# Patient Record
Sex: Male | Born: 1965 | Race: White | Hispanic: No | Marital: Married | Smoking: Never smoker
Health system: Southern US, Community
[De-identification: ages and names within clinical notes are randomized; demographics above are authoritative.]

## PROBLEM LIST (undated history)

## (undated) DIAGNOSIS — K760 Fatty (change of) liver, not elsewhere classified: Secondary | ICD-10-CM

## (undated) DIAGNOSIS — F430 Acute stress reaction: Secondary | ICD-10-CM

## (undated) DIAGNOSIS — K219 Gastro-esophageal reflux disease without esophagitis: Secondary | ICD-10-CM

## (undated) DIAGNOSIS — G4733 Obstructive sleep apnea (adult) (pediatric): Secondary | ICD-10-CM

## (undated) DIAGNOSIS — M545 Low back pain, unspecified: Secondary | ICD-10-CM

## (undated) DIAGNOSIS — E785 Hyperlipidemia, unspecified: Secondary | ICD-10-CM

## (undated) DIAGNOSIS — J309 Allergic rhinitis, unspecified: Secondary | ICD-10-CM

## (undated) DIAGNOSIS — E7800 Pure hypercholesterolemia, unspecified: Secondary | ICD-10-CM

## (undated) DIAGNOSIS — D229 Melanocytic nevi, unspecified: Secondary | ICD-10-CM

## (undated) DIAGNOSIS — D171 Benign lipomatous neoplasm of skin and subcutaneous tissue of trunk: Secondary | ICD-10-CM

## (undated) DIAGNOSIS — M109 Gout, unspecified: Secondary | ICD-10-CM

## (undated) DIAGNOSIS — T7840XA Allergy, unspecified, initial encounter: Secondary | ICD-10-CM

## (undated) DIAGNOSIS — H9190 Unspecified hearing loss, unspecified ear: Secondary | ICD-10-CM

## (undated) HISTORY — PX: TONSILLECTOMY: SUR1361

## (undated) HISTORY — DX: Allergic rhinitis, unspecified: J30.9

## (undated) HISTORY — DX: Gastro-esophageal reflux disease without esophagitis: K21.9

## (undated) HISTORY — DX: Allergy, unspecified, initial encounter: T78.40XA

## (undated) HISTORY — DX: Melanocytic nevi, unspecified: D22.9

## (undated) HISTORY — DX: Unspecified hearing loss, unspecified ear: H91.90

## (undated) HISTORY — DX: Low back pain: M54.5

## (undated) HISTORY — PX: OTHER SURGICAL HISTORY: SHX169

## (undated) HISTORY — PX: HERNIA REPAIR: SHX51

## (undated) HISTORY — DX: Benign lipomatous neoplasm of skin and subcutaneous tissue of trunk: D17.1

## (undated) HISTORY — DX: Obstructive sleep apnea (adult) (pediatric): G47.33

## (undated) HISTORY — DX: Acute stress reaction: F43.0

## (undated) HISTORY — DX: Low back pain, unspecified: M54.50

## (undated) HISTORY — PX: WISDOM TOOTH EXTRACTION: SHX21

## (undated) HISTORY — DX: Gout, unspecified: M10.9

## (undated) HISTORY — DX: Hyperlipidemia, unspecified: E78.5

---

## 1999-11-25 DIAGNOSIS — H9190 Unspecified hearing loss, unspecified ear: Secondary | ICD-10-CM

## 1999-11-25 HISTORY — DX: Unspecified hearing loss, unspecified ear: H91.90

## 2004-07-05 ENCOUNTER — Other Ambulatory Visit: Payer: Self-pay

## 2005-04-07 ENCOUNTER — Emergency Department: Payer: Self-pay | Admitting: Emergency Medicine

## 2006-05-15 ENCOUNTER — Emergency Department: Payer: Self-pay | Admitting: Emergency Medicine

## 2006-05-15 ENCOUNTER — Other Ambulatory Visit: Payer: Self-pay

## 2006-07-06 ENCOUNTER — Encounter: Admission: RE | Admit: 2006-07-06 | Discharge: 2006-07-06 | Payer: Self-pay | Admitting: Gastroenterology

## 2007-06-07 DIAGNOSIS — J309 Allergic rhinitis, unspecified: Secondary | ICD-10-CM | POA: Insufficient documentation

## 2008-10-22 DIAGNOSIS — F430 Acute stress reaction: Secondary | ICD-10-CM | POA: Insufficient documentation

## 2009-06-07 ENCOUNTER — Ambulatory Visit: Payer: Self-pay | Admitting: Family Medicine

## 2013-02-16 ENCOUNTER — Ambulatory Visit: Payer: Self-pay | Admitting: Family Medicine

## 2013-04-07 ENCOUNTER — Ambulatory Visit: Payer: Self-pay | Admitting: Family Medicine

## 2014-02-21 LAB — LIPID PANEL
CHOLESTEROL, TOTAL: 175
HDL: 49 mg/dL (ref 35–70)
LDL Cholesterol: 91 mg/dL
Triglycerides: 176

## 2014-05-09 LAB — BASIC METABOLIC PANEL
BUN: 17 mg/dL (ref 4–21)
CREATININE: 1 mg/dL (ref 0.6–1.3)
GLUCOSE: 113 mg/dL
Potassium: 4.3 mmol/L (ref 3.4–5.3)
Sodium: 140 mmol/L (ref 137–147)

## 2014-05-09 LAB — HEPATIC FUNCTION PANEL
ALT: 23 U/L (ref 10–40)
AST: 25 U/L (ref 14–40)

## 2014-08-21 ENCOUNTER — Ambulatory Visit: Payer: Self-pay | Admitting: Family Medicine

## 2014-11-24 DIAGNOSIS — M109 Gout, unspecified: Secondary | ICD-10-CM

## 2014-11-24 HISTORY — DX: Gout, unspecified: M10.9

## 2015-05-07 ENCOUNTER — Telehealth: Payer: Self-pay

## 2015-05-07 ENCOUNTER — Emergency Department
Admission: EM | Admit: 2015-05-07 | Discharge: 2015-05-07 | Disposition: A | Discharge status: Home / Self Care | Payer: BLUE CROSS/BLUE SHIELD | Attending: Emergency Medicine | Admitting: Emergency Medicine

## 2015-05-07 ENCOUNTER — Emergency Department: Payer: BLUE CROSS/BLUE SHIELD

## 2015-05-07 ENCOUNTER — Other Ambulatory Visit: Payer: Self-pay

## 2015-05-07 ENCOUNTER — Encounter: Payer: Self-pay | Admitting: Emergency Medicine

## 2015-05-07 DIAGNOSIS — M25512 Pain in left shoulder: Secondary | ICD-10-CM

## 2015-05-07 HISTORY — DX: Pure hypercholesterolemia, unspecified: E78.00

## 2015-05-07 LAB — COMPREHENSIVE METABOLIC PANEL
ALBUMIN: 3.9 g/dL (ref 3.5–5.0)
ALT: 22 U/L (ref 17–63)
ANION GAP: 7 (ref 5–15)
AST: 26 U/L (ref 15–41)
Alkaline Phosphatase: 35 U/L — ABNORMAL LOW (ref 38–126)
BILIRUBIN TOTAL: 1.1 mg/dL (ref 0.3–1.2)
BUN: 19 mg/dL (ref 6–20)
CALCIUM: 8.9 mg/dL (ref 8.9–10.3)
CHLORIDE: 111 mmol/L (ref 101–111)
CO2: 22 mmol/L (ref 22–32)
CREATININE: 0.89 mg/dL (ref 0.61–1.24)
GFR calc Af Amer: >60 mL/min (ref >60)
GFR calc non Af Amer: >60 mL/min (ref >60)
Glucose, Bld: 102 mg/dL — ABNORMAL HIGH (ref 65–99)
Potassium: 4.6 mmol/L (ref 3.5–5.1)
Sodium: 140 mmol/L (ref 135–145)
TOTAL PROTEIN: 6.6 g/dL (ref 6.5–8.1)

## 2015-05-07 LAB — CBC
HEMATOCRIT: 49.6 % (ref 40.0–52.0)
HEMOGLOBIN: 16.7 g/dL (ref 13.0–18.0)
MCH: 30.8 pg (ref 26.0–34.0)
MCHC: 33.6 g/dL (ref 32.0–36.0)
MCV: 91.7 fL (ref 80.0–100.0)
PLATELETS: 214 10*3/uL (ref 150–440)
RBC: 5.41 MIL/uL (ref 4.40–5.90)
RDW: 12.8 % (ref 11.5–14.5)
WBC: 6.7 10*3/uL (ref 3.8–10.6)

## 2015-05-07 LAB — TROPONIN I: Troponin I: <0.03 ng/mL (ref <0.031)

## 2015-05-07 MED ORDER — HYDROCODONE-ACETAMINOPHEN 5-325 MG PO TABS: 1.0000 | ORAL_TABLET | ORAL | Status: DC | PRN

## 2015-05-07 NOTE — ED Provider Notes (Signed)
[facility] Emergency Department Provider Note  ____________________________________________  Time seen: 1:30 PM  I have reviewed the triage vital signs and the nursing notes.   HISTORY  Chief Complaint Arm Pain      HPI Russell Pineda is a 49 y.o. male who presents with 1 week of left shoulder pain. Denies chest pain. He reports the pain is intermittent but when it comes it is sharp and moderate to severe. He notes it radiates down his arm. He denies neuro deficits. He denies numbness tingling. No fevers chills. No neck pain. Does report he has been working on his house recently. No shortness of breath. No recent travel. No history of DVTs.     Past Medical History  Diagnosis Date  . Hypercholesterolemia     There are no active problems to display for this patient.   Past Surgical History  Procedure Laterality Date  . Tonsillectomy    . Orchidectomy      left    No current outpatient prescriptions on file.  Allergies Review of patient's allergies indicates no known allergies.  No family history on file.  Social History History  Substance Use Topics  . Smoking status: Never Smoker   . Smokeless tobacco: Not on file  . Alcohol Use: 8.4 oz/week    14 Standard drinks or equivalent per week    Review of Systems  Constitutional: Negative for fever. Eyes: Negative for visual changes. ENT: Negative for sore throat Cardiovascular: Negative for chest pain. Respiratory: Negative for shortness of breath. Gastrointestinal: Negative for abdominal pain, vomiting and diarrhea. Genitourinary: Negative for dysuria. Musculoskeletal: Positive for left shoulder pain Skin: Negative for rash. Neurological: Negative for headaches or focal weakness    10-point ROS otherwise negative.  ____________________________________________   PHYSICAL EXAM:  VITAL SIGNS: ED Triage Vitals  Enc Vitals Group     BP 05/07/15 1055 120/83 mmHg     Pulse  Rate 05/07/15 1055 77     Resp 05/07/15 1055 20     Temp 05/07/15 1055 98 F (36.7 C)     Temp Source 05/07/15 1055 Oral     SpO2 05/07/15 1055 95 %     Weight 05/07/15 1055 235 lb (106.595 kg)     Height 05/07/15 1055 6' (1.829 m)     Head Cir --      Peak Flow --      Pain Score 05/07/15 1056 5     Pain Loc --      Pain Edu? --      Excl. in [place]? --      Constitutional: Alert and oriented. Well appearing and in no distress. Eyes: Conjunctivae are normal. PERRL. ENT   Head: Normocephalic and atraumatic.   Nose: No rhinnorhea.   Mouth/Throat: Mucous membranes are moist. Cardiovascular: Normal rate, regular rhythm. Normal and symmetric distal pulses are present in all extremities. No murmurs, rubs, or gallops. Respiratory: Normal respiratory effort without tachypnea nor retractions. Breath sounds are clear and equal bilaterally.  Gastrointestinal: Soft and non-tender in all quadrants. No distention. There is no CVA tenderness. Genitourinary: deferred Musculoskeletal: Nontender with normal range of motion in all extremities. No lower extremity tenderness nor edema. Neurologic:  Normal speech and language. No gross focal neurologic deficits are appreciated. Skin:  Skin is warm, dry and intact. No rash noted.  Psychiatric: Mood and affect are normal. Patient exhibits appropriate insight and judgment.  ____________________________________________    LABS (pertinent positives/negatives)  Labs Reviewed  COMPREHENSIVE  METABOLIC PANEL - Abnormal; Notable for the following:    Glucose, Bld 102 (*)    Alkaline Phosphatase 35 (*)    All other components within normal limits  CBC  TROPONIN I    ____________________________________________   EKG  ED ECG REPORT I, Lavonia Drafts, the attending physician, personally viewed and interpreted this ECG.  Date: 05/07/2015 EKG Time: 10:59 AM Rate: 77 Rhythm: normal sinus rhythm QRS Axis: normal Intervals: normal ST/T Wave  abnormalities: normal Conduction Disutrbances: none Narrative Interpretation: unremarkable   ____________________________________________    RADIOLOGY  Chest x-ray reviewed by me, no acute distress  ____________________________________________   PROCEDURES  Procedure(s) performed: none  Critical Care performed: none  ____________________________________________   INITIAL IMPRESSION / ASSESSMENT AND PLAN / ED COURSE  Pertinent labs & imaging results that were available during my care of the patient were reviewed by me and considered in my medical decision making (see chart for details).  Patient well-appearing. History of present illness exam, EKG, labs not consistent with ACS. No pleurisy no tachycardia. No chest pain at all. This appears to be a primary musculoskeletal issue perhaps related to the rotator cuff. They  will follow up with his PCP in one day. Return precautions discussed  ____________________________________________   FINAL CLINICAL IMPRESSION(S) / ED DIAGNOSES  Final diagnoses:  Shoulder pain, acute, left     Lavonia Drafts, MD 05/07/15 1345

## 2015-05-07 NOTE — Telephone Encounter (Signed)
Patient son contacted Boykin Nearing advising her that his father has been having pain in his shoulder that is radiating down his arm. I called and spoke to pt to triage the situation and get more detail. Patient complains of new onset pain in his left arm that radiates through his back. Patient describes the pain as a sharp stabbing pain. The pain worsened yesterday and patient rated the pain 10/10. Right now patient rates the pain 5/10. Patient states he was light headed yesterday also.  Patient denies chest pain,  shortness of breath, nausea or vomiting. Patient was advised that he should go to the ER for evaluation. Patient agrees and states he will have his son take him who was currently with his at that time.

## 2015-05-07 NOTE — Discharge Instructions (Signed)
Arthritis, Nonspecific °Arthritis is inflammation of a joint. This usually means pain, redness, warmth or swelling are present. One or more joints may be involved. There are a number of types of arthritis. Your caregiver may not be able to tell what type of arthritis you have right away. °CAUSES  °The most common cause of arthritis is the wear and tear on the joint (osteoarthritis). This causes damage to the cartilage, which can break down over time. The knees, hips, back and neck are most often affected by this type of arthritis. °Other types of arthritis and common causes of joint pain include: °· Sprains and other injuries near the joint. Sometimes minor sprains and injuries cause pain and swelling that develop hours later. °· Rheumatoid arthritis. This affects hands, feet and knees. It usually affects both sides of your body at the same time. It is often associated with chronic ailments, fever, weight loss and general weakness. °· Crystal arthritis. Gout and pseudo gout can cause occasional acute severe pain, redness and swelling in the foot, ankle, or knee. °· Infectious arthritis. Bacteria can get into a joint through a break in overlying skin. This can cause infection of the joint. Bacteria and viruses can also spread through the blood and affect your joints. °· Drug, infectious and allergy reactions. Sometimes joints can become mildly painful and slightly swollen with these types of illnesses. °SYMPTOMS  °· Pain is the main symptom. °· Your joint or joints can also be red, swollen and warm or hot to the touch. °· You may have a fever with certain types of arthritis, or even feel overall ill. °· The joint with arthritis will hurt with movement. Stiffness is present with some types of arthritis. °DIAGNOSIS  °Your caregiver will suspect arthritis based on your description of your symptoms and on your exam. Testing may be needed to find the type of arthritis: °· Blood and sometimes urine tests. °· X-ray tests  and sometimes CT or MRI scans. °· Removal of fluid from the joint (arthrocentesis) is done to check for bacteria, crystals or other causes. Your caregiver (or a specialist) will numb the area over the joint with a local anesthetic, and use a needle to remove joint fluid for examination. This procedure is only minimally uncomfortable. °· Even with these tests, your caregiver may not be able to tell what kind of arthritis you have. Consultation with a specialist (rheumatologist) may be helpful. °TREATMENT  °Your caregiver will discuss with you treatment specific to your type of arthritis. If the specific type cannot be determined, then the following general recommendations may apply. °Treatment of severe joint pain includes: °· Rest. °· Elevation. °· Anti-inflammatory medication (for example, ibuprofen) may be prescribed. Avoiding activities that cause increased pain. °· Only take over-the-counter or prescription medicines for pain and discomfort as recommended by your caregiver. °· Cold packs over an inflamed joint may be used for 10 to 15 minutes every hour. Hot packs sometimes feel better, but do not use overnight. Do not use hot packs if you are diabetic without your caregiver's permission. °· A cortisone shot into arthritic joints may help reduce pain and swelling. °· Any acute arthritis that gets worse over the next 1 to 2 days needs to be looked at to be sure there is no joint infection. °Long-term arthritis treatment involves modifying activities and lifestyle to reduce joint stress jarring. This can include weight loss. Also, exercise is needed to nourish the joint cartilage and remove waste. This helps keep the muscles   around the joint strong. °HOME CARE INSTRUCTIONS  °· Do not take aspirin to relieve pain if gout is suspected. This elevates uric acid levels. °· Only take over-the-counter or prescription medicines for pain, discomfort or fever as directed by your caregiver. °· Rest the joint as much as  possible. °· If your joint is swollen, keep it elevated. °· Use crutches if the painful joint is in your leg. °· Drinking plenty of fluids may help for certain types of arthritis. °· Follow your caregiver's dietary instructions. °· Try low-impact exercise such as: °¨ Swimming. °¨ Water aerobics. °¨ Biking. °¨ Walking. °· Morning stiffness is often relieved by a warm shower. °· Put your joints through regular range-of-motion. °SEEK MEDICAL CARE IF:  °· You do not feel better in 24 hours or are getting worse. °· You have side effects to medications, or are not getting better with treatment. °SEEK IMMEDIATE MEDICAL CARE IF:  °· You have a fever. °· You develop severe joint pain, swelling or redness. °· Many joints are involved and become painful and swollen. °· There is severe back pain and/or leg weakness. °· You have loss of bowel or bladder control. °Document Released: 12/18/2004 Document Revised: 02/02/2012 Document Reviewed: 01/03/2009 °[employer]® Patient Information ©2015 [employer]. This information is not intended to replace advice given to you by your health care provider. Make sure you discuss any questions you have with your health care provider. ° °

## 2015-05-07 NOTE — ED Notes (Signed)
Denies injury, states cannot reproduce by moving it, denies SOB, states pain is L arm, shoulder, side and back

## 2015-05-07 NOTE — ED Notes (Signed)
MD Kinner at bedside  

## 2015-05-08 ENCOUNTER — Ambulatory Visit
Admission: RE | Admit: 2015-05-08 | Discharge: 2015-05-08 | Disposition: A | Discharge status: Home / Self Care | Payer: BLUE CROSS/BLUE SHIELD | Source: Ambulatory Visit | Attending: Family Medicine | Admitting: Family Medicine

## 2015-05-08 ENCOUNTER — Encounter: Payer: Self-pay | Admitting: Family Medicine

## 2015-05-08 ENCOUNTER — Encounter: Payer: Self-pay | Admitting: *Deleted

## 2015-05-08 ENCOUNTER — Ambulatory Visit (INDEPENDENT_AMBULATORY_CARE_PROVIDER_SITE_OTHER): Payer: BLUE CROSS/BLUE SHIELD | Admitting: Family Medicine

## 2015-05-08 VITALS — BP 110/76 | HR 80 | Temp 97.2°F | Resp 16 | Wt 241.0 lb

## 2015-05-08 DIAGNOSIS — G4710 Hypersomnia, unspecified: Secondary | ICD-10-CM | POA: Insufficient documentation

## 2015-05-08 DIAGNOSIS — M25512 Pain in left shoulder: Secondary | ICD-10-CM | POA: Insufficient documentation

## 2015-05-08 DIAGNOSIS — M7552 Bursitis of left shoulder: Secondary | ICD-10-CM

## 2015-05-08 DIAGNOSIS — S39012A Strain of muscle, fascia and tendon of lower back, initial encounter: Secondary | ICD-10-CM | POA: Insufficient documentation

## 2015-05-08 DIAGNOSIS — R252 Cramp and spasm: Secondary | ICD-10-CM | POA: Insufficient documentation

## 2015-05-08 DIAGNOSIS — D229 Melanocytic nevi, unspecified: Secondary | ICD-10-CM | POA: Insufficient documentation

## 2015-05-08 DIAGNOSIS — G4733 Obstructive sleep apnea (adult) (pediatric): Secondary | ICD-10-CM | POA: Insufficient documentation

## 2015-05-08 DIAGNOSIS — R079 Chest pain, unspecified: Secondary | ICD-10-CM | POA: Diagnosis not present

## 2015-05-08 DIAGNOSIS — K120 Recurrent oral aphthae: Secondary | ICD-10-CM | POA: Insufficient documentation

## 2015-05-08 DIAGNOSIS — R0683 Snoring: Secondary | ICD-10-CM | POA: Insufficient documentation

## 2015-05-08 DIAGNOSIS — M5442 Lumbago with sciatica, left side: Secondary | ICD-10-CM | POA: Insufficient documentation

## 2015-05-08 DIAGNOSIS — D171 Benign lipomatous neoplasm of skin and subcutaneous tissue of trunk: Secondary | ICD-10-CM | POA: Insufficient documentation

## 2015-05-08 MED ORDER — METHOCARBAMOL 750 MG PO TABS: ORAL_TABLET | ORAL | Status: DC

## 2015-05-08 MED ORDER — NABUMETONE 750 MG PO TABS: 1500.0000 mg | ORAL_TABLET | Freq: Every day | ORAL | Status: DC

## 2015-05-08 NOTE — Progress Notes (Signed)
Patient: Russell Pineda Male    DOB: 1966-07-10   49 y.o.   MRN: [id number] Visit Date: 05/08/2015  Today's Provider: Lelon Huh, MD   Chief Complaint  Patient presents with  . Shoulder Pain    Hospital Follow up   Subjective:    Shoulder Pain  The pain is present in the left shoulder. This is a new problem. Episode onset: 1 week ago. There has been no history of extremity trauma. The problem occurs intermittently. The quality of the pain is described as sharp. Pertinent negatives include no fever or numbness.  Pain sometimes radiates down into his arm. Patient was seen at [facility] ER yesterday for shoulder pain. Labs and imaging including chest XR and abdominal ultrasound were ordered and were normal. Cardiac enzymes and EKG were normal. Patient was determined to be  to a musculoskeletal issue pehraps related to Rotaror cuff. Patient was prescribed Vicodin to help with pain.  Today patient comes instating pain is no better. Patient woke up this morning feeling light headed and nauseous.   Chest pain Pain above was also associated with pain in chest which is not exertional. However there is a strong family history of early CAD and concerned about possibility of underlying cardiac disease.     Lab Results  Component Value Date   CHOL 175 02/21/2014   HDL 49 02/21/2014   LDLCALC 91 02/21/2014   TRIG 176 02/21/2014   family history includes Cancer in his maternal grandmother and paternal grandfather; Emphysema in his mother; Heart attack in his father; Heart disease in his father.  Previous Medications   CHOLINE FENOFIBRATE (FENOFIBRIC ACID) 135 MG CPDR    Take by mouth.   DIAZEPAM (VALIUM) 5 MG TABLET    Take by mouth.   FLUOCINONIDE CREAM (LIDEX) 0.05 %       FLUTICASONE (FLONASE) 50 MCG/ACT NASAL SPRAY    Place into the nose.   HYDROCODONE-ACETAMINOPHEN (NORCO/VICODIN) 5-325 MG PER TABLET    Take 1 tablet by mouth every 4 (four) hours as needed for moderate pain.   NAPROXEN  (NAPROSYN) 500 MG TABLET    Take by mouth.   PANTOPRAZOLE (PROTONIX) 40 MG TABLET    Take by mouth.    Review of Systems  Constitutional: Negative for fever, chills, diaphoresis and fatigue.  Cardiovascular: Negative for chest pain, palpitations and leg swelling.  Gastrointestinal: Positive for nausea. Negative for vomiting.  Musculoskeletal: Positive for myalgias (left shoulder), back pain, neck pain and neck stiffness.  Neurological: Positive for light-headedness. Negative for dizziness, tremors, seizures, syncope, speech difficulty, weakness, numbness and headaches.   Patient Active Problem List   Diagnosis Date Noted  . Atypical chest pain 05/10/2015  . Excessive sleepiness 05/08/2015  . Left-sided low back pain with left-sided sciatica 05/08/2015  . Lipoma of abdominal wall 05/08/2015  . Low back strain 05/08/2015  . Multiple benign nevi 05/08/2015  . OSA (obstructive sleep apnea) 05/08/2015  . Snoring 05/08/2015  . Lumbago 06/07/2009  . Acute stress disorder 10/22/2008  . Allergic rhinitis 06/07/2007  . Esophageal reflux 06/07/2007  . Hyperlipemia, mixed 11/24/2006    History  Substance Use Topics  . Smoking status: Never Smoker   . Smokeless tobacco: Former Systems developer    Types: Chew    Quit date: 11/24/2004  . Alcohol Use: 8.4 oz/week    14 Standard drinks or equivalent per week   Objective:   BP 110/76 mmHg  Pulse 80  Temp(Src) 97.2 F (36.2 C) (Oral)  Resp 16  Wt 241 lb (109.317 kg)  SpO2 96%  Physical Exam  General Appearance:    Alert, cooperative, no distress  Eyes:    PERRL, conjunctiva/corneas clear, EOM's intact       Lungs:     Clear to auscultation bilaterally, respirations unlabored  Heart:    Regular rate and rhythm  Neurologic:   Awake, alert, oriented x 3. No apparent focal neurological           defect.   MS:  Tender along left superior scapula posterior shoulder and left paracervical muscles. FROM neck. Some pain with internal shoulder rotation.         Assessment & Plan:     1. Bursitis, shoulder, left  - DG Shoulder Left; Future - nabumetone (RELAFEN) 750 MG tablet; Take 2 tablets (1,500 mg total) by mouth daily.  Dispense: 30 tablet; Refill: 1 - methocarbamol (ROBAXIN-750) 750 MG tablet; 1-2 tablets every six hours as needed  Dispense: 30 tablet; Refill: 1  2. Chest pain, unspecified chest pain type He and his wife very concerned about possibly underlying cardiac disease as his father had heart attack in his early 13s.  - Ambulatory referral to Cardiology

## 2015-05-10 ENCOUNTER — Encounter: Payer: Self-pay | Admitting: Cardiovascular Disease

## 2015-05-10 ENCOUNTER — Ambulatory Visit (INDEPENDENT_AMBULATORY_CARE_PROVIDER_SITE_OTHER): Payer: BLUE CROSS/BLUE SHIELD | Admitting: Cardiovascular Disease

## 2015-05-10 VITALS — BP 118/84 | HR 74 | Ht 71.0 in | Wt 241.5 lb

## 2015-05-10 DIAGNOSIS — E782 Mixed hyperlipidemia: Secondary | ICD-10-CM

## 2015-05-10 DIAGNOSIS — R0789 Other chest pain: Secondary | ICD-10-CM | POA: Insufficient documentation

## 2015-05-10 NOTE — Assessment & Plan Note (Signed)
He used to be on a statin in the past but not anymore. I definitely think that it would help to know if there is evidence of atherosclerosis. I discussed with him coronary calcium scoring and he wants to proceed with this. If he has evidence of atherosclerosis, then we should consider more aggressive treatment of hyperlipidemia.

## 2015-05-10 NOTE — Patient Instructions (Signed)
Medication Instructions:  Please continue current medications  Labwork: None  Testing/Procedures: Your physician has requested that you have an exercise tolerance test.   You have been scheduled for a CT Cardiac Score in our [facility]  Tuesday, June 21 @ 10:30 (please arrive @ 10:15) There is a one-time fee of $150.00 due at the time of your procedure  Follow-Up: Call or return to clinic prn if these symptoms worsen or fail to improve as anticipated.  Exercise Stress Electrocardiogram An exercise stress electrocardiogram is a test that is done to evaluate the blood supply to your heart. This test may also be called exercise stress electrocardiography. The test is done while you are walking on a treadmill. The goal of this test is to raise your heart rate. This test is done to find areas of poor blood flow to the heart by determining the extent of coronary artery disease (CAD).   CAD is defined as narrowing in one or more heart (coronary) arteries of more than 70%. If you have an abnormal test result, this may mean that you are not getting adequate blood flow to your heart during exercise. Additional testing may be needed to understand why your test was abnormal. LET [facility] CARE PROVIDER KNOW ABOUT:   Any allergies you have.  All medicines you are taking, including vitamins, herbs, eye drops, creams, and over-the-counter medicines.  Previous problems you or members of your family have had with the use of anesthetics.  Any blood disorders you have.  Previous surgeries you have had.  Medical conditions you have.  Possibility of pregnancy, if this applies. RISKS AND COMPLICATIONS Generally, this is a safe procedure. However, as with any procedure, complications can occur. Possible complications can include:  Pain or pressure in the following areas:  Chest.  Jaw or neck.  Between your shoulder blades.  Radiating down your left arm.  Dizziness or  light-headedness.  Shortness of breath.  Increased or irregular heartbeats.  Nausea or vomiting.  Heart attack (rare). BEFORE THE PROCEDURE  Avoid all forms of caffeine 24 hours before your test or as directed by your health care provider. This includes coffee, tea (even decaffeinated tea), caffeinated sodas, chocolate, cocoa, and certain pain medicines.  Follow your health care provider's instructions regarding eating and drinking before the test.  Take your medicines as directed at regular times with water unless instructed otherwise. Exceptions may include:  If you have diabetes, ask how you are to take your insulin or pills. It is common to adjust insulin dosing the morning of the test.  If you are taking beta-blocker medicines, it is important to talk to your health care provider about these medicines well before the date of your test. Taking beta-blocker medicines may interfere with the test. In some cases, these medicines need to be changed or stopped 24 hours or more before the test.  If you wear a nitroglycerin patch, it may need to be removed prior to the test. Ask your health care provider if the patch should be removed before the test.  If you use an inhaler for any breathing condition, bring it with you to the test.  If you are an outpatient, bring a snack so you can eat right after the stress phase of the test.  Do not smoke for 4 hours prior to the test or as directed by your health care provider.  Do not apply lotions, powders, creams, or oils on your chest prior to the test.  Wear loose-fitting clothes  and comfortable shoes for the test. This test involves walking on a treadmill. PROCEDURE  Multiple patches (electrodes) will be put on your chest. If needed, small areas of your chest may have to be shaved to get better contact with the electrodes. Once the electrodes are attached to your body, multiple wires will be attached to the electrodes and your heart rate will  be monitored.  Your heart will be monitored both at rest and while exercising.  You will walk on a treadmill. The treadmill will be started at a slow pace. The treadmill speed and incline will gradually be increased to raise your heart rate. AFTER THE PROCEDURE  Your heart rate and blood pressure will be monitored after the test.  You may return to your normal schedule including diet, activities, and medicines, unless your health care provider tells you otherwise. Document Released: 11/07/2000 Document Revised: 11/15/2013 Document Reviewed: 07/18/2013 [facility] Patient Information 2015 [place], [place]. This information is not intended to replace advice given to you by your health care provider. Make sure you discuss any questions you have with your health care provider.

## 2015-05-10 NOTE — Progress Notes (Signed)
Primary care physician: Dr. Caryn Section.  HPI  This is a pleasant 49 year old male who was referred for evaluation of atypical chest pain. He has no previous cardiac history. He has known history of hyperlipidemia, family history of premature coronary artery disease and obesity. he was previously treated with a statin. His father died at the age of 22 of myocardial infarction and his uncle died also in his 27s of myocardial infarction. The patient is not a smoker. Over the last 2 weeks, he has experienced left shoulder pain occasionally going to the left upper chest area. This is usually worse with certain movements. It does not seem to be related to physical activities. He does complain of chronic exertional dyspnea with no orthopnea, PND or lower extremity edema. He went to the emergency room recently for these complaints. Both chest and shoulder x-rays were negative. He is suspected of having bursitis but given his other associated symptoms and family history, he was referred for further cardiac evaluation.   Allergies  Allergen Reactions  . Niacin     Flushing     Current Outpatient Prescriptions on File Prior to Visit  Medication Sig Dispense Refill  . amitriptyline (ELAVIL) 25 MG tablet Take 25 mg by mouth at bedtime as needed.     . fluocinonide cream (LIDEX) 3.29 % Apply 1 application topically as needed.     . fluticasone (FLONASE) 50 MCG/ACT nasal spray Place into the nose as needed.     . methocarbamol (ROBAXIN-750) 750 MG tablet 1-2 tablets every six hours as needed 30 tablet 1  . nabumetone (RELAFEN) 750 MG tablet Take 2 tablets (1,500 mg total) by mouth daily. 30 tablet 1  . pantoprazole (PROTONIX) 40 MG tablet Take 40 mg by mouth daily.      No current facility-administered medications on file prior to visit.     Past Medical History  Diagnosis Date  . Hypercholesterolemia   . Lipoma of abdominal wall   . Multiple benign nevi   . Hyperlipidemia   . Acute stress disorder     . OSA (obstructive sleep apnea)   . Allergic rhinitis   . Esophageal reflux   . Lumbago      Past Surgical History  Procedure Laterality Date  . Tonsillectomy    . Orchidectomy      left  . Hernia repair       Family History  Problem Relation Age of Onset  . Emphysema Mother   . Heart disease Father   . Heart attack Father   . Cancer Maternal Grandmother     Bone  . Cancer Paternal Grandfather      History   Social History  . Marital Status: Married    Spouse Name: N/A  . Number of Children: N/A  . Years of Education: HS Grad   Occupational History  . Full-Time     [employer]   Social History Main Topics  . Smoking status: Never Smoker   . Smokeless tobacco: Former Systems developer    Types: Chew    Quit date: 11/24/2004  . Alcohol Use: 8.4 oz/week    14 Standard drinks or equivalent per week  . Drug Use: No  . Sexual Activity: Not on file   Other Topics Concern  . Not on file   Social History Narrative     ROS A 10 point review of system was performed. It is negative other than that mentioned in the history of present illness.   PHYSICAL EXAM  BP 118/84 mmHg  Pulse 74  Ht 5\' 11"  (1.803 m)  Wt 241 lb 8 oz (109.544 kg)  BMI 33.70 kg/m2 Constitutional: He is oriented to person, place, and time. He appears well-developed and well-nourished. No distress.  HENT: No nasal discharge.  Head: Normocephalic and atraumatic.  Eyes: Pupils are equal and round.  No discharge. Neck: Normal range of motion. Neck supple. No JVD present. No thyromegaly present.  Cardiovascular: Normal rate, regular rhythm, normal heart sounds. Exam reveals no gallop and no friction rub. No murmur heard.  Pulmonary/Chest: Effort normal and breath sounds normal. No stridor. No respiratory distress. He has no wheezes. He has no rales. He exhibits no tenderness.  Abdominal: Soft. Bowel sounds are normal. He exhibits no distension. There is no tenderness. There is no rebound and no  guarding.  Musculoskeletal: Normal range of motion. He exhibits no edema and no tenderness.  Neurological: He is alert and oriented to person, place, and time. Coordination normal.  Skin: Skin is warm and dry. No rash noted. He is not diaphoretic. No erythema. No pallor.  Psychiatric: He has a normal mood and affect. His behavior is normal. Judgment and thought content normal.       HUO:HFGBM  Rhythm  WITHIN NORMAL LIMITS   ASSESSMENT AND PLAN

## 2015-05-10 NOTE — Assessment & Plan Note (Signed)
The left shoulder pain appears to be musculoskeletal. However, he has associated symptoms that include exertional dyspnea with strong family history of coronary artery disease. Thus, I requested evaluation with a treadmill stress test. I discussed with the patient the importance of lifestyle changes in order to decrease the chance of future coronary artery disease and cardiovascular events. We discussed the importance of controlling risk factors, healthy diet as well as regular exercise. I also explained to him that a normal stress test does not rule out atherosclerosis.

## 2015-05-15 ENCOUNTER — Ambulatory Visit (INDEPENDENT_AMBULATORY_CARE_PROVIDER_SITE_OTHER)
Admission: RE | Admit: 2015-05-15 | Discharge: 2015-05-15 | Disposition: A | Discharge status: Home / Self Care | Payer: BLUE CROSS/BLUE SHIELD | Source: Ambulatory Visit | Attending: Cardiovascular Disease | Admitting: Cardiovascular Disease

## 2015-05-15 DIAGNOSIS — R0789 Other chest pain: Secondary | ICD-10-CM

## 2015-06-05 ENCOUNTER — Other Ambulatory Visit: Payer: Self-pay | Admitting: Family Medicine

## 2015-06-14 ENCOUNTER — Encounter: Payer: BLUE CROSS/BLUE SHIELD | Admitting: Cardiovascular Disease

## 2015-08-28 ENCOUNTER — Ambulatory Visit (INDEPENDENT_AMBULATORY_CARE_PROVIDER_SITE_OTHER): Payer: BLUE CROSS/BLUE SHIELD | Admitting: Family Medicine

## 2015-08-28 ENCOUNTER — Encounter: Payer: Self-pay | Admitting: Family Medicine

## 2015-08-28 VITALS — BP 112/78 | HR 70 | Temp 98.3°F | Resp 16 | Ht 71.5 in | Wt 244.4 lb

## 2015-08-28 DIAGNOSIS — J029 Acute pharyngitis, unspecified: Secondary | ICD-10-CM | POA: Diagnosis not present

## 2015-08-28 DIAGNOSIS — K219 Gastro-esophageal reflux disease without esophagitis: Secondary | ICD-10-CM | POA: Diagnosis not present

## 2015-08-28 DIAGNOSIS — J301 Allergic rhinitis due to pollen: Secondary | ICD-10-CM

## 2015-08-28 MED ORDER — SUCRALFATE 1 G PO TABS: 1.0000 g | ORAL_TABLET | Freq: Three times a day (TID) | ORAL | Status: DC

## 2015-08-28 NOTE — Patient Instructions (Signed)
Increase fluticasone spray to two squirts each nasally daily

## 2015-08-28 NOTE — Progress Notes (Signed)
Subjective:     Patient ID: Russell Pineda, male   DOB: 03-13-1966, 49 y.o.   MRN: [id number]  HPI  Chief Complaint  Patient presents with  . Sore Throat    Patient comes in office today with concerns of sore throat intermittent over the past 3 days. Associated symptoms include: sinus drainage, itchy ears, redness and itching areound neck and pain when swallowing solid food. Patient denies fever or exposure to strep.   Reports he does not feel particularly ill. Has been using fluticasone intermittently and Zyrtec daily. Reports compliance with Protonix. Accompanied by his wife today.   Review of Systems  Constitutional: Negative for fever, chills and fatigue.       Objective:   Physical Exam  Constitutional: He appears well-developed and well-nourished.  Ears: T.M's intact without inflammation Throat: no erythema, tonsils absent Neck: no cervical adenopathy Lungs: clear     Assessment:    1. Pharyngitis? Mediated by allergies and/or nocturnal reflux.  2. Allergic rhinitis due to polle  3. Gastroesophageal reflux disease, esophagitis presence not specified - sucralfate (CARAFATE) 1 G tablet; Take 1 tablet (1 g total) by mouth 4 (four) times daily -  with meals and at bedtime.  Dispense: 28 tablet; Refill: 0    Plan:    Increase fluticasone to daily.Discussed episodic use of PPI's or Zantac once his throat feels better.

## 2015-09-03 ENCOUNTER — Other Ambulatory Visit: Payer: Self-pay | Admitting: Family Medicine

## 2015-10-16 ENCOUNTER — Other Ambulatory Visit: Payer: Self-pay | Admitting: Family Medicine

## 2015-10-16 NOTE — Telephone Encounter (Signed)
Tommi Emery called stating Russell Pineda has a "cold, sinus drainage and a sore throat".   She wants to know if you would call him in an RX for Amoxicillin since he is coming up to [place], [place] tomorrow to be with her family and she has a dad 8 whose immune system is very low.    [facility] [address]    Please let Maudie Mercury know your decision.

## 2015-10-16 NOTE — Telephone Encounter (Signed)
LMOVM for pt to return call 

## 2015-10-16 NOTE — Telephone Encounter (Signed)
Patient wife Renold Don and verbally voiced understanding.

## 2015-10-16 NOTE — Telephone Encounter (Signed)
Sounds like a cold virus which amoxicillin would not help with. Antibiotic would not protect her dad from catching this either. If he feels like he has bacterial infection he can make an appointment with Mikki Santee this afternoon to get checked out.

## 2015-10-17 ENCOUNTER — Ambulatory Visit (INDEPENDENT_AMBULATORY_CARE_PROVIDER_SITE_OTHER): Payer: BLUE CROSS/BLUE SHIELD | Admitting: Family Medicine

## 2015-10-17 ENCOUNTER — Encounter: Payer: Self-pay | Admitting: Family Medicine

## 2015-10-17 VITALS — BP 100/68 | HR 71 | Temp 98.4°F | Resp 16 | Wt 246.0 lb

## 2015-10-17 DIAGNOSIS — J069 Acute upper respiratory infection, unspecified: Secondary | ICD-10-CM

## 2015-10-17 MED ORDER — AMOXICILLIN-POT CLAVULANATE 875-125 MG PO TABS: 1.0000 | ORAL_TABLET | Freq: Two times a day (BID) | ORAL | Status: DC

## 2015-10-17 NOTE — Patient Instructions (Signed)
Continue Nyquil and add Mucinex D. May use Delsym as needed for cough. Start antibiotic if sinuses not improving by Sunday.

## 2015-10-17 NOTE — Progress Notes (Signed)
Subjective:     Patient ID: Russell Pineda, male   DOB: 1966-08-12, 49 y.o.   MRN: [id number]  HPI  Chief Complaint  Patient presents with  . Sinus Problem    Patient comes in office today with concerns of sinus pain and pressure since Sunday 10/14/15. Patient reports that pains located below his eyes, associated symptoms include; bilateral ear pain, post nasal drip and productive cough. Patient has been taking otc Benadryl, Airborne and Nightquil  States his allergies were under control before onset of codl sx on 11/20. No other family members ill at this time.   Review of Systems  Constitutional: Negative for fever and chills.       Objective:   Physical Exam  Constitutional: He appears well-developed and well-nourished. No distress.  Ears: T.M's intact without inflammation Sinuses: mild maxillary sinus tenderness Throat: tonsils absent, mild posterior pharyngeal erythema Neck: no cervical adenopathy Lungs: clear     Assessment:    1. Upper respiratory infection - amoxicillin-clavulanate (AUGMENTIN) 875-125 MG tablet; Take 1 tablet by mouth 2 (two) times daily.  Dispense: 20 tablet; Refill: 0    Plan:    Discussed use of Mucinex D for congestion, Delsym for cough, and continue Nyquil at night. Start antibiotic if sinuses not improving on or about 11/27.

## 2016-02-23 ENCOUNTER — Other Ambulatory Visit: Payer: Self-pay | Admitting: Family Medicine

## 2016-02-26 DIAGNOSIS — H40003 Preglaucoma, unspecified, bilateral: Secondary | ICD-10-CM | POA: Diagnosis not present

## 2016-04-10 ENCOUNTER — Encounter: Payer: Self-pay | Admitting: Physician Assistant

## 2016-04-10 ENCOUNTER — Ambulatory Visit (INDEPENDENT_AMBULATORY_CARE_PROVIDER_SITE_OTHER): Payer: BLUE CROSS/BLUE SHIELD | Admitting: Physician Assistant

## 2016-04-10 VITALS — BP 120/70 | HR 75 | Temp 98.1°F | Resp 16

## 2016-04-10 DIAGNOSIS — M7661 Achilles tendinitis, right leg: Secondary | ICD-10-CM

## 2016-04-10 MED ORDER — ETODOLAC 400 MG PO TABS: 400.0000 mg | ORAL_TABLET | Freq: Two times a day (BID) | ORAL | Status: DC

## 2016-04-10 NOTE — Patient Instructions (Signed)

## 2016-04-10 NOTE — Progress Notes (Signed)
Patient: Russell Pineda Male    DOB: 1966/05/27   50 y.o.   MRN: [id number] Visit Date: 04/10/2016  Today's Provider: Mar Daring, PA-C   Chief Complaint  Patient presents with  . Foot Pain   Subjective:    Foot Pain This is a new (right foot) problem. The current episode started in the past 7 days (Injured his right foot Tuesday. On his.He cannot recall what he didi to it.). The problem occurs constantly. The problem has been gradually worsening. Associated symptoms include arthralgias (right heel/ankle) and joint swelling. Pertinent negatives include no numbness or weakness. Associated symptoms comments: Pain is sharp and burning. The symptoms are aggravated by walking and standing (Moving). He has tried nothing (Keeping it elevated) for the symptoms. The treatment provided no relief.       Allergies  Allergen Reactions  . Niacin     Flushing   Previous Medications   AMITRIPTYLINE (ELAVIL) 25 MG TABLET    TAKE 1 TABLET EVERY DAY   AMOXICILLIN-CLAVULANATE (AUGMENTIN) 875-125 MG TABLET    Take 1 tablet by mouth 2 (two) times daily.   CETIRIZINE (ZYRTEC) 10 MG TABLET    Take 10 mg by mouth daily.   FLUOCINONIDE CREAM (LIDEX) 0.05 %    Apply 1 application topically as needed.    FLUTICASONE (FLONASE) 50 MCG/ACT NASAL SPRAY    USE 1 SPRAY IN EACH NOSTRIL DAILY   NABUMETONE (RELAFEN) 750 MG TABLET    TAKE 2 TABLETS (1,500 MG TOTAL) BY MOUTH DAILY.   OMEGA-3 FATTY ACIDS (FISH OIL PO)    Take by mouth daily.   PANTOPRAZOLE (PROTONIX) 40 MG TABLET    Take 40 mg by mouth daily.     Review of Systems  Constitutional: Negative.   Respiratory: Negative.   Cardiovascular: Negative.   Musculoskeletal: Positive for joint swelling, arthralgias (right heel/ankle) and gait problem.  Neurological: Negative for weakness and numbness.    Social History  Substance Use Topics  . Smoking status: Never Smoker   . Smokeless tobacco: Former Systems developer    Types: Chew    Quit date:  11/24/2004  . Alcohol Use: 8.4 oz/week    14 Standard drinks or equivalent per week   Objective:   BP 120/70 mmHg  Pulse 75  Temp(Src) 98.1 F (36.7 C) (Oral)  Resp 16  Wt   Physical Exam  Constitutional: He appears well-developed and well-nourished. No distress.  HENT:  Head: Normocephalic and atraumatic.  Neck: Normal range of motion. Neck supple.  Cardiovascular: Normal rate, regular rhythm and normal heart sounds.  Exam reveals no gallop and no friction rub.   No murmur heard. Pulmonary/Chest: Effort normal and breath sounds normal. No respiratory distress. He has no wheezes. He has no rales.  Musculoskeletal:       Right ankle: He exhibits swelling (over achilles tendon insertion on calcaneous). He exhibits normal range of motion, no ecchymosis, no deformity and normal pulse. Tenderness (calcaneal insertion of achilles). Achilles tendon exhibits pain. Achilles tendon exhibits no defect and normal Thompson's test results.       Left ankle: Normal.  Skin: He is not diaphoretic.  Vitals reviewed.       Assessment & Plan:     1. Achilles tendinitis of right lower extremity Achilles tendinitis at the insertion on the right calcaneus. I do not feel there is a bursitis at this time being that there is no true inflammation noted, just mild swelling. We'll  give Lodine as below for inflammation. Instructed on stretches and exercises to help. I do feel this is secondary to his still toed boots that he has to wear for work. Advised that he may also benefit by putting a heating pad over the area as well. Instructed this can take a long time to heal as it is really aggravated frequently with walking. Discussed that if it does not improve may consider referral to podiatry for further evaluation and treatment. - etodolac (LODINE) 400 MG tablet; Take 1 tablet (400 mg total) by mouth 2 (two) times daily.  Dispense: 28 tablet; Refill: 0       Mar Daring, PA-C  [facility]

## 2016-04-18 ENCOUNTER — Other Ambulatory Visit: Payer: Self-pay | Admitting: Family Medicine

## 2016-04-22 ENCOUNTER — Encounter: Payer: Self-pay | Admitting: Family Medicine

## 2016-04-22 ENCOUNTER — Ambulatory Visit (INDEPENDENT_AMBULATORY_CARE_PROVIDER_SITE_OTHER): Payer: BLUE CROSS/BLUE SHIELD | Admitting: Family Medicine

## 2016-04-22 VITALS — BP 110/74 | HR 76 | Temp 98.4°F | Resp 16 | Wt 248.0 lb

## 2016-04-22 DIAGNOSIS — M10071 Idiopathic gout, right ankle and foot: Secondary | ICD-10-CM | POA: Diagnosis not present

## 2016-04-22 DIAGNOSIS — M109 Gout, unspecified: Secondary | ICD-10-CM

## 2016-04-22 NOTE — Progress Notes (Signed)
Subjective:     Patient ID: Russell Pineda, male   DOB: 08/25/1966, 50 y.o.   MRN: [id number]  HPI  Chief Complaint  Patient presents with  . Foot Pain    Patient returns back to clinic today with complaints of right foot pain. Patient states he was in office on 5/18 with complaints of heel pain and pain at achilles tendon, patient states he was instructed to get inserts placed in his shoes, he states that he also tried Etodolac but he d/c because it made him feel sick. Patient reports pain had gradually got better but now having pain on side of right foot near great toe.   Reports his right first MTP joint was inflamed and very tender for several days starting on 5/26. He took Aleve and it improved some today. Reports that he consumed a case of beer over the 3 day holiday weekend. No personal or family hx of gout   Review of Systems     Objective:   Physical Exam  Constitutional: He appears well-developed and well-nourished. No distress.  Cardiovascular:  Pulses:      Dorsalis pedis pulses are 2+ on the right side.       Posterior tibial pulses are 2+ on the right side.  Musculoskeletal: He exhibits no edema (right lower extremity).  Skin:  Right first MTP mildly swollen, tender and minimally erythematous.        Assessment:    1. 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 Discussed use of ibuprofen 800 mg. 3 x day with food and avoiding triggers like alcohol. Further f/u pending lab work.

## 2016-04-22 NOTE — Patient Instructions (Addendum)
Avoid alcohol. We will call you with the lab results. To help with a flare take ibuprofen 800 mg. 3 x day with food.

## 2016-04-23 ENCOUNTER — Telehealth: Payer: Self-pay | Admitting: Family Medicine

## 2016-04-23 ENCOUNTER — Telehealth: Payer: Self-pay

## 2016-04-23 LAB — CBC WITH DIFFERENTIAL/PLATELET
BASOS: 1 %
Basophils Absolute: 0 10*3/uL (ref 0.0–0.2)
EOS (ABSOLUTE): 0.4 10*3/uL (ref 0.0–0.4)
EOS: 4 %
HEMOGLOBIN: 16.3 g/dL (ref 12.6–17.7)
Hematocrit: 46.3 % (ref 37.5–51.0)
IMMATURE GRANS (ABS): 0 10*3/uL (ref 0.0–0.1)
IMMATURE GRANULOCYTES: 1 %
LYMPHS: 20 %
Lymphocytes Absolute: 1.8 10*3/uL (ref 0.7–3.1)
MCH: 31.5 pg (ref 26.6–33.0)
MCHC: 35.2 g/dL (ref 31.5–35.7)
MCV: 89 fL (ref 79–97)
MONOCYTES: 10 %
Monocytes Absolute: 0.8 10*3/uL (ref 0.1–0.9)
NEUTROS ABS: 5.6 10*3/uL (ref 1.4–7.0)
NEUTROS PCT: 64 %
PLATELETS: 263 10*3/uL (ref 150–379)
RBC: 5.18 x10E6/uL (ref 4.14–5.80)
RDW: 13 % (ref 12.3–15.4)
WBC: 8.7 10*3/uL (ref 3.4–10.8)

## 2016-04-23 LAB — SEDIMENTATION RATE: Sed Rate: 6 mm/hr (ref 0–15)

## 2016-04-23 LAB — RENAL FUNCTION PANEL
Albumin: 4.1 g/dL (ref 3.5–5.5)
BUN / CREAT RATIO: 20 (ref 9–20)
BUN: 17 mg/dL (ref 6–24)
CALCIUM: 9.4 mg/dL (ref 8.7–10.2)
CHLORIDE: 104 mmol/L (ref 96–106)
CO2: 21 mmol/L (ref 18–29)
CREATININE: 0.86 mg/dL (ref 0.76–1.27)
GFR calc Af Amer: 119 mL/min/{1.73_m2} (ref >59)
GFR calc non Af Amer: 103 mL/min/{1.73_m2} (ref >59)
Glucose: 123 mg/dL — ABNORMAL HIGH (ref 65–99)
PHOSPHORUS: 3.3 mg/dL (ref 2.5–4.5)
Potassium: 4.5 mmol/L (ref 3.5–5.2)
SODIUM: 142 mmol/L (ref 134–144)

## 2016-04-23 LAB — URIC ACID: Uric Acid: 9.6 mg/dL — ABNORMAL HIGH (ref 3.7–8.6)

## 2016-04-23 NOTE — Telephone Encounter (Signed)
Patients wife was advised ( she is listed in [place]).KW

## 2016-04-23 NOTE — Telephone Encounter (Signed)
-----   Message from Carmon Ginsberg, [place] sent at 04/23/2016  7:43 AM EDT ----- Your uric acid level was elevated. Let's check it again in two-four weeks after your toe is better and you have been off any triggers (especially alcohol).

## 2016-04-23 NOTE — Telephone Encounter (Signed)
Pt is requesting a call back about is labs.  CB#[phone]/MW

## 2016-04-24 NOTE — Telephone Encounter (Signed)
Advised patient of results. Patient will call in 2-4 weeks to have labs rechecked.     Notes Recorded by Carmon Ginsberg, PA on 04/23/2016 at 7:43 AM Your uric acid level was elevated. Let's check it again in two-four weeks after your toe is better and you have been off any triggers (especially alcohol).

## 2016-04-25 ENCOUNTER — Telehealth: Payer: Self-pay | Admitting: Family Medicine

## 2016-04-25 ENCOUNTER — Other Ambulatory Visit: Payer: Self-pay | Admitting: Family Medicine

## 2016-04-25 DIAGNOSIS — M79676 Pain in unspecified toe(s): Secondary | ICD-10-CM

## 2016-04-25 MED ORDER — PREDNISONE 10 MG PO TABS: ORAL_TABLET | ORAL | Status: DC

## 2016-04-25 NOTE — Telephone Encounter (Signed)
Pt states he came in Tuesday with foot/toe pain.  Pt states this is not any better.  Pt is requesting a Rx to help with the this.  [facility].  CB#[phone]/MW

## 2016-04-25 NOTE — Telephone Encounter (Signed)
States gout has flared up again despite use of high dose ibuprofen and attention to diet. Will try prednisone

## 2016-04-25 NOTE — Telephone Encounter (Signed)
Please review office note and advise. KW 

## 2016-05-01 ENCOUNTER — Ambulatory Visit (INDEPENDENT_AMBULATORY_CARE_PROVIDER_SITE_OTHER): Payer: BLUE CROSS/BLUE SHIELD | Admitting: Family Medicine

## 2016-05-01 ENCOUNTER — Encounter: Payer: Self-pay | Admitting: Family Medicine

## 2016-05-01 VITALS — BP 130/80 | HR 73 | Temp 98.1°F | Resp 16 | Wt 242.0 lb

## 2016-05-01 DIAGNOSIS — M10072 Idiopathic gout, left ankle and foot: Secondary | ICD-10-CM

## 2016-05-01 DIAGNOSIS — M109 Gout, unspecified: Secondary | ICD-10-CM

## 2016-05-01 MED ORDER — COLCHICINE 0.6 MG PO TABS: ORAL_TABLET | ORAL | Status: DC

## 2016-05-01 NOTE — Patient Instructions (Signed)

## 2016-05-01 NOTE — Progress Notes (Signed)
       Patient: Russell Pineda Male    DOB: [birth year]   50 y.o.   MRN: [id number] Visit Date: 05/01/2016  Today's Provider: Lelon Huh, MD   Chief Complaint  Patient presents with  . Foot Pain    Left and Right    Subjective:    Foot Pain This is a recurrent problem. Pertinent negatives include no abdominal pain, chest pain, chills, fever, nausea or vomiting.    Patient has had right foot pain for last 3 weeks. Yesterday left foot began to hurt and does have some swelling. . Patient seen Mariel Sleet last week and found uric acid level  was 9.6. Gout handout provided. Discussed use of ibuprofen 800 mg. 3 x day with food and avoiding triggers like alcohol. Has cut way back on red meats and started eating cherries. Since then pain in right great toe has improved, but worsened on left  Prior to onset of toe pain he was seen by Patrici Ranks for pain in back of heal. He was diagnosed with achilles tendonitis and advised to use shoe insert. He states that the heel pain has resolved.        Allergies  Allergen Reactions  . Niacin     Flushing   Current Meds  Medication Sig  . amitriptyline (ELAVIL) 25 MG tablet TAKE 1 TABLET EVERY DAY  . cetirizine (ZYRTEC) 10 MG tablet Take 10 mg by mouth daily.  . fluocinonide cream (LIDEX) [id number] % Apply 1 application topically as needed.   . fluticasone (FLONASE) 50 MCG/ACT nasal spray USE 1 SPRAY IN EACH NOSTRIL DAILY  . Omega-3 Fatty Acids (FISH OIL PO) Take by mouth daily.  . pantoprazole (PROTONIX) 40 MG tablet TAKE 1 TABLET BY MOUTH EVERY DAY    Review of Systems  Constitutional: Negative for fever, chills and appetite change.  Respiratory: Negative for chest tightness, shortness of breath and wheezing.   Cardiovascular: Negative for chest pain and palpitations.  Gastrointestinal: Negative for nausea, vomiting and abdominal pain.    Social History  Substance Use Topics  . Smoking status: Never Smoker   . Smokeless tobacco: Former Systems developer      Types: Chew    Quit date: 11/24/2004  . Alcohol Use: 8.4 oz/week    14 Standard drinks or equivalent per week   Objective:   BP 130/80 mmHg  Pulse 73  Temp(Src) 98.1 F (36.7 C) (Oral)  Resp 16  Wt 242 lb (109.77 kg)  SpO2 96%  Physical Exam  General appearance: alert, well developed, well nourished, cooperative and in no distress Head: Normocephalic, without obvious abnormality, atraumatic Respiratory: Respirations even and unlabored, normal respiratory rate Extremities: Moderately swollen and tender l MTP of left great toe. Slightly erythematous. Minimal swelling of right first MTP.      Assessment & Plan:     1. Acute gout of left foot, unspecified cause  - colchicine 0.6 MG tablet; 2 tablets today, then one tablet daily  Dispense: 30 tablet; Refill: 1  Call if not quickly improving. Continue dietary changes. Return in July to recheck uric acid and consider allopurinol.       Russell Huh, MD  [facility]

## 2016-05-07 ENCOUNTER — Telehealth: Payer: Self-pay | Admitting: Family Medicine

## 2016-05-07 DIAGNOSIS — M10071 Idiopathic gout, right ankle and foot: Secondary | ICD-10-CM

## 2016-05-07 NOTE — Telephone Encounter (Signed)
Pt called wanting to know when does he need to get his uric acid  labs done again for his gout.  He seen Dr. Caryn Section last week and seen Mikki Santee a couple weeks before that.  He is not clear as to when he needs to follow up. He last a follow up in July with Dr. Caryn Section but he thinks he needs to have labs before that.   His call back is [phone].  Thanks, Russell Pineda

## 2016-05-08 NOTE — Telephone Encounter (Signed)
Patient was notified. Labs ordered and waiting at front desk for pick-up.

## 2016-05-08 NOTE — Telephone Encounter (Signed)
It takes several weeks for uric acid to come down. We should wait until July to check it. We can check when he comes in for o.v., or he can go to lab a few days before o.v. So we have results when he comes in.

## 2016-05-08 NOTE — Telephone Encounter (Signed)
Please review-aa 

## 2016-05-21 ENCOUNTER — Telehealth: Payer: Self-pay | Admitting: Family Medicine

## 2016-05-21 NOTE — Telephone Encounter (Signed)
Pt states Dr Caryn Section advised he will need to have his uric acid rechecked this week.  Pt is requesting a lab slip.  CB#[phone]/MW

## 2016-05-22 NOTE — Telephone Encounter (Signed)
Patient was notified that lab slip is ready to pick-up.

## 2016-05-23 LAB — URIC ACID: Uric Acid: 9.2 mg/dL — ABNORMAL HIGH (ref 3.7–8.6)

## 2016-05-26 ENCOUNTER — Telehealth: Payer: Self-pay

## 2016-05-26 MED ORDER — ALLOPURINOL 100 MG PO TABS: 100.0000 mg | ORAL_TABLET | Freq: Every day | ORAL | Status: DC

## 2016-05-26 NOTE — Telephone Encounter (Signed)
Pt advised; he is going to call back with the pharmacy.  Thanks,   -Mickel Baas

## 2016-05-26 NOTE — Telephone Encounter (Signed)
-----   Message from Birdie Sons, MD sent at 05/24/2016  8:45 PM EDT ----- Uric acid level is still high. Need to start allopurinol 100mg  daily. #30, rf x 1. Follow up in 3 weeks as scheduled.

## 2016-05-28 ENCOUNTER — Other Ambulatory Visit: Payer: Self-pay | Admitting: Family Medicine

## 2016-05-28 MED ORDER — FLUOCINONIDE 0.05 % EX CREA: 1.0000 "application " | TOPICAL_CREAM | CUTANEOUS | Status: DC | PRN

## 2016-05-28 NOTE — Telephone Encounter (Signed)
Pt requesting fluocinonide cream (LIDEX) 0.05 % called into [facility], [place], [place].  He is out of town and left cream at home.

## 2016-06-12 ENCOUNTER — Encounter: Payer: Self-pay | Admitting: Family Medicine

## 2016-06-12 ENCOUNTER — Ambulatory Visit (INDEPENDENT_AMBULATORY_CARE_PROVIDER_SITE_OTHER): Payer: BLUE CROSS/BLUE SHIELD | Admitting: Family Medicine

## 2016-06-12 ENCOUNTER — Telehealth: Payer: Self-pay | Admitting: Family Medicine

## 2016-06-12 VITALS — BP 110/78 | HR 63 | Temp 98.1°F | Resp 16 | Wt 240.0 lb

## 2016-06-12 DIAGNOSIS — E790 Hyperuricemia without signs of inflammatory arthritis and tophaceous disease: Secondary | ICD-10-CM | POA: Diagnosis not present

## 2016-06-12 DIAGNOSIS — M10072 Idiopathic gout, left ankle and foot: Secondary | ICD-10-CM

## 2016-06-12 DIAGNOSIS — M109 Gout, unspecified: Secondary | ICD-10-CM

## 2016-06-12 MED ORDER — INDOMETHACIN 50 MG PO CAPS: 50.0000 mg | ORAL_CAPSULE | Freq: Two times a day (BID) | ORAL | Status: DC

## 2016-06-12 NOTE — Progress Notes (Signed)
       Patient: Russell Pineda Male    DOB: 10/21/1966   50 y.o.   MRN: [id number] Visit Date: 06/12/2016  Today's Provider: Lelon Huh, MD   Chief Complaint  Patient presents with  . Follow-up  . Gout   Subjective:    HPI  Follow upgout of left foot, unspecified cause: From 05/01/2016-started colchicine 0.6 MG tablet x1 qd, but states Colchicine didn't help at all, but pain eventually resolved.  Labs 05/22/2016; showed Uric acid level elevate at 9.2 and started pt on allopurinol 100 mg qd.  However he states started hurting and swelling around big toe yesterday.    Allergies  Allergen Reactions  . Niacin     Flushing   Current Meds  Medication Sig  . allopurinol (ZYLOPRIM) 100 MG tablet Take 1 tablet (100 mg total) by mouth daily.  Marland Kitchen amitriptyline (ELAVIL) 25 MG tablet TAKE 1 TABLET EVERY DAY  . cetirizine (ZYRTEC) 10 MG tablet Take 10 mg by mouth daily.  . fluocinonide cream (LIDEX) [id number] % Apply 1 application topically as needed.  . fluticasone (FLONASE) 50 MCG/ACT nasal spray USE 1 SPRAY IN EACH NOSTRIL DAILY  . Omega-3 Fatty Acids (FISH OIL PO) Take by mouth daily.  . pantoprazole (PROTONIX) 40 MG tablet TAKE 1 TABLET BY MOUTH EVERY DAY  . [DISCONTINUED] etodolac (LODINE) 400 MG tablet Take 1 tablet (400 mg total) by mouth 2 (two) times daily.    Review of Systems  Constitutional: Negative for fever, chills and appetite change.  Respiratory: Negative for chest tightness, shortness of breath and wheezing.   Cardiovascular: Negative for chest pain and palpitations.  Gastrointestinal: Negative for nausea, vomiting and abdominal pain.     Social History  Substance Use Topics  . Smoking status: Never Smoker   . Smokeless tobacco: Former Systems developer    Types: Chew    Quit date: 11/24/2004  . Alcohol Use: 8.4 oz/week    14 Standard drinks or equivalent per week   Objective:   BP 110/78 mmHg  Pulse 63  Temp(Src) 98.1 F (36.7 C) (Oral)  Resp 16  Wt 240 lb (108.863 kg)   SpO2 96%  Physical Exam Mild swelling, redness, and swelling around left first MTP c/w gout.      Assessment & Plan:     1. Acute gout of left foot, unspecified cause He doesn't want to take indomethacin because he doesn't think it helped. He wants to try indomethacin, but states that etodolac upset his stomach. He is taking OTC esomeprazole daily and advised to try taking Pepcid AC before each dose of indomethacin.  - indomethacin (INDOCIN) 50 MG capsule; Take 1 capsule (50 mg total) by mouth 2 (two) times daily with a meal. As needed for gout  Dispense: 30 capsule; Refill: 0   2. 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  - Uric acid      Lelon Huh, MD  [facility]

## 2016-06-12 NOTE — Patient Instructions (Addendum)
Take a dose of Pepcid AC before each dose of indomethacin  Can try Black Cherry Juice extract to reduce gout flares.

## 2016-06-12 NOTE — Telephone Encounter (Signed)
Already been done.

## 2016-06-12 NOTE — Telephone Encounter (Signed)
Per pt wife the Rx for indomethacin (INDOCIN) 50 MG capsule was sent to the wrong pharmacy.  Pt is requesting this resent to [facility].  [id number]

## 2016-06-13 ENCOUNTER — Telehealth: Payer: Self-pay | Admitting: *Deleted

## 2016-06-13 ENCOUNTER — Ambulatory Visit: Payer: BLUE CROSS/BLUE SHIELD | Admitting: Family Medicine

## 2016-06-13 LAB — URIC ACID: Uric Acid: 7.2 mg/dL (ref 3.7–8.6)

## 2016-06-13 MED ORDER — ALLOPURINOL 100 MG PO TABS: 100.0000 mg | ORAL_TABLET | Freq: Two times a day (BID) | ORAL | Status: DC

## 2016-06-13 NOTE — Telephone Encounter (Signed)
Patient notified of results. Patient expressed understanding. Rx sent to pharmacy.  

## 2016-06-13 NOTE — Telephone Encounter (Signed)
-----   Message from Birdie Sons, MD sent at 06/13/2016  8:24 AM EDT ----- Uric acid levels are down from 9.2 to 7.2, recommendations are to get it below 6.5 to best prevent gout flares. Increase allopurinol to 2 x 100mg  daily, #60, rf x 5.

## 2016-07-02 ENCOUNTER — Other Ambulatory Visit: Payer: Self-pay | Admitting: Family Medicine

## 2016-07-02 DIAGNOSIS — M109 Gout, unspecified: Secondary | ICD-10-CM

## 2016-08-07 DIAGNOSIS — J309 Allergic rhinitis, unspecified: Secondary | ICD-10-CM | POA: Diagnosis not present

## 2016-08-07 DIAGNOSIS — K219 Gastro-esophageal reflux disease without esophagitis: Secondary | ICD-10-CM | POA: Diagnosis not present

## 2016-08-07 DIAGNOSIS — J029 Acute pharyngitis, unspecified: Secondary | ICD-10-CM | POA: Diagnosis not present

## 2016-08-08 ENCOUNTER — Ambulatory Visit: Payer: BLUE CROSS/BLUE SHIELD | Admitting: Family Medicine

## 2016-09-17 ENCOUNTER — Other Ambulatory Visit: Payer: Self-pay | Admitting: Family Medicine

## 2016-09-24 ENCOUNTER — Other Ambulatory Visit: Payer: Self-pay | Admitting: *Deleted

## 2016-09-24 MED ORDER — PANTOPRAZOLE SODIUM 40 MG PO TBEC: 40.0000 mg | DELAYED_RELEASE_TABLET | Freq: Every day | ORAL | 4 refills | Status: DC

## 2016-09-24 NOTE — Telephone Encounter (Signed)
Requesting 90 day supply.

## 2016-09-26 ENCOUNTER — Ambulatory Visit (INDEPENDENT_AMBULATORY_CARE_PROVIDER_SITE_OTHER): Payer: BLUE CROSS/BLUE SHIELD | Admitting: Family Medicine

## 2016-09-26 ENCOUNTER — Encounter: Payer: Self-pay | Admitting: Family Medicine

## 2016-09-26 VITALS — BP 102/70 | HR 84 | Temp 98.3°F | Resp 16 | Ht 71.0 in | Wt 238.0 lb

## 2016-09-26 DIAGNOSIS — E790 Hyperuricemia without signs of inflammatory arthritis and tophaceous disease: Secondary | ICD-10-CM

## 2016-09-26 DIAGNOSIS — E782 Mixed hyperlipidemia: Secondary | ICD-10-CM | POA: Diagnosis not present

## 2016-09-26 DIAGNOSIS — G4733 Obstructive sleep apnea (adult) (pediatric): Secondary | ICD-10-CM | POA: Diagnosis not present

## 2016-09-26 DIAGNOSIS — Z0000 Encounter for general adult medical examination without abnormal findings: Secondary | ICD-10-CM

## 2016-09-26 NOTE — Patient Instructions (Addendum)
We will call you with the lab results and when the form is completed. Consider seeing an eye doctor about your eye sensitivity.Consider trying Zantac otc 75 mg.two pills daily instead of protonix.

## 2016-09-26 NOTE — Progress Notes (Signed)
Subjective:     Patient ID: Russell Pineda, male   DOB: 02-09-1966, 50 y.o.   MRN: [id number]  HPI  Chief Complaint  Patient presents with  . Annual Exam    Last Cpe- 10/29/2012. Refuses flu vaccine.  States he is feeling well. Has biometric form to complete.   Review of Systems General: Feeling well HEENT: regular dental visits and recent eye exam. Reports photosensitivity which he attributes to one of his medications. Asked him to discuss this further with his eye doctor. Chronic tinnitus reported. Cardiovascular: no chest pain, shortness of breath, or palpitations Respiratory: continues with CPAP @11  cm/H2O. States he is not longer sleepy during the day. GI: no heartburn-controlled with medication. No change in bowel habits or blood in the stool GU: nocturia x 0, no change in bladder habits  Psychiatric: not depressed Musculoskeletal: no joint pain. Allopurinol and reduction in consumption of alcohol controlling gout.    Objective:   Physical Exam  Constitutional: He appears well-developed and well-nourished. No distress.  Eyes: PERRLA Neck: no thyromegaly, tenderness or nodules, no cervical adenopathy ENT: TM's intact without inflammation; Tonsils absent Lungs: Clear Heart : RRR without murmur or gallop Abd: bowel sounds present, soft, non-tender, no organomegaly Extremities: no edema      Assessment:    1. OSA (obstructive sleep apnea): continue CPAP  2. Hyperlipemia, mixed - Lipid panel  3. Hyperuricemia - Uric acid  4. Annual physical exam - Comprehensive metabolic panel - HgB [id number]    Plan:    Will complete form when labs available. Discussed trying Zantac for long term use. Discuss photosensitivity with your eye doctor.

## 2016-09-27 LAB — HEMOGLOBIN A1C
ESTIMATED AVERAGE GLUCOSE: 108 mg/dL
Hgb A1c MFr Bld: 5.4 % (ref 4.8–5.6)

## 2016-09-27 LAB — COMPREHENSIVE METABOLIC PANEL
A/G RATIO: 2.1 (ref 1.2–2.2)
ALK PHOS: 38 IU/L — AB (ref 39–117)
ALT: 32 IU/L (ref 0–44)
AST: 27 IU/L (ref 0–40)
Albumin: 4.6 g/dL (ref 3.5–5.5)
BUN/Creatinine Ratio: 16 (ref 9–20)
BUN: 14 mg/dL (ref 6–24)
Bilirubin Total: 0.6 mg/dL (ref 0.0–1.2)
CHLORIDE: 102 mmol/L (ref 96–106)
CO2: 20 mmol/L (ref 18–29)
CREATININE: 0.87 mg/dL (ref 0.76–1.27)
Calcium: 9.5 mg/dL (ref 8.7–10.2)
GFR calc Af Amer: 118 mL/min/{1.73_m2} (ref >59)
GFR calc non Af Amer: 102 mL/min/{1.73_m2} (ref >59)
GLOBULIN, TOTAL: 2.2 g/dL (ref 1.5–4.5)
Glucose: 102 mg/dL — ABNORMAL HIGH (ref 65–99)
POTASSIUM: 4.4 mmol/L (ref 3.5–5.2)
SODIUM: 140 mmol/L (ref 134–144)
Total Protein: 6.8 g/dL (ref 6.0–8.5)

## 2016-09-27 LAB — LIPID PANEL
CHOLESTEROL TOTAL: 221 mg/dL — AB (ref 100–199)
Chol/HDL Ratio: 4.7 ratio units (ref 0.0–5.0)
HDL: 47 mg/dL (ref >39)
LDL Calculated: 127 mg/dL — ABNORMAL HIGH (ref 0–99)
TRIGLYCERIDES: 235 mg/dL — AB (ref 0–149)
VLDL Cholesterol Cal: 47 mg/dL — ABNORMAL HIGH (ref 5–40)

## 2016-09-27 LAB — URIC ACID: Uric Acid: 5.8 mg/dL (ref 3.7–8.6)

## 2016-09-29 ENCOUNTER — Telehealth: Payer: Self-pay

## 2016-09-29 NOTE — Telephone Encounter (Signed)
-----   Message from Carmon Ginsberg, [place] sent at 09/29/2016  7:45 AM EST ----- Labs look ok. Mildly elevated cholesterol but your calculated 10 year risk for developing cardiovascular disease is low at 2.4%. Your uric acid levels are well controlled with medication. Your biometric form will be up front for pickup.

## 2016-09-29 NOTE — Telephone Encounter (Signed)
Advised pt of lab results. Pt verbally acknowledges understanding. Emily Drozdowski, CMA   

## 2016-10-28 ENCOUNTER — Encounter: Payer: Self-pay | Admitting: Family Medicine

## 2016-10-28 ENCOUNTER — Ambulatory Visit (INDEPENDENT_AMBULATORY_CARE_PROVIDER_SITE_OTHER): Payer: BLUE CROSS/BLUE SHIELD | Admitting: Family Medicine

## 2016-10-28 ENCOUNTER — Ambulatory Visit
Admission: RE | Admit: 2016-10-28 | Discharge: 2016-10-28 | Disposition: A | Discharge status: Home / Self Care | Payer: BLUE CROSS/BLUE SHIELD | Source: Ambulatory Visit | Attending: Family Medicine | Admitting: Family Medicine

## 2016-10-28 ENCOUNTER — Telehealth: Payer: Self-pay

## 2016-10-28 VITALS — BP 100/62 | HR 66 | Temp 98.0°F | Resp 16 | Wt 240.2 lb

## 2016-10-28 DIAGNOSIS — R0789 Other chest pain: Secondary | ICD-10-CM | POA: Insufficient documentation

## 2016-10-28 MED ORDER — MELOXICAM 15 MG PO TABS: 15.0000 mg | ORAL_TABLET | Freq: Every day | ORAL | 0 refills | Status: DC

## 2016-10-28 NOTE — Telephone Encounter (Signed)
-----   Message from Carmon Ginsberg, [place] sent at 10/28/2016  1:43 PM EST ----- X-ray ok

## 2016-10-28 NOTE — Patient Instructions (Signed)
Discussed use of heating pad for 20 minutes a 2-3 x day. We will call you with the x-ray report.

## 2016-10-28 NOTE — Telephone Encounter (Signed)
Patient was advised. KW 

## 2016-10-28 NOTE — Progress Notes (Signed)
Subjective:     Patient ID: Russell Pineda, male   DOB: 12-Mar-1966, 50 y.o.   MRN: [id number]  HPI  Chief Complaint  Patient presents with  . Back Pain    Patient comes in office today with concerns of back pain on the upper right side of his back for more than two weeks. Patient states that pain is described as dull ache and when moving is described as a sharp pain that takes his breath away. Patient denies any heavy lifting or injury that could have triggered pain, patient has been taking otc Ibuprofen for relief.   Further discussion with his wife reveals a lot of lifting at home which may have initially triggered his pain. Exacerbated by movement,cough, or sneezing. Ibuprofen 800 mg.at bedtime and hot showers seem to help.   Review of Systems  Respiratory: Negative for shortness of breath.        Objective:   Physical Exam  Constitutional: He appears well-developed and well-nourished. No distress.  Pulmonary/Chest: Breath sounds normal. He exhibits tenderness (right posterior lateral chest wall).       Assessment:    1. Chest wall pain - DG Chest 2 View; Future - meloxicam (MOBIC) 15 MG tablet; Take 1 tablet (15 mg total) by mouth daily.  Dispense: 30 tablet; Refill: 0    Plan:   Discussed use of heat for 20 minutes. Further f/u pending x-ray report.

## 2016-11-15 ENCOUNTER — Other Ambulatory Visit: Payer: Self-pay | Admitting: Family Medicine

## 2016-11-24 ENCOUNTER — Other Ambulatory Visit: Payer: Self-pay | Admitting: Family Medicine

## 2016-11-24 DIAGNOSIS — R0789 Other chest pain: Secondary | ICD-10-CM

## 2016-12-01 ENCOUNTER — Other Ambulatory Visit: Payer: Self-pay

## 2016-12-01 MED ORDER — FLUTICASONE PROPIONATE 50 MCG/ACT NA SUSP: 1.0000 | Freq: Every day | NASAL | 3 refills | Status: DC

## 2016-12-01 NOTE — Telephone Encounter (Signed)
Pt called to see if fluticasone (FLONASE) 50 MCG/ACT nasal spray had been sent to pharmacy. I advised refill request can take 24 to 48 hours. Please advise. Thanks TNP

## 2016-12-01 NOTE — Telephone Encounter (Signed)
Pharmacy requesting refills. Pharmacy listed is correct. Thanks!

## 2016-12-09 ENCOUNTER — Ambulatory Visit (INDEPENDENT_AMBULATORY_CARE_PROVIDER_SITE_OTHER): Payer: BLUE CROSS/BLUE SHIELD | Admitting: Family Medicine

## 2016-12-09 ENCOUNTER — Encounter: Payer: Self-pay | Admitting: Family Medicine

## 2016-12-09 VITALS — BP 114/70 | HR 87 | Temp 98.7°F | Resp 16 | Wt 245.6 lb

## 2016-12-09 DIAGNOSIS — J0100 Acute maxillary sinusitis, unspecified: Secondary | ICD-10-CM | POA: Diagnosis not present

## 2016-12-09 MED ORDER — AMOXICILLIN-POT CLAVULANATE 875-125 MG PO TABS: 1.0000 | ORAL_TABLET | Freq: Two times a day (BID) | ORAL | 0 refills | Status: DC

## 2016-12-09 NOTE — Patient Instructions (Signed)
Resume Mucinex D for congestion and Delsym for cough. Try saline nasal spray to humidify and ease irritation.

## 2016-12-09 NOTE — Progress Notes (Signed)
Subjective:     Patient ID: Russell Pineda, male   DOB: 1966-07-18, 51 y.o.   MRN: [id number]  HPI  Chief Complaint  Patient presents with  . Cough    Patient comes in office today with concerns of cough and congestion for the past 3 weeks, patient reports produtive cough began 2 weeks ago. Associates ymptoms include: runny nose, nasal cavity burning and post nasal drainage. Patient has taken otc Mucinex D and Flonase  Patient reports increased sinus pressure, purulent sinus drainage, post nasal drainage and accompanying cough   Review of Systems     Objective:   Physical Exam  Constitutional: He appears well-developed and well-nourished. No distress.  Ears: T.M's intact without inflammation Sinuses: mild maxillary sinus tenderness Throat: tonsils absent Neck: no cervical adenopathy Lungs: clear     Assessment:    1. Acute non-recurrent maxillary sinusitis - amoxicillin-clavulanate (AUGMENTIN) 875-125 MG tablet; Take 1 tablet by mouth 2 (two) times daily.  Dispense: 20 tablet; Refill: 0    Plan:   Resume Mucinex D and try saline nasal spray for your sinuses.

## 2016-12-12 ENCOUNTER — Telehealth: Payer: Self-pay | Admitting: Family Medicine

## 2016-12-12 NOTE — Telephone Encounter (Signed)
Please review-aa 

## 2016-12-12 NOTE — Telephone Encounter (Signed)
States sinus drainage is clearing in color and PND has improved. However has continued frontal sinus pressure which triggered a headache last night. Discussed adding Flonase spray. If not improving add a topical nasal decongestant spray for 2-3 days.

## 2016-12-12 NOTE — Telephone Encounter (Signed)
Pt wife called states pt seen Mikki Santee on Tuesday for sinus congestion and cough.  Pt has been taking the Rx that was given, Delsym cough medication and mucinex D but still having sinus congestion and cough.  Pt also has a really bad headache.  Pt wife is asking if there is something else he can take to help with this.  [facility].  [phone]

## 2017-03-06 DIAGNOSIS — H40003 Preglaucoma, unspecified, bilateral: Secondary | ICD-10-CM | POA: Diagnosis not present

## 2017-05-17 ENCOUNTER — Other Ambulatory Visit: Payer: Self-pay | Admitting: Family Medicine

## 2017-07-14 ENCOUNTER — Other Ambulatory Visit: Payer: Self-pay | Admitting: Family Medicine

## 2017-07-14 ENCOUNTER — Ambulatory Visit (INDEPENDENT_AMBULATORY_CARE_PROVIDER_SITE_OTHER): Payer: BLUE CROSS/BLUE SHIELD | Admitting: Family Medicine

## 2017-07-14 ENCOUNTER — Encounter: Payer: Self-pay | Admitting: Family Medicine

## 2017-07-14 ENCOUNTER — Ambulatory Visit: Payer: BLUE CROSS/BLUE SHIELD | Admitting: Family Medicine

## 2017-07-14 VITALS — BP 122/86 | HR 70 | Temp 98.4°F | Resp 16 | Ht 71.5 in | Wt 249.0 lb

## 2017-07-14 DIAGNOSIS — M5126 Other intervertebral disc displacement, lumbar region: Secondary | ICD-10-CM | POA: Insufficient documentation

## 2017-07-14 DIAGNOSIS — G4733 Obstructive sleep apnea (adult) (pediatric): Secondary | ICD-10-CM | POA: Diagnosis not present

## 2017-07-14 DIAGNOSIS — F419 Anxiety disorder, unspecified: Secondary | ICD-10-CM | POA: Diagnosis not present

## 2017-07-14 MED ORDER — VENLAFAXINE HCL ER 75 MG PO CP24: 75.0000 mg | ORAL_CAPSULE | Freq: Every day | ORAL | 0 refills | Status: DC

## 2017-07-14 NOTE — Patient Instructions (Signed)
Let me know if you can't tolerate the medication before your appointment.

## 2017-07-14 NOTE — Progress Notes (Signed)
Subjective:     Patient ID: Russell Pineda, male   DOB: 08-31-1966, 51 y.o.   MRN: [id number]  HPI  Chief Complaint  Patient presents with  . Annual Exam    Patient comes in office today for his annual physical he states that he feels well today but would like to address issues that he has been having over the past 6 months. Patient states that he has been easily distracted and having difficulty concentrating on tasks,patient states that it feels like his " anxiety is kicking in." Patient reports that he follows a well balanced diet even though he is not actively exercising, he sleeps on average 8hrs but reports he still is fatigued.   Discussed that since his physical would not be due until November have elected to focus on his acute issues today. States that he is concerned that he occasionally will forget words particularly when he is in a socially stressful situation talking to a customer or a boss. Reports he has always had trouble with his mind wandering when reading since he was a child. No ADD hx reported. He has been on sertraline in the past but developed dizziness and tinnitus. Also has tried buspirone. Also wishes rx for C-pap filters.   Review of Systems     Objective:   Physical Exam  Constitutional: He appears well-developed and well-nourished. No distress.  Psychiatric: He has a normal mood and affect. His behavior is normal.       Assessment:    1. OSA (obstructive sleep apnea): RX for C-Pap equipment provided  2. Anxiety - venlafaxine XR (EFFEXOR XR) 75 MG 24 hr capsule; Take 1 capsule (75 mg total) by mouth daily with breakfast.  Dispense: 30 capsule; Refill: 0    Plan:    Will f/u in two weeks or will cal sooner if not tolerating medication.

## 2017-07-29 ENCOUNTER — Ambulatory Visit (INDEPENDENT_AMBULATORY_CARE_PROVIDER_SITE_OTHER): Payer: BLUE CROSS/BLUE SHIELD | Admitting: Family Medicine

## 2017-07-29 ENCOUNTER — Encounter: Payer: Self-pay | Admitting: Family Medicine

## 2017-07-29 VITALS — BP 138/84 | HR 72 | Temp 98.6°F | Resp 16 | Wt 247.0 lb

## 2017-07-29 DIAGNOSIS — F419 Anxiety disorder, unspecified: Secondary | ICD-10-CM | POA: Diagnosis not present

## 2017-07-29 MED ORDER — VENLAFAXINE HCL ER 150 MG PO CP24
150.0000 mg | ORAL_CAPSULE | Freq: Every day | ORAL | 0 refills | Status: DC
Start: 2017-07-29 — End: 2017-08-10

## 2017-07-29 NOTE — Progress Notes (Signed)
Subjective:     Patient ID: Russell Pineda, male   DOB: 21-Dec-1965, 51 y.o.   MRN: [id number]  HPI  Chief Complaint  Patient presents with  . Anxiety  States he doesn't see much difference in his concentration or anxiety at current dose. Reports tolerance of the medication but may be making him drowsier than usual.   Review of Systems     Objective:   Physical Exam  Constitutional: He appears well-developed and well-nourished. No distress.  Psychiatric: He has a normal mood and affect. His behavior is normal.       Assessment:    1. Anxiety: will increase dose - venlafaxine XR (EFFEXOR-XR) 150 MG 24 hr capsule; Take 1 capsule (150 mg total) by mouth daily with breakfast.  Dispense: 30 capsule; Refill: 0    Plan:    return in 3 weeks or phone f/u if doing well.

## 2017-07-29 NOTE — Patient Instructions (Signed)
If you are doing well on increased dose of medication we can do a phone follow up. If not doing well or no change come in for the office visit.

## 2017-08-10 ENCOUNTER — Other Ambulatory Visit: Payer: Self-pay | Admitting: Family Medicine

## 2017-08-10 DIAGNOSIS — F419 Anxiety disorder, unspecified: Secondary | ICD-10-CM

## 2017-08-10 NOTE — Telephone Encounter (Signed)
[facility] supply for the following medication. Thanks CC.  venlafaxine XR (EFFEXOR-XR) 150 MG 24 hr capsule  >Take 1 Capsule ( 75 MG total ) by mouth daily with  Breakfast.

## 2017-08-10 NOTE — Telephone Encounter (Signed)
Please review. Thanks!  

## 2017-08-11 MED ORDER — VENLAFAXINE HCL ER 150 MG PO CP24: 150.0000 mg | ORAL_CAPSULE | Freq: Every day | ORAL | 0 refills | Status: DC

## 2017-08-18 ENCOUNTER — Ambulatory Visit (INDEPENDENT_AMBULATORY_CARE_PROVIDER_SITE_OTHER): Payer: BLUE CROSS/BLUE SHIELD | Admitting: Family Medicine

## 2017-08-18 ENCOUNTER — Telehealth: Payer: Self-pay | Admitting: Family Medicine

## 2017-08-18 ENCOUNTER — Encounter: Payer: Self-pay | Admitting: Family Medicine

## 2017-08-18 VITALS — BP 130/86 | HR 85 | Temp 98.7°F | Resp 16 | Wt 274.6 lb

## 2017-08-18 DIAGNOSIS — F419 Anxiety disorder, unspecified: Secondary | ICD-10-CM

## 2017-08-18 MED ORDER — HYDROXYZINE HCL 25 MG PO TABS: 25.0000 mg | ORAL_TABLET | Freq: Three times a day (TID) | ORAL | 0 refills | Status: DC | PRN

## 2017-08-18 NOTE — Telephone Encounter (Signed)
Error/MW °

## 2017-08-18 NOTE — Patient Instructions (Signed)
We will cal you about the referral .Continue current dose of Venlafaxine. Add hydroxyzine for break through symptoms.

## 2017-08-18 NOTE — Progress Notes (Signed)
Subjective:     Patient ID: Russell Pineda, male   DOB: 1966-05-13, 51 y.o.   MRN: [id number]  HPI  Chief Complaint  Patient presents with  . Anxiety    Patient comes in office today to follow up for anxiety, last office visit was 07/29/17 and Effexor-XR was increaed to 150mg  qd. Patient reports symptoms of fatigue but states good compliance and tolerance on medication, patient states that he still get anxious every now and again.   Suggested he may have more benefit over the next two weeks. States his wife does not like him to be on medication. He is agreeable to referral to psychiatry for another opinion about diagnosis and treatment.   Review of Systems     Objective:   Physical Exam  Constitutional: He appears well-developed and well-nourished. No distress.  Psychiatric: He has a normal mood and affect. His behavior is normal.       Assessment:    1. Anxiety: continue venlafaxine - Ambulatory referral to Psychiatry - hydrOXYzine (ATARAX/VISTARIL) 25 MG tablet; Take 1 tablet (25 mg total) by mouth 3 (three) times daily as needed for anxiety.  Dispense: 15 tablet; Refill: 0    Plan:    Further f/u pending psychiatry referral.

## 2017-08-20 ENCOUNTER — Other Ambulatory Visit: Payer: Self-pay | Admitting: Family Medicine

## 2017-08-20 DIAGNOSIS — F419 Anxiety disorder, unspecified: Secondary | ICD-10-CM

## 2017-08-20 MED ORDER — VENLAFAXINE HCL ER 150 MG PO CP24: 150.0000 mg | ORAL_CAPSULE | Freq: Every day | ORAL | 12 refills | Status: DC

## 2017-08-20 NOTE — Telephone Encounter (Signed)
[facility]day supply for the following medication. Thanks CC  venlafaxine XR (EFFEXOR-XR) 150 MG 24 hr capsule  >Take 1 Capsule ( 150 MG total ) by mouth daily with Breakfast.

## 2017-08-26 ENCOUNTER — Other Ambulatory Visit: Payer: Self-pay | Admitting: Family Medicine

## 2017-08-26 DIAGNOSIS — F419 Anxiety disorder, unspecified: Secondary | ICD-10-CM

## 2017-09-01 ENCOUNTER — Other Ambulatory Visit: Payer: Self-pay | Admitting: Family Medicine

## 2017-09-01 DIAGNOSIS — F419 Anxiety disorder, unspecified: Secondary | ICD-10-CM

## 2017-09-01 MED ORDER — VENLAFAXINE HCL ER 150 MG PO CP24: 150.0000 mg | ORAL_CAPSULE | Freq: Every day | ORAL | 3 refills | Status: DC

## 2017-09-01 NOTE — Telephone Encounter (Signed)
[facility] a 90-days supply for the following medication. Thanks CC  venlafaxine XR (EFFEXOR-XR) 150 MG 24 hr capsule  >Take 1 capsule (150 MG Total ) by mouth daily with breakfast.

## 2017-09-08 ENCOUNTER — Ambulatory Visit (INDEPENDENT_AMBULATORY_CARE_PROVIDER_SITE_OTHER): Payer: BLUE CROSS/BLUE SHIELD | Admitting: Psychiatry

## 2017-09-08 ENCOUNTER — Encounter: Payer: Self-pay | Admitting: Psychiatry

## 2017-09-08 VITALS — BP 130/85 | HR 82 | Temp 98.5°F | Wt 246.6 lb

## 2017-09-08 DIAGNOSIS — Z9989 Dependence on other enabling machines and devices: Secondary | ICD-10-CM

## 2017-09-08 DIAGNOSIS — G4733 Obstructive sleep apnea (adult) (pediatric): Secondary | ICD-10-CM | POA: Diagnosis not present

## 2017-09-08 DIAGNOSIS — F411 Generalized anxiety disorder: Secondary | ICD-10-CM | POA: Diagnosis not present

## 2017-09-08 DIAGNOSIS — F410 Panic disorder [episodic paroxysmal anxiety] without agoraphobia: Secondary | ICD-10-CM

## 2017-09-08 MED ORDER — AMITRIPTYLINE HCL 150 MG PO TABS: 150.0000 mg | ORAL_TABLET | Freq: Every day | ORAL | 1 refills | Status: DC

## 2017-09-08 MED ORDER — VENLAFAXINE HCL ER 37.5 MG PO CP24: 37.5000 mg | ORAL_CAPSULE | Freq: Every day | ORAL | 1 refills | Status: DC

## 2017-09-08 MED ORDER — HYDROXYZINE HCL 25 MG PO TABS: 25.0000 mg | ORAL_TABLET | Freq: Three times a day (TID) | ORAL | 1 refills | Status: DC | PRN

## 2017-09-08 NOTE — Progress Notes (Signed)
Psychiatric Initial Adult Assessment   Patient Identification: Russell Pineda MRN:  [id number] Date of Evaluation:  09/08/2017 Referral Source: Carmon Ginsberg PA  Chief Complaint:  " I am anxious ."  Chief Complaint    Establish Care; Anxiety     Visit Diagnosis:    ICD-10-CM   1. Generalized anxiety disorder F41.1 amitriptyline (ELAVIL) 150 MG tablet  2. Panic attacks F41.0 venlafaxine XR (EFFEXOR-XR) 37.5 MG 24 hr capsule    hydrOXYzine (ATARAX/VISTARIL) 25 MG tablet  3. OSA on CPAP G47.33    Z99.89     History of Present Illness:  Russell Pineda is a 54 y old AAM who is employed , lives in [place] , has a hx of anxiety , OSA , HTN, impaired hearing , presented to [facility] today to establish care.  Russell Pineda today reports that he has been having some worsening sx of anxiety, nervousness, difficulty with tasks , concentration and so on since the past 1 yr or so. He feels his anxiety sx are getting worse . He is currently on Effexor xr , started by his outpatient family physician , started a month ago. He does notice some improvement , but not a lot .  He reports hx of panic attacks - happened a few times over the past several years. He did not have any recently. He feels dizzy , chest pain and so on when he has these attacks .  He reports some anxiety issues when he has to speak in public settings , or has to catch a flight and so on.  He reports some stressors -his job changed , he got promoted 6-8 months ago. His responsibilities are more now . He reports he had worsening anxiety sx even before his job changed , so he does not really know if that is why his sx have worsened .  He does report some sleep issues. He takes Elavil 100 mg for sleep and it helps . He however does have time when he has a lot of racing thoughts at night or sleep too much or wake up groggy. He does not follow a set bedtime usually and on weekends its never regular.He does have OSA , on CPAP, states he uses  it regularly.  He reports he was diagnosed with some panic sx in the past - in 2001 or so when his father passed away from a cardiac problem. He at that time had panic attacks and was started on Zoloft and Buspar by his PMD . He however developed some tinnitus and hearing issues and hence it was not continued . He continues to have some hearing problems in [place] ears.  He also has medical issues - hx of painful gouty arthritis - which is currently managed by his PMD.  He denies any SI/HI/perceptual disturbances , manic sx or hx of trauma.   Associated Signs/Symptoms: Depression Symptoms:  fatigue, difficulty concentrating, anxiety, panic attacks, (Hypo) Manic Symptoms:  denies Anxiety Symptoms:  Excessive Worry, Panic Symptoms, Psychotic Symptoms:  denies PTSD Symptoms: Negative  Past Psychiatric History: Hx of anxiety and panic sx - has been treated with antidepressants in the past , as far as 2001 . Denies past hx of suicide attempts. Denies IP admissions .  Previous Psychotropic Medications: Yes zoloft( tinnitus) , buspar , effexor , elavil  Substance Abuse History in the last 12 months:  No.uses alcohol, 12 oz - 2-3 beers per day .  Consequences of Substance Abuse: Negative  Past Medical History:  Past Medical History:  Diagnosis Date  . Acute stress disorder   . Allergic rhinitis   . Esophageal reflux   . Gout 2016  . Hearing loss 2001  . Hypercholesterolemia   . Hyperlipidemia   . Lipoma of abdominal wall   . Lumbago   . Multiple benign nevi   . OSA (obstructive sleep apnea)     Past Surgical History:  Procedure Laterality Date  . HERNIA REPAIR    . orchidectomy     left  . TONSILLECTOMY      Family Psychiatric History: Paternal uncle - alcoholism, maternal grand father - alcoholism  Family History:  Family History  Problem Relation Age of Onset  . Emphysema Mother   . Heart disease Father   . Heart attack Father   . Cancer Maternal Grandmother         Bone  . Cancer Paternal Grandfather   . Alcohol abuse Paternal Uncle   . Alcohol abuse Maternal Grandfather     Social History:   Social History   Social History  . Marital status: Married    Spouse name: N/A  . Number of children: N/A  . Years of education: HS Grad   Occupational History  . Full-Time     [employer]   Social History Main Topics  . Smoking status: Never Smoker  . Smokeless tobacco: Former Systems developer    Types: Chew    Quit date: 11/24/2004  . Alcohol use 21.0 oz/week    21 Cans of beer, 14 Standard drinks or equivalent per week     Comment: occasional  . Drug use: No  . Sexual activity: Not Asked   Other Topics Concern  . None   Social History Narrative  . None    Additional Social History: Patient is married since the past 26 yrs, has two children. He was born in [place], [place], raised by both parents , they both are deceased. He has been working for the same company - [employer] since the past 20 yrs or so, recently promoted to higher post.   Allergies:   Allergies  Allergen Reactions  . Niacin     Flushing    Metabolic Disorder Labs: Lab Results  Component Value Date   HGBA1C 5.4 09/26/2016   No results found for: PROLACTIN Lab Results  Component Value Date   CHOL 221 (H) 09/26/2016   TRIG 235 (H) 09/26/2016   HDL 47 09/26/2016   CHOLHDL 4.7 09/26/2016   LDLCALC 127 (H) 09/26/2016   LDLCALC 91 02/21/2014     Current Medications: Current Outpatient Prescriptions  Medication Sig Dispense Refill  . allopurinol (ZYLOPRIM) 100 MG tablet TAKE 1 TABLET (100 MG TOTAL) BY MOUTH 2 (TWO) TIMES DAILY. 180 tablet 3  . fexofenadine (ALLEGRA) 180 MG tablet Take 180 mg by mouth daily.    . fluocinonide cream (LIDEX) 8.52 % Apply 1 application topically as needed. 30 g 1  . fluticasone (FLONASE) 50 MCG/ACT nasal spray Place 1 spray into both nostrils daily. 16 g 3  . hydrOXYzine (ATARAX/VISTARIL) 25 MG tablet Take 1 tablet (25 mg total) by  mouth 3 (three) times daily as needed for anxiety. Panic sx as well as sleep issues at bedtime 75 tablet 1  . indomethacin (INDOCIN) 50 MG capsule TAKE ONE CAPSULE BY MOUTH TWICE A DAY WITH MEAL AS NEEDED FOR GOUT 30 capsule 3  . ranitidine (ZANTAC) 75 MG tablet Take 75 mg by mouth 2 (two) times daily.    Marland Kitchen  amitriptyline (ELAVIL) 150 MG tablet Take 1 tablet (150 mg total) by mouth at bedtime. 30 tablet 1  . venlafaxine XR (EFFEXOR-XR) 37.5 MG 24 hr capsule Take 1 capsule (37.5 mg total) by mouth daily with breakfast. 30 capsule 1   No current facility-administered medications for this visit.     Neurologic: Headache: No Seizure: No Paresthesias:No  Musculoskeletal: Strength & Muscle Tone: within normal limits Gait & Station: normal Patient leans: N/A  Psychiatric Specialty Exam: Review of Systems  Psychiatric/Behavioral: The patient is nervous/anxious.   All other systems reviewed and are negative.   Blood pressure 130/85, pulse 82, temperature 98.5 F (36.9 C), temperature source Oral, weight 246 lb 9.6 oz (111.9 kg).Body mass index is 33.91 kg/m.  General Appearance: Casual  Eye Contact:  Fair  Speech:  Clear and Coherent  Volume:  Normal  Mood:  Anxious  Affect:  Appropriate  Thought Process:  Goal Directed and Descriptions of Associations: Intact  Orientation:  Full (Time, Place, and Person)  Thought Content:  Logical  Suicidal Thoughts:  No  Homicidal Thoughts:  No  Memory:  Immediate;   Fair Recent;   Fair Remote;   Fair  Judgement:  Fair  Insight:  Fair  Psychomotor Activity:  Normal  Concentration:  Concentration: Fair and Attention Span: Fair  Recall:  [employer] of Knowledge:Fair  Language: Fair  Akathisia:  Yes  Handed:  Right  AIMS (if indicated):  NA  Assets:  Communication Skills Desire for Improvement Financial Resources/Insurance [place] Talents/Skills Transportation  ADL's:  Intact  Cognition: WNL   Sleep:  Almena is a 37 y old CM , who is married ,employed , has a hx of anxiety sx , presented to [facility] , referred by his PMD. He is now on Effexor , but has sexual side effects and wonders what can be done regarding that. He has some stressors at work - recent promotion, otherwise denies any. He does have a past hx of anxiety sx since 2001 or so. He has good social support , is employed , denies any hx of depression or anxiety in his family. He denies SI - is a good candidate for outpatient treatment.  GAD - 7 - 7 PHQ9- 5  Medication management and Plan see below   Anxiety : Will increase Elavil to 150 mg po qhs . Will reduce Effexor XR to 37.5 mg po daily .  Insomnia: Will continue Elavil as noted above OSA on CPAP- discussed to talk to PMD regarding follow up. Discussed sleep hygiene.  For panic attacks: Vistaril 25 mg po tid prn . Refer to Ms.Peacock for therapy.  Discussed leisure activities , exercise .  Will order TSH since that can also affect his mood .  Will get EKG since he is on Elavil.  More than 50 % of the time was spent for psychoeducation and supportive psychotherapy as well as coordination of care.  Follow up in 4 weeks or sooner if needed.  Ursula Alert, MD 10/16/201810:30 AM

## 2017-09-08 NOTE — Patient Instructions (Signed)
Hydroxyzine capsules or tablets What is this medicine? HYDROXYZINE (hye [place] i zeen) is an antihistamine. This medicine is used to treat allergy symptoms. It is also used to treat anxiety and tension. This medicine can be used with other medicines to induce sleep before surgery. This medicine may be used for other purposes; ask your health care provider or pharmacist if you have questions. COMMON BRAND NAME(S): ANX, Atarax, Rezine, Vistaril What should I tell my health care provider before I take this medicine? They need to know if you have any of these conditions: -any chronic illness -difficulty passing urine -glaucoma -heart disease -kidney disease -liver disease -lung disease -an unusual or allergic reaction to hydroxyzine, cetirizine, other medicines, foods, dyes, or preservatives -pregnant or trying to get pregnant -breast-feeding How should I use this medicine? Take this medicine by mouth with a full glass of water. Follow the directions on the prescription label. You may take this medicine with food or on an empty stomach. Take your medicine at regular intervals. Do not take your medicine more often than directed. Talk to your pediatrician regarding the use of this medicine in children. Special care may be needed. While this drug may be prescribed for children as young as 75 years of age for selected conditions, precautions do apply. Patients over 62 years old may have a stronger reaction and need a smaller dose. Overdosage: If you think you have taken too much of this medicine contact a poison control center or emergency room at once. NOTE: This medicine is only for you. Do not share this medicine with others. What if I miss a dose? If you miss a dose, take it as soon as you can. If it is almost time for your next dose, take only that dose. Do not take double or extra doses. What may interact with this medicine? -alcohol -barbiturate medicines for sleep or seizures -medicines for  colds, allergies -medicines for depression, anxiety, or emotional disturbances -medicines for pain -medicines for sleep -muscle relaxants This list may not describe all possible interactions. Give your health care provider a list of all the medicines, herbs, non-prescription drugs, or dietary supplements you use. Also tell them if you smoke, drink alcohol, or use illegal drugs. Some items may interact with your medicine. What should I watch for while using this medicine? Tell your doctor or health care professional if your symptoms do not improve. You may get drowsy or dizzy. Do not drive, use machinery, or do anything that needs mental alertness until you know how this medicine affects you. Do not stand or sit up quickly, especially if you are an older patient. This reduces the risk of dizzy or fainting spells. Alcohol may interfere with the effect of this medicine. Avoid alcoholic drinks. Your mouth may get dry. Chewing sugarless gum or sucking hard candy, and drinking plenty of water may help. Contact your doctor if the problem does not go away or is severe. This medicine may cause dry eyes and blurred vision. If you wear contact lenses you may feel some discomfort. Lubricating drops may help. See your eye doctor if the problem does not go away or is severe. If you are receiving skin tests for allergies, tell your doctor you are using this medicine. What side effects may I notice from receiving this medicine? Side effects that you should report to your doctor or health care professional as soon as possible: -fast or irregular heartbeat -difficulty passing urine -seizures -slurred speech or confusion -tremor Side effects that  usually do not require medical attention (report to your doctor or health care professional if they continue or are bothersome): -constipation -drowsiness -fatigue -headache -stomach upset This list may not describe all possible side effects. Call your doctor for  medical advice about side effects. You may report side effects to FDA at 1-800-FDA-1088. Where should I keep my medicine? Keep out of the reach of children. Store at room temperature between 15 and 30 degrees C (59 and 86 degrees F). Keep container tightly closed. Throw away any unused medicine after the expiration date. NOTE: This sheet is a summary. It may not cover all possible information. If you have questions about this medicine, talk to your doctor, pharmacist, or health care provider.  2018 Elsevier/Gold Standard (2008-03-24 14:50:59) Amitriptyline tablets What is this medicine? AMITRIPTYLINE (a mee TRIP ti leen) is used to treat depression. This medicine may be used for other purposes; ask your health care provider or pharmacist if you have questions. COMMON BRAND NAME(S): Elavil, Vanatrip What should I tell my health care provider before I take this medicine? They need to know if you have any of these conditions: -an alcohol problem -asthma, difficulty breathing -bipolar disorder or schizophrenia -difficulty passing urine, prostate trouble -glaucoma -heart disease or previous heart attack -liver disease -over active thyroid -seizures -thoughts or plans of suicide, a previous suicide attempt, or family history of suicide attempt -an unusual or allergic reaction to amitriptyline, other medicines, foods, dyes, or preservatives -pregnant or trying to get pregnant -breast-feeding How should I use this medicine? Take this medicine by mouth with a drink of water. Follow the directions on the prescription label. You can take the tablets with or without food. Take your medicine at regular intervals. Do not take it more often than directed. Do not stop taking this medicine suddenly except upon the advice of your doctor. Stopping this medicine too quickly may cause serious side effects or your condition may worsen. A special MedGuide will be given to you by the pharmacist with each  prescription and refill. Be sure to read this information carefully each time. Talk to your pediatrician regarding the use of this medicine in children. Special care may be needed. Overdosage: If you think you have taken too much of this medicine contact a poison control center or emergency room at once. NOTE: This medicine is only for you. Do not share this medicine with others. What if I miss a dose? If you miss a dose, take it as soon as you can. If it is almost time for your next dose, take only that dose. Do not take double or extra doses. What may interact with this medicine? Do not take this medicine with any of the following medications: -arsenic trioxide -certain medicines used to regulate abnormal heartbeat or to treat other heart conditions -cisapride -droperidol -halofantrine -linezolid -MAOIs like Carbex, Eldepryl, Marplan, Nardil, and Parnate -methylene blue -other medicines for mental depression -phenothiazines like perphenazine, thioridazine and chlorpromazine -pimozide -probucol -procarbazine -sparfloxacin -St. John's Wort -ziprasidone This medicine may also interact with the following medications: -atropine and related drugs like hyoscyamine, scopolamine, tolterodine and others -barbiturate medicines for inducing sleep or treating seizures, like phenobarbital -cimetidine -disulfiram -ethchlorvynol -thyroid hormones such as levothyroxine This list may not describe all possible interactions. Give your health care provider a list of all the medicines, herbs, non-prescription drugs, or dietary supplements you use. Also tell them if you smoke, drink alcohol, or use illegal drugs. Some items may interact with your medicine. What should  I watch for while using this medicine? Tell your doctor if your symptoms do not get better or if they get worse. Visit your doctor or health care professional for regular checks on your progress. Because it may take several weeks to see the  full effects of this medicine, it is important to continue your treatment as prescribed by your doctor. Patients and their families should watch out for new or worsening thoughts of suicide or depression. Also watch out for sudden changes in feelings such as feeling anxious, agitated, panicky, irritable, hostile, aggressive, impulsive, severely restless, overly excited and hyperactive, or not being able to sleep. If this happens, especially at the beginning of treatment or after a change in dose, call your health care professional. Dennis Bast may get drowsy or dizzy. Do not drive, use machinery, or do anything that needs mental alertness until you know how this medicine affects you. Do not stand or sit up quickly, especially if you are an older patient. This reduces the risk of dizzy or fainting spells. Alcohol may interfere with the effect of this medicine. Avoid alcoholic drinks. Do not treat yourself for coughs, colds, or allergies without asking your doctor or health care professional for advice. Some ingredients can increase possible side effects. Your mouth may get dry. Chewing sugarless gum or sucking hard candy, and drinking plenty of water will help. Contact your doctor if the problem does not go away or is severe. This medicine may cause dry eyes and blurred vision. If you wear contact lenses you may feel some discomfort. Lubricating drops may help. See your eye doctor if the problem does not go away or is severe. This medicine can cause constipation. Try to have a bowel movement at least every 2 to 3 days. If you do not have a bowel movement for 3 days, call your doctor or health care professional. This medicine can make you more sensitive to the sun. Keep out of the sun. If you cannot avoid being in the sun, wear protective clothing and use sunscreen. Do not use sun lamps or tanning beds/booths. What side effects may I notice from receiving this medicine? Side effects that you should report to your  doctor or health care professional as soon as possible: -allergic reactions like skin rash, itching or hives, swelling of the face, lips, or tongue -anxious -breathing problems -changes in vision -confusion -elevated mood, decreased need for sleep, racing thoughts, impulsive behavior -eye pain -fast, irregular heartbeat -feeling faint or lightheaded, falls -feeling agitated, angry, or irritable -fever with increased sweating -hallucination, loss of contact with reality -seizures -stiff muscles -suicidal thoughts or other mood changes -tingling, pain, or numbness in the feet or hands -trouble passing urine or change in the amount of urine -trouble sleeping -unusually weak or tired -vomiting -yellowing of the eyes or skin Side effects that usually do not require medical attention (report to your doctor or health care professional if they continue or are bothersome): -change in sex drive or performance -change in appetite or weight -constipation -dizziness -dry mouth -nausea -tired -tremors -upset stomach This list may not describe all possible side effects. Call your doctor for medical advice about side effects. You may report side effects to FDA at 1-800-FDA-1088. Where should I keep my medicine? Keep out of the reach of children. Store at room temperature between 20 and 25 degrees C (68 and 77 degrees F). Throw away any unused medicine after the expiration date. NOTE: This sheet is a summary. It may not cover  all possible information. If you have questions about this medicine, talk to your doctor, pharmacist, or health care provider.  2018 Elsevier/Gold Standard (2016-04-11 12:14:15) Venlafaxine extended-release capsules What is this medicine? VENLAFAXINE(VEN la fax een) is used to treat depression, anxiety and panic disorder. This medicine may be used for other purposes; ask your health care provider or pharmacist if you have questions. COMMON BRAND NAME(S): Effexor  XR What should I tell my health care provider before I take this medicine? They need to know if you have any of these conditions: -bleeding disorders -glaucoma -heart disease -high blood pressure -high cholesterol -kidney disease -liver disease -low levels of sodium in the blood -mania or bipolar disorder -seizures -suicidal thoughts, plans, or attempt; a previous suicide attempt by you or a family -take medicines that treat or prevent blood clots -thyroid disease -an unusual or allergic reaction to venlafaxine, desvenlafaxine, other medicines, foods, dyes, or preservatives -pregnant or trying to get pregnant -breast-feeding How should I use this medicine? Take this medicine by mouth with a full glass of water. Follow the directions on the prescription label. Do not cut, crush, or chew this medicine. Take it with food. If needed, the capsule may be carefully opened and the entire contents sprinkled on a spoonful of cool applesauce. Swallow the applesauce/pellet mixture right away without chewing and follow with a glass of water to ensure complete swallowing of the pellets. Try to take your medicine at about the same time each day. Do not take your medicine more often than directed. Do not stop taking this medicine suddenly except upon the advice of your doctor. Stopping this medicine too quickly may cause serious side effects or your condition may worsen. A special MedGuide will be given to you by the pharmacist with each prescription and refill. Be sure to read this information carefully each time. Talk to your pediatrician regarding the use of this medicine in children. Special care may be needed. Overdosage: If you think you have taken too much of this medicine contact a poison control center or emergency room at once. NOTE: This medicine is only for you. Do not share this medicine with others. What if I miss a dose? If you miss a dose, take it as soon as you can. If it is almost time  for your next dose, take only that dose. Do not take double or extra doses. What may interact with this medicine? Do not take this medicine with any of the following medications: -certain medicines for fungal infections like fluconazole, itraconazole, ketoconazole, posaconazole, voriconazole -cisapride -desvenlafaxine -dofetilide -dronedarone -duloxetine -levomilnacipran -linezolid -MAOIs like Carbex, Eldepryl, Marplan, Nardil, and Parnate -methylene blue (injected into a vein) -milnacipran -pimozide -thioridazine -ziprasidone This medicine may also interact with the following medications: -amphetamines -aspirin and aspirin-like medicines -certain medicines for depression, anxiety, or psychotic disturbances -certain medicines for migraine headaches like almotriptan, eletriptan, frovatriptan, naratriptan, rizatriptan, sumatriptan, zolmitriptan -certain medicines for sleep -certain medicines that treat or prevent blood clots like dalteparin, enoxaparin, warfarin -cimetidine -clozapine -diuretics -fentanyl -furazolidone -indinavir -isoniazid -lithium -metoprolol -NSAIDS, medicines for pain and inflammation, like ibuprofen or naproxen -other medicines that prolong the QT interval (cause an abnormal heart rhythm) -procarbazine -rasagiline -supplements like St. John's wort, kava kava, valerian -tramadol -tryptophan This list may not describe all possible interactions. Give your health care provider a list of all the medicines, herbs, non-prescription drugs, or dietary supplements you use. Also tell them if you smoke, drink alcohol, or use illegal drugs. Some items may interact with  your medicine. What should I watch for while using this medicine? Tell your doctor if your symptoms do not get better or if they get worse. Visit your doctor or health care professional for regular checks on your progress. Because it may take several weeks to see the full effects of this medicine, it is  important to continue your treatment as prescribed by your doctor. Patients and their families should watch out for new or worsening thoughts of suicide or depression. Also watch out for sudden changes in feelings such as feeling anxious, agitated, panicky, irritable, hostile, aggressive, impulsive, severely restless, overly excited and hyperactive, or not being able to sleep. If this happens, especially at the beginning of treatment or after a change in dose, call your health care professional. This medicine can cause an increase in blood pressure. Check with your doctor for instructions on monitoring your blood pressure while taking this medicine. You may get drowsy or dizzy. Do not drive, use machinery, or do anything that needs mental alertness until you know how this medicine affects you. Do not stand or sit up quickly, especially if you are an older patient. This reduces the risk of dizzy or fainting spells. Alcohol may interfere with the effect of this medicine. Avoid alcoholic drinks. Your mouth may get dry. Chewing sugarless gum, sucking hard candy and drinking plenty of water will help. Contact your doctor if the problem does not go away or is severe. What side effects may I notice from receiving this medicine? Side effects that you should report to your doctor or health care professional as soon as possible: -allergic reactions like skin rash, itching or hives, swelling of the face, lips, or tongue -anxious -breathing problems -confusion -changes in vision -chest pain -confusion -elevated mood, decreased need for sleep, racing thoughts, impulsive behavior -eye pain -fast, irregular heartbeat -feeling faint or lightheaded, falls -feeling agitated, angry, or irritable -hallucination, loss of contact with reality -high blood pressure -loss of balance or coordination -palpitations -redness, blistering, peeling or loosening of the skin, including inside the mouth -restlessness, pacing,  inability to keep still -seizures -stiff muscles -suicidal thoughts or other mood changes -trouble passing urine or change in the amount of urine -trouble sleeping -unusual bleeding or bruising -unusually weak or tired -vomiting Side effects that usually do not require medical attention (report to your doctor or health care professional if they continue or are bothersome): -change in sex drive or performance -change in appetite or weight -constipation -dizziness -dry mouth -headache -increased sweating -nausea -tired This list may not describe all possible side effects. Call your doctor for medical advice about side effects. You may report side effects to FDA at 1-800-FDA-1088. Where should I keep my medicine? Keep out of the reach of children. Store at a controlled temperature between 20 and 25 degrees C (68 degrees and 77 degrees F), in a dry place. Throw away any unused medicine after the expiration date. NOTE: This sheet is a summary. It may not cover all possible information. If you have questions about this medicine, talk to your doctor, pharmacist, or health care provider.  2018 [employer]vier/Gold Standard (2016-04-10 18:38:02)

## 2017-09-16 ENCOUNTER — Other Ambulatory Visit: Payer: Self-pay | Admitting: Family Medicine

## 2017-09-16 NOTE — Telephone Encounter (Signed)
Rx sent to pharmacy 09/08/2017.

## 2017-09-16 NOTE — Telephone Encounter (Signed)
[facility] pharmacy faxed a request for a 90-days supply for the following medication. Thanks CC  venlafaxine XR (EFFEXOR-XR) 37.5 MG 24 hr capsule  >Take 1 capsule ( 150 MG total ) by mouth daily with breakfast.

## 2017-09-17 ENCOUNTER — Ambulatory Visit (INDEPENDENT_AMBULATORY_CARE_PROVIDER_SITE_OTHER): Payer: BLUE CROSS/BLUE SHIELD | Admitting: Licensed Clinical Social Worker

## 2017-09-17 DIAGNOSIS — F411 Generalized anxiety disorder: Secondary | ICD-10-CM

## 2017-09-17 NOTE — Progress Notes (Signed)
Comprehensive Clinical Assessment (CCA) Note  09/17/2017 Russell Pineda [id number]  Visit Diagnosis:      ICD-10-CM   1. Generalized anxiety disorder F41.1       CCA Part One  Part One has been completed on paper by the patient.  (See scanned document in Chart Review)  CCA Part Two A  Intake/Chief Complaint:  CCA Intake With Chief Complaint CCA Part Two Date: 09/17/17 CCA Part Two Time: 0905 Chief Complaint/Presenting Problem: I am having difficulty with anxiety Patients Currently Reported Symptoms/Problems: I have a difficult time with flying and completing presentations.  I can't think, i forget words, difficulty concentrating.  I have experienced this for about a year. I been at my job for 20 years.  I don't know why I have this severe anxiety.  I have noticed some changes while on medication. I have side effects to medications. I sleep all night now.  I talk to people daily.  I have not been to a meeting lately. I am afraid the my anxiety will affect my job. Individual's Strengths: relate to people, communication, able to build relationships with others Individual's Preferences: anger towards my son Individual's Abilities: communicates well Type of Services Patient Feels Are Needed: therapy, medication management  Mental Health Symptoms Depression:  Depression: Fatigue, Difficulty Concentrating  Mania:  Mania: N/A  Anxiety:   Anxiety: Difficulty concentrating, Worrying, Fatigue, Irritability, Tension  Psychosis:  Psychosis: N/A  Trauma:  Trauma: N/A  Obsessions:  Obsessions: N/A  Compulsions:  Compulsions: N/A  Inattention:  Inattention: N/A  Hyperactivity/Impulsivity:  Hyperactivity/Impulsivity: N/A  Oppositional/Defiant Behaviors:  Oppositional/Defiant Behaviors: N/A  Borderline Personality:  Emotional Irregularity: N/A  Other Mood/Personality Symptoms:      Mental Status Exam Appearance and self-care  Stature:  Stature: Average  Weight:  Weight: Average weight   Clothing:  Clothing: Casual  Grooming:  Grooming: Well-groomed  Cosmetic use:  Cosmetic Use: None  Posture/gait:  Posture/Gait: Normal  Motor activity:  Motor Activity: Not Remarkable  Sensorium  Attention:  Attention: Normal  Concentration:  Concentration: Normal  Orientation:  Orientation: X5  Recall/memory:  Recall/Memory: Normal  Affect and Mood  Affect:  Affect: Appropriate  Mood:  Mood: Euthymic  Relating  Eye contact:  Eye Contact: Normal  Facial expression:  Facial Expression: Responsive  Attitude toward examiner:  Attitude Toward Examiner: Cooperative  Thought and Language  Speech flow: Speech Flow: Normal  Thought content:  Thought Content: Appropriate to mood and circumstances  Preoccupation:     Hallucinations:     Organization:     Transport planner of Knowledge:  Fund of Knowledge: Average  Intelligence:  Intelligence: Average  Abstraction:  Abstraction: Normal  Judgement:  Judgement: Normal  Reality Testing:  Reality Testing: Adequate  Insight:  Insight: Good  Decision Making:  Decision Making: Normal  Social Functioning  Social Maturity:  Social Maturity: Responsible  Social Judgement:  Social Judgement: Normal  Stress  Stressors:  Stressors: Work, Transitions  Coping Ability:  Coping Ability: English as a second language teacher Deficits:     Supports:      Family and Psychosocial History: Family history Marital status: Married Number of Years Married: 72 What types of issues is patient dealing with in the relationship?: denies Are you sexually active?: Yes What is your sexual orientation?: heterosexual Has your sexual activity been affected by drugs, alcohol, medication, or emotional stress?: denies Does patient have children?: Yes How many children?: 1 How is patient's relationship with their children?: i regret how i  treated him when I was younger. he lives in the home with [place] and I talk to him daily.  I want him to be more motivated.  Childhood  History:  Childhood History By whom was/is the patient raised?: Both parents Additional childhood history information: Born in [place]. Describes childhood as: didn't have a lot of money.  had a lot of close family. [place] with my maternal family. i had a good childhood. Description of patient's relationship with caregiver when they were a child: I had a good relationship growing up with both parents. we lived on a farm and I had a lot of cousins.Parents were both loving. Patient's description of current relationship with people who raised him/her: both deceased How were you disciplined when you got in trouble as a child/adolescent?: switch, grounded Does patient have siblings?: Yes Number of Siblings: 4 Description of patient's current relationship with siblings: excellent.  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 How long has patient been employed?: 57yrs Patient's job has been impacted by current illness: Yes Describe how patient's job has been impacted: unable to speak in crowds What is the longest time patient has a held a job?: 84yrs Where was the patient employed at that time?: [employer] Has patient ever been in the [employer]?: No  Education: Education Name of [place]: [employer] in [place] (did not graduate) Did Teacher, adult education From [employer]?: No Did Physicist, medical?: No Did Heritage manager?: No Did You Have Any Difficulty At [employer]?: Yes (difficulty  focusing/lack of concentration) Were Any Medications Ever Prescribed For These Difficulties?: No  Religion: Religion/Spirituality Are You A Religious Person?: Yes What is Your Religious Affiliation?: Baptist How Might This Affect Treatment?: denies  Leisure/Recreation: Leisure / Recreation Leisure and Hobbies: camping, boating, build furniture  Exercise/Diet: Exercise/Diet Do You Exercise?: No Have You Gained or Lost A Significant Amount of Weight in the Past Six Months?: No Do You Follow a Special Diet?: No Do You Have Any Trouble Sleeping?: Yes Explanation of Sleeping Difficulties: takes medication to assist with sleeping  CCA Part Two C  Alcohol/Drug Use: Alcohol / Drug Use Pain Medications: denies Prescriptions: amatriptaline, venlafaxine, (Gout medication) Over the Counter: Ibuprofen History of alcohol / drug use?: Yes Substance #1 Name of Substance 1: Alcohol 1 - Age of First Use: 24 1 - Amount (size/oz): 1 or 2; 12 ounces of beer (Michelob Ultra Light) 1 - Frequency: daily 1 - Duration: 103months 1 - Last Use / Amount: 09/16/17; 3 beers                    CCA Part Three  ASAM's:  Six Dimensions of Multidimensional Assessment  Dimension 1:  Acute Intoxication and/or Withdrawal Potential:     Dimension 2:  Biomedical Conditions and Complications:     Dimension 3:  Emotional, Behavioral, or Cognitive Conditions and Complications:     Dimension 4:  Readiness to Change:     Dimension 5:  Relapse, Continued use, or Continued Problem Potential:  Dimension 6:  Recovery/Living Environment:      Substance use Disorder (SUD)    Social Function:  Social Functioning Social Maturity: Responsible Social Judgement: Normal  Stress:  Stress Stressors: Work, Transitions Coping Ability: Overwhelmed Patient Takes Medications The Way The Doctor Instructed?: No Priority Risk: Low Acuity  Risk Assessment- Self-Harm Potential: Risk Assessment For Self-Harm  Potential Thoughts of Self-Harm: No current thoughts Method: No plan Availability of Means: No access/NA  Risk Assessment -Dangerous to Others Potential: Risk Assessment For Dangerous to Others Potential Method: No Plan Availability of Means: No access or NA Intent: Vague intent or NA Notification Required: No need or identified person  DSM5 Diagnoses: Patient Active Problem List   Diagnosis Date Noted  . Anxiety 08/18/2017  . Displacement of lumbar intervertebral disc without myelopathy 07/14/2017  . Hyperuricemia 06/12/2016  . Lipoma of abdominal wall 05/08/2015  . OSA (obstructive sleep apnea) 05/08/2015  . Allergic rhinitis 06/07/2007  . Esophageal reflux 06/07/2007  . Hyperlipemia, mixed 11/24/2006    Patient Centered Plan: Patient is on the following Treatment Plan(s):  Anxiety  Recommendations for Services/Supports/Treatments: Recommendations for Services/Supports/Treatments Recommendations For Services/Supports/Treatments: Individual Therapy, Medication Management  Treatment Plan Summary:    Referrals to Alternative Service(s): Referred to Alternative Service(s):   Place:   Date:   Time:    Referred to Alternative Service(s):   Place:   Date:   Time:    Referred to Alternative Service(s):   Place:   Date:   Time:    Referred to Alternative Service(s):   Place:   Date:   Time:     Lubertha South

## 2017-09-18 ENCOUNTER — Other Ambulatory Visit: Payer: Self-pay | Admitting: *Deleted

## 2017-09-18 DIAGNOSIS — F410 Panic disorder [episodic paroxysmal anxiety] without agoraphobia: Secondary | ICD-10-CM

## 2017-09-18 NOTE — Telephone Encounter (Signed)
Requesting 90 day supply.

## 2017-09-23 ENCOUNTER — Other Ambulatory Visit: Payer: Self-pay | Admitting: Family Medicine

## 2017-09-30 ENCOUNTER — Ambulatory Visit (INDEPENDENT_AMBULATORY_CARE_PROVIDER_SITE_OTHER): Payer: BLUE CROSS/BLUE SHIELD | Admitting: Family Medicine

## 2017-09-30 ENCOUNTER — Encounter: Payer: Self-pay | Admitting: Family Medicine

## 2017-09-30 VITALS — BP 140/102 | HR 80 | Temp 98.5°F | Resp 16 | Ht 71.5 in | Wt 250.6 lb

## 2017-09-30 DIAGNOSIS — Z0000 Encounter for general adult medical examination without abnormal findings: Secondary | ICD-10-CM | POA: Diagnosis not present

## 2017-09-30 DIAGNOSIS — G4733 Obstructive sleep apnea (adult) (pediatric): Secondary | ICD-10-CM

## 2017-09-30 DIAGNOSIS — E782 Mixed hyperlipidemia: Secondary | ICD-10-CM | POA: Diagnosis not present

## 2017-09-30 DIAGNOSIS — E790 Hyperuricemia without signs of inflammatory arthritis and tophaceous disease: Secondary | ICD-10-CM

## 2017-09-30 DIAGNOSIS — F419 Anxiety disorder, unspecified: Secondary | ICD-10-CM | POA: Diagnosis not present

## 2017-09-30 LAB — EKG 12-LEAD: EKG 12 LEAD: NORMAL

## 2017-09-30 NOTE — Patient Instructions (Addendum)
We will call you with the lab results. Encourage exercise 30 minutes daily. Come in for a nurse bp check in the next week.

## 2017-09-30 NOTE — Progress Notes (Signed)
Subjective:     Patient ID: Russell Pineda, male   DOB: 08-12-1966, 51 y.o.   MRN: [id number] Chief Complaint  Patient presents with  . Annual Exam    Patient comes in for his annual physical today he states that he feels well today and would like to request having a EKG and thyroid screen. Patient reports that he has been eating healthy but is not actively exercising. Patient last reported Tdap was 02/26/2010, he has declined flu vaccine today.   He has seen psychiatry and a therapist for anxiety and is pending f/u visit next week. He had amitriptyline increased for sleep-EKG and thyroid panel requested by his psychiatrist. HPI   Review of Systems General: Feeling well HEENT: regular dental visits and eye exams (glasses) Cardiovascular: no chest pain, shortness of breath, or palpitations. Remains on C-Pap @ 11 cm/H20 GI: occasional heartburn, no change in bowel habits or blood in the stool GU: nocturia x 0, no change in bladder habits  Psychiatric: not depressed Musculoskeletal: no joint pain, reports allopurinol controls his gout.    Objective:   Physical Exam  Constitutional: He appears well-developed and well-nourished. No distress.  Waist circumference 45"  Eyes: PERRLA Neck: no thyromegaly, tenderness or nodules, no cervical adenopathy ENT: TM's intact without inflammation; No tonsils present Lungs: Clear Heart : RRR without murmur or gallop Abd: bowel sounds present, soft, non-tender, no organomegaly Extremities: no edema Skin: no atypical lesions noted on his back. Non-specific area of mild erythema on his left upper back c/w irritation     Assessment:    1. OSA (obstructive sleep apnea): continue C-Pap  2. Anxiety: per psychiatry/therapist - EKG 12-Lead  3. Annual physical exam - COMPLETE METABOLIC PANEL WITH GFR - Hemoglobin A1c - T4, free - TSH  4. Hyperlipemia, mixed - Lipid panel  5. Hyperuricemia - Uric acid     Plan:    Further f/u pending lab work.  Encouraged regular exercise and decreasing alcohol consumption. Nurse bp check in the next week.

## 2017-10-01 ENCOUNTER — Telehealth: Payer: Self-pay

## 2017-10-01 LAB — HEMOGLOBIN A1C
HEMOGLOBIN A1C: 5.3 %{Hb} (ref <5.7)
Mean Plasma Glucose: 105 (calc)
eAG (mmol/L): 5.8 (calc)

## 2017-10-01 LAB — LIPID PANEL
Cholesterol: 250 mg/dL — ABNORMAL HIGH (ref <200)
HDL: 58 mg/dL (ref >40)
LDL Cholesterol (Calc): 155 mg/dL (calc) — ABNORMAL HIGH
NON-HDL CHOLESTEROL (CALC): 192 mg/dL — AB (ref <130)
Total CHOL/HDL Ratio: 4.3 (calc) (ref <5.0)
Triglycerides: 208 mg/dL — ABNORMAL HIGH (ref <150)

## 2017-10-01 LAB — COMPLETE METABOLIC PANEL WITH GFR
AG RATIO: 1.7 (calc) (ref 1.0–2.5)
ALKALINE PHOSPHATASE (APISO): 36 U/L — AB (ref 40–115)
ALT: 23 U/L (ref 9–46)
AST: 22 U/L (ref 10–40)
Albumin: 4.4 g/dL (ref 3.6–5.1)
BILIRUBIN TOTAL: 0.6 mg/dL (ref 0.2–1.2)
BUN: 16 mg/dL (ref 7–25)
CALCIUM: 9.6 mg/dL (ref 8.6–10.3)
CHLORIDE: 104 mmol/L (ref 98–110)
CO2: 27 mmol/L (ref 20–32)
Creat: 0.83 mg/dL (ref 0.60–1.35)
GFR, EST NON AFRICAN AMERICAN: 103 mL/min/{1.73_m2} (ref >=60)
GFR, Est African American: 120 mL/min/{1.73_m2} (ref >=60)
GLOBULIN: 2.6 g/dL (ref 1.9–3.7)
Glucose, Bld: 102 mg/dL — ABNORMAL HIGH (ref 65–99)
POTASSIUM: 4.3 mmol/L (ref 3.5–5.3)
SODIUM: 138 mmol/L (ref 135–146)
Total Protein: 7 g/dL (ref 6.1–8.1)

## 2017-10-01 LAB — TSH: TSH: 2.51 mIU/L (ref 0.40–4.50)

## 2017-10-01 LAB — URIC ACID: URIC ACID, SERUM: 6.6 mg/dL (ref 4.0–8.0)

## 2017-10-01 LAB — T4, FREE: Free T4: 0.9 ng/dL (ref 0.8–1.8)

## 2017-10-01 NOTE — Telephone Encounter (Signed)
-----   Message from Carmon Ginsberg, [place] sent at 10/01/2017  7:51 AM EST ----- Mild cholesterol and sugar elevations. Your calculated 10 year risk of developing cardiovascular disease is still low @ 4.3%. This risk goes up with age. Encourage regular exercise and blood pressure control if it remains elevated when we recheck it.

## 2017-10-01 NOTE — Telephone Encounter (Signed)
Wife is listed on DPR and she was advised of lab results since patient was not at home. KW

## 2017-10-06 ENCOUNTER — Ambulatory Visit: Payer: BLUE CROSS/BLUE SHIELD | Admitting: Licensed Clinical Social Worker

## 2017-10-07 ENCOUNTER — Other Ambulatory Visit: Payer: Self-pay

## 2017-10-07 ENCOUNTER — Ambulatory Visit (INDEPENDENT_AMBULATORY_CARE_PROVIDER_SITE_OTHER): Payer: BLUE CROSS/BLUE SHIELD | Admitting: Psychiatry

## 2017-10-07 ENCOUNTER — Encounter: Payer: Self-pay | Admitting: Psychiatry

## 2017-10-07 VITALS — BP 128/85 | HR 83 | Temp 97.7°F | Wt 245.6 lb

## 2017-10-07 DIAGNOSIS — F410 Panic disorder [episodic paroxysmal anxiety] without agoraphobia: Secondary | ICD-10-CM

## 2017-10-07 DIAGNOSIS — Z9989 Dependence on other enabling machines and devices: Secondary | ICD-10-CM

## 2017-10-07 DIAGNOSIS — F411 Generalized anxiety disorder: Secondary | ICD-10-CM | POA: Diagnosis not present

## 2017-10-07 DIAGNOSIS — G4733 Obstructive sleep apnea (adult) (pediatric): Secondary | ICD-10-CM | POA: Diagnosis not present

## 2017-10-07 MED ORDER — AMITRIPTYLINE HCL 150 MG PO TABS: 150.0000 mg | ORAL_TABLET | Freq: Every day | ORAL | 1 refills | Status: DC

## 2017-10-07 NOTE — Progress Notes (Unsigned)
Patient comes in office today for CMA blood pressure check, patient states that he feels well and has no questions or concerns. Patients vitals were taken and blood pressure was reviewed with PCP.

## 2017-10-07 NOTE — Patient Instructions (Addendum)
Start Taking Effexor XR 37.5 mg every other day starting tomorrow to wean yourself off of it.  Continue CBT with Elmyra Ricks.  Follow up in 4 weeks or sooner if needed.  Keep a log of your BP at home .

## 2017-10-07 NOTE — Progress Notes (Signed)
[facility] OP Progress Note  10/07/2017 12:56 PM Russell Pineda  MRN:  [id number]  Chief Complaint: ' I am sleeping better."   Chief Complaint    Follow-up; Medication Refill     HPI: Russell Pineda is a 51 year old Caucasian male who is employed, lives in [place], has a history of anxiety, OSA , impaired hearing, presented to our clinic today for a follow-up appointment.  Russell Pineda today reports that he has been sleeping better on the amitriptyline and that is a big change for him.  He reports that he is able to sleep through the night and does not wake up early anymore.  He reports some control of his anxiety on the amitriptyline.  However he reports that his work situation continues to be very stressful and very busy, he finds it difficult to stay focused and also has some anxiety issues.  He reports that he continues to take the Effexor the low-dose.  He would like to come off of it at some point.  But he also reports that he has been noticing some side effects to the medications like dry mouth and is concerned about the same.  He has been using Biotene mouthwash to help with the same.  He reports he is also concerned about his elevated blood pressure .  He reports that he checked it at his doctor's office couple of times and it varies.  Even this morning at the clinic here it was kind of elevated at 141/86.  Discussed with him to get his blood pressure rechecked here in clinic.  Discussed with him that a lot of factors could be contributing to his blood pressure elevation including his weight, his sleep apnea as well as medications like Effexor.  He continues to follow-up with Ms. Russell Pineda for therapy.  He reports that he tried some of the relaxation techniques that was suggested, however he felt like he did not benefit from the same.  Discussed with him to follow-up with therapist on a more intensive basis as well as to communicate with therapist about his needs.  Also discussed to give  medications as well as therapy more time.  Denies any other concerns today.   Visit Diagnosis:    ICD-10-CM   1. Generalized anxiety disorder F41.1 amitriptyline (ELAVIL) 150 MG tablet  2. Panic attacks F41.0   3. OSA on CPAP G47.33    Z99.89     Past Psychiatric History: History of anxiety and panic symptoms-has been treated with antidepressant in the past-as far as 2001.  Denies past history of suicide attempts.  Denies IP admissions.  History of being on medications like Zoloft-tinnitus, BuSpar.  Past Medical History:  Past Medical History:  Diagnosis Date  . Acute stress disorder   . Allergic rhinitis   . Esophageal reflux   . Gout 2016  . Hearing loss 2001  . Hypercholesterolemia   . Hyperlipidemia   . Lipoma of abdominal wall   . Lumbago   . Multiple benign nevi   . OSA (obstructive sleep apnea)     Past Surgical History:  Procedure Laterality Date  . HERNIA REPAIR    . orchidectomy     left  . TONSILLECTOMY      Family Psychiatric History: Paternal uncle-alcoholism, maternal grandfather-alcoholism.  Family History:  Family History  Problem Relation Age of Onset  . Emphysema Mother   . Heart disease Father   . Heart attack Father   . Cancer Maternal Grandmother  Bone  . Cancer Paternal Grandfather   . Alcohol abuse Paternal Uncle   . Alcohol abuse Maternal Grandfather     Social History:He is married since the past 26 years, has 2 children.  He was born in [place], [place], raised by both parents, they both are deceased.  He has been working for the same [place] since the past 20 years or so, recently promoted to a higher post. Social History   Socioeconomic History  . Marital status: Married    Spouse name: None  . Number of children: None  . Years of education: HS Grad  . Highest education level: None  Social Needs  . Financial resource strain: Not hard at all  . Food insecurity - worry: Never true  . Food insecurity -  inability: Never true  . Transportation needs - medical: No  . Transportation needs - non-medical: No  Occupational History  . Occupation: Full-Time    Comment: [employer]  Tobacco Use  . Smoking status: Never Smoker  . Smokeless tobacco: Former Systems developer    Types: Chew  Substance and Sexual Activity  . Alcohol use: Yes    Alcohol/week: 21.0 oz    Types: 21 Cans of Pineda, 14 Standard drinks or equivalent per week    Comment: occasional  . Drug use: No  . Sexual activity: Yes  Other Topics Concern  . None  Social History Narrative  . None    Allergies:  Allergies  Allergen Reactions  . Niacin     Flushing    Metabolic Disorder Labs: Lab Results  Component Value Date   HGBA1C 5.3 09/30/2017   MPG 105 09/30/2017   No results found for: PROLACTIN Lab Results  Component Value Date   CHOL 250 (H) 09/30/2017   TRIG 208 (H) 09/30/2017   HDL 58 09/30/2017   CHOLHDL 4.3 09/30/2017   LDLCALC 127 (H) 09/26/2016   LDLCALC 91 02/21/2014   Lab Results  Component Value Date   TSH 2.51 09/30/2017    Therapeutic Level Labs: No results found for: LITHIUM No results found for: VALPROATE No components found for:  CBMZ  Current Medications: Current Outpatient Medications  Medication Sig Dispense Refill  . allopurinol (ZYLOPRIM) 100 MG tablet TAKE 1 TABLET (100 MG TOTAL) BY MOUTH 2 (TWO) TIMES DAILY. 180 tablet 3  . amitriptyline (ELAVIL) 150 MG tablet Take 1 tablet (150 mg total) at bedtime by mouth. 30 tablet 1  . fexofenadine (ALLEGRA) 180 MG tablet Take 180 mg by mouth daily.    . fluocinonide cream (LIDEX) 5.42 % Apply 1 application topically as needed. 30 g 1  . fluticasone (FLONASE) 50 MCG/ACT nasal spray PLACE 1 SPRAY INTO BOTH NOSTRILS DAILY. 16 g 5  . hydrOXYzine (ATARAX/VISTARIL) 25 MG tablet Take 1 tablet (25 mg total) by mouth 3 (three) times daily as needed for anxiety. Panic sx as well as sleep issues at bedtime 75 tablet 1  . indomethacin (INDOCIN) 50 MG  capsule TAKE ONE CAPSULE BY MOUTH TWICE A DAY WITH MEAL AS NEEDED FOR GOUT 30 capsule 3  . ranitidine (ZANTAC) 75 MG tablet Take 75 mg by mouth 2 (two) times daily.    Marland Kitchen venlafaxine XR (EFFEXOR-XR) 37.5 MG 24 hr capsule Take 1 capsule (37.5 mg total) by mouth daily with breakfast. 30 capsule 1   No current facility-administered medications for this visit.      Musculoskeletal: Strength & Muscle Tone: within normal limits Gait & Station: normal Patient leans: N/A  Psychiatric Specialty Exam: Review of Systems  HENT:       Dry mouth  Psychiatric/Behavioral: The patient is nervous/anxious.   All other systems reviewed and are negative.   Blood pressure 128/85, pulse 83, temperature 97.7 F (36.5 C), temperature source Oral, weight 245 lb 9.6 oz (111.4 kg).Body mass index is 33.78 kg/m.  General Appearance: Casual  Eye Contact:  Fair  Speech:  Normal Rate  Volume:  Normal  Mood:  Anxious  Affect:  Appropriate  Thought Process:  Goal Directed and Descriptions of Associations: Intact  Orientation:  Full (Time, Place, and Person)  Thought Content: Logical   Suicidal Thoughts:  No  Homicidal Thoughts:  No  Memory:  Immediate;   Fair Recent;   Fair Remote;   Fair  Judgement:  Fair  Insight:  Fair  Psychomotor Activity:  Normal  Concentration:  Concentration: Fair and Attention Span: Fair  Recall:  [employer] of Knowledge: Fair  Language: Fair  Akathisia:  No  Handed:  Right  AIMS (if indicated): NA  Assets:  Communication Skills Desire for [place] Talents/Skills Transportation Vocational/Educational  ADL's:  Intact  Cognition: WNL  Sleep:  Fair   Screenings: PHQ2-9     Office Visit from 09/30/2017 in [facility] Office Visit from 09/26/2016 in [facility]  PHQ-2 Total Score  0  0       Assessment and Plan: 51 year old Caucasian male who is married, employed has a history of  anxiety symptoms who presented to the clinic today for a follow-up visit.  He reports that work continues to be stressful.  His sleep is improved.  He continues to struggle with some anxiety symptoms.  He reports that he did not even try the hydroxyzine as needed.  But he is willing to try that.  He will also continue CBT with Ms. [place] Piedra.  He continues to be a good candidate for outpatient treatment.  Plan as noted below.  Plan Anxiety Elavil 150 mg p.o. nightly Will wean off  Effexor XR 37.5 mg p.o. daily Provided written instructions to do so.  Insomnia Elavil 150 mg p.o. nightly OSA on CPAP-reports he is compliant  Panic attacks Continue CBT Elavil 150 mg p.o. Nightly Vistaril 25 mg po as needed.  Reviewed TSH in EHR done 09/30/2017-within normal limits EKG reviewed - QTC within normal limits - 09/30/2017.  Discussed using Biotene mouthwash, lozenges as well as increasing fluid intake to address dry mouth.  Discussed to keep log of his blood pressure at home.  On recheck of his blood pressure today it came down to within normal limits as documented above.  More than 50 % of the time was spent for psychoeducation and supportive psychotherapy and care coordination.  This note was generated in part or whole with voice recognition software. Voice recognition is usually quite accurate but there are transcription errors that can and very often do occur. I apologize for any typographical errors that were not detected and corrected.      Ursula Alert, MD 10/07/2017, 12:56 PM

## 2017-10-09 ENCOUNTER — Ambulatory Visit (INDEPENDENT_AMBULATORY_CARE_PROVIDER_SITE_OTHER): Payer: BLUE CROSS/BLUE SHIELD | Admitting: Licensed Clinical Social Worker

## 2017-10-09 DIAGNOSIS — F411 Generalized anxiety disorder: Secondary | ICD-10-CM | POA: Diagnosis not present

## 2017-10-12 NOTE — Progress Notes (Signed)
   THERAPIST PROGRESS NOTE  Session Time: 51min  Participation Level: Active  Behavioral Response: Neat and Well GroomedAlertAnxious  Type of Therapy: Individual Therapy  Treatment Goals addressed: Coping  Interventions: CBT, Motivational Interviewing and Supportive  Summary: Russell Pineda is a 51 y.o. male who presents with continued symptoms of his diagnosis.  Patient reports a current "favorable " mood. He reports that he gets really nervous in front of a crowd.  He reports that he will lose his train of thought and will become unable to concentrate.  He reports that he stresses out only about work.  He reports that he attempts to have a "normal" life on the weekends at his [place] in [place] with his wife.  He reports that his phone constantly rings and he is fearful that he will lose an account at work.  He reports that he has attempted to use the deep breathing techniques previous taught.  Factors that contribute to client's ongoing depressive symptoms were discussed and include real and perceived feelings of isolation, criticism, rejection, shame and guilt.    Suicidal/Homicidal: No  Therapist Response: Therapist actively listened as Patient reported current mood and discussed stressors. Processed with patient about her symptoms and behaviors.  Assisted Patient with understanding his diagnosis.  Discussed progress and regression while at work and at home.  Discussed self awareness and the importance of understanding body cues. Explored the need of self care and being able to look forward to fun activities.    Plan: Return again in 4 weeks.  Diagnosis: Axis I: Generalized Anxiety Disorder    Axis II: No diagnosis    Lubertha South, LCSW 10/12/2017

## 2017-10-26 DIAGNOSIS — M9903 Segmental and somatic dysfunction of lumbar region: Secondary | ICD-10-CM | POA: Diagnosis not present

## 2017-10-26 DIAGNOSIS — M5106 Intervertebral disc disorders with myelopathy, lumbar region: Secondary | ICD-10-CM | POA: Diagnosis not present

## 2017-11-04 ENCOUNTER — Ambulatory Visit (INDEPENDENT_AMBULATORY_CARE_PROVIDER_SITE_OTHER): Payer: BLUE CROSS/BLUE SHIELD | Admitting: Psychiatry

## 2017-11-04 ENCOUNTER — Other Ambulatory Visit: Payer: Self-pay

## 2017-11-04 ENCOUNTER — Encounter: Payer: Self-pay | Admitting: Psychiatry

## 2017-11-04 VITALS — BP 135/84 | HR 91 | Temp 97.5°F | Wt 247.4 lb

## 2017-11-04 DIAGNOSIS — H9313 Tinnitus, bilateral: Secondary | ICD-10-CM

## 2017-11-04 DIAGNOSIS — R41840 Attention and concentration deficit: Secondary | ICD-10-CM

## 2017-11-04 DIAGNOSIS — Z9989 Dependence on other enabling machines and devices: Secondary | ICD-10-CM

## 2017-11-04 DIAGNOSIS — F410 Panic disorder [episodic paroxysmal anxiety] without agoraphobia: Secondary | ICD-10-CM | POA: Diagnosis not present

## 2017-11-04 DIAGNOSIS — F411 Generalized anxiety disorder: Secondary | ICD-10-CM

## 2017-11-04 DIAGNOSIS — G4733 Obstructive sleep apnea (adult) (pediatric): Secondary | ICD-10-CM | POA: Diagnosis not present

## 2017-11-04 MED ORDER — AMITRIPTYLINE HCL 150 MG PO TABS: 150.0000 mg | ORAL_TABLET | Freq: Every day | ORAL | 1 refills | Status: DC

## 2017-11-04 MED ORDER — GUANFACINE HCL ER 1 MG PO TB24: 1.0000 mg | ORAL_TABLET | Freq: Every day | ORAL | 1 refills | Status: DC

## 2017-11-04 NOTE — Patient Instructions (Signed)
Tinnitus Tinnitus refers to hearing a sound when there is no actual source for that sound. This is often described as ringing in the ears. However, people with this condition may hear a variety of noises. A person may hear the sound in one ear or in both ears. The sounds of tinnitus can be soft, loud, or somewhere in between. Tinnitus can last for a few seconds or can be constant for days. It may go away without treatment and come back at various times. When tinnitus is constant or happens often, it can lead to other problems, such as trouble sleeping and trouble concentrating. Almost everyone experiences tinnitus at some point. Tinnitus that is long-lasting (chronic) or comes back often is a problem that may require medical attention. What are the causes? The cause of tinnitus is often not known. In some cases, it can result from other problems or conditions, including:  Exposure to loud noises from machinery, music, or other sources.  Hearing loss.  Ear or sinus infections.  Earwax buildup.  A foreign object in the ear.  Use of certain medicines.  Use of alcohol and caffeine.  High blood pressure.  Heart diseases.  Anemia.  Allergies.  Meniere disease.  Thyroid problems.  Tumors.  An enlarged part of a weakened blood vessel (aneurysm).  What are the signs or symptoms? The main symptom of tinnitus is hearing a sound when there is no source for that sound. It may sound like:  Buzzing.  Roaring.  Ringing.  Blowing air, similar to the sound heard when you listen to a seashell.  Hissing.  Whistling.  Sizzling.  Humming.  Running water.  A sustained musical note.  How is this diagnosed? Tinnitus is diagnosed based on your symptoms. Your health care provider will do a physical exam. A comprehensive hearing exam (audiologic exam) will be done if your tinnitus:  Affects only one ear (unilateral).  Causes hearing difficulties.  Lasts 6 months or  longer.  You may also need to see a health care provider who specializes in hearing disorders (audiologist). You may be asked to complete a questionnaire to determine the severity of your tinnitus. Tests may be done to help determine the cause and to rule out other conditions. These can include:  Imaging studies of your head and brain, such as: ? A CT scan. ? An MRI.  An imaging study of your blood vessels (angiogram).  How is this treated? Treating an underlying medical condition can sometimes make tinnitus go away. If your tinnitus continues, other treatments may include:  Medicines, such as certain antidepressants or sleeping aids.  Sound generators to mask the tinnitus. These include: ? Tabletop sound machines that play relaxing sounds to help you fall asleep. ? Wearable devices that fit in your ear and play sounds or music. ? A small device that uses headphones to deliver a signal embedded in music (acoustic neural stimulation). In time, this may change the pathways of your brain and make you less sensitive to tinnitus. This device is used for very severe cases when no other treatment is working.  Therapy and counseling to help you manage the stress of living with tinnitus.  Using hearing aids or cochlear implants, if your tinnitus is related to hearing loss.  Follow these instructions at home:  When possible, avoid being in loud places and being exposed to loud sounds.  Wear hearing protection, such as earplugs, when you are exposed to loud noises.  Do not take stimulants, such as nicotine,   alcohol, or caffeine.  Practice techniques for reducing stress, such as meditation, yoga, or deep breathing.  Use a white noise machine, a humidifier, or other devices to mask the sound of tinnitus.  Sleep with your head slightly raised. This may reduce the impact of tinnitus.  Try to get plenty of rest each night. Contact a health care provider if:  You have tinnitus in just one  ear.  Your tinnitus continues for 3 weeks or longer without stopping.  Home care measures are not helping.  You have tinnitus after a head injury.  You have tinnitus along with any of the following: ? Dizziness. ? Loss of balance. ? Nausea and vomiting. This information is not intended to replace advice given to you by your health care provider. Make sure you discuss any questions you have with your health care provider. Document Released: 11/10/2005 Document Revised: 07/13/2016 Document Reviewed: 04/12/2014 Elsevier Interactive Patient Education  2018 [place]. Guanfacine extended-release oral tablets What is this medicine? GUANFACINE [facility] fa seen) is used to treat attention-deficit hyperactivity disorder (ADHD). This medicine may be used for other purposes; ask your health care provider or pharmacist if you have questions. COMMON BRAND NAME(S): Intuniv What should I tell my health care provider before I take this medicine? They need to know if you have any of these conditions: -kidney disease -liver disease -low blood pressure or slow heart rate -an unusual or allergic reaction to guanfacine, other medicines, foods, dyes, or preservatives -pregnant or trying to get pregnant -breast-feeding How should I use this medicine? Take this medicine by mouth with a glass of water. Follow the directions on the prescription label. Do not cut, crush, or chew this medicine. Do not take this medicine with a high-fat meal. Take your medicine at regular intervals. Do not take it more often than directed. Do not stop taking except on your doctor's advice. Stopping this medicine too quickly may cause serious side effects. Ask your doctor or health care professional for advice. This drug may be prescribed for children as young as 6 years. Talk to your doctor if you have any questions. Overdosage: If you think you have taken too much of this medicine contact a poison control center or emergency  room at once. NOTE: This medicine is only for you. Do not share this medicine with others. What if I miss a dose? If you miss a dose, take it as soon as you can. If it is almost time for your next dose, take only that dose. Do not take double or extra doses. If you miss 2 or more doses in a row, you should contact your doctor or health care professional. You may need to restart your medicine at a lower dose. What may interact with this medicine? -certain medicines for blood pressure, heart disease, irregular heart beat -certain medicines for depression, anxiety, or psychotic disturbances -certain medicines for seizures like carbamazepine, phenobarbital, phenytoin -certain medicines for sleep -ketoconazole -narcotic medicines for pain -rifampin This list may not describe all possible interactions. Give your health care provider a list of all the medicines, herbs, non-prescription drugs, or dietary supplements you use. Also tell them if you smoke, drink alcohol, or use illegal drugs. Some items may interact with your medicine. What should I watch for while using this medicine? Visit your doctor or health care professional for regular checks on your progress. Check your heart rate and blood pressure as directed. Ask your doctor or health care professional what your heart rate and  blood pressure should be and when you should contact him or her. You may get dizzy or drowsy. Do not drive, use machinery, or do anything that needs mental alertness until you know how this medicine affects you. Do not stand or sit up quickly, especially if you are an older patient. This reduces the risk of dizzy or fainting spells. Alcohol can make you more drowsy and dizzy. Avoid alcoholic drinks. Avoid becoming dehydrated or overheated while taking this medicine. Your mouth may get dry. Chewing sugarless gum or sucking hard candy, and drinking plenty of water may help. Contact your doctor if the problem does not go away or  is severe. What side effects may I notice from receiving this medicine? Side effects that you should report to your doctor or health care professional as soon as possible: -allergic reactions like skin rash, itching or hives, swelling of the face, lips, or tongue -changes in emotions or moods -chest pain or chest tightness -signs and symptoms of low blood pressure like dizziness; feeling faint or lightheaded, falls; unusually weak or tired -unusually slow heartbeat Side effects that usually do not require medical attention (report to your doctor or health care professional if they continue or are bothersome): -drowsiness -dry mouth -headache -nausea -tiredness This list may not describe all possible side effects. Call your doctor for medical advice about side effects. You may report side effects to FDA at 1-800-FDA-1088. Where should I keep my medicine? Keep out of the reach of children. Store at room temperature between 15 and 30 degrees C (59 and 86 degrees F). Throw away any unused medicine after the expiration date. NOTE: This sheet is a summary. It may not cover all possible information. If you have questions about this medicine, talk to your doctor, pharmacist, or health care provider.  2018 Elsevier/Gold Standard (2016-12-11 12:45:57)

## 2017-11-04 NOTE — Progress Notes (Signed)
[facility] MD OP Progress Note  11/04/2017 1:02 PM Russell Pineda  MRN:  [id number]  Chief Complaint: ' I still have some anxiety sx.'  Chief Complaint    Follow-up; Medication Refill     HPI: Russell Pineda is a 51 year old Caucasian male, who is employed, lives in [place], has a history of anxiety, OSA, impaired hearing, presented to the clinic today for a follow-up appointment.  Patient today reports that the Elavil has been helping him with his sleep.  He reports that he is tolerating the 150 mg dose well.  He reports his panic attacks are more under control now.  He however does have periods when he cannot focus.  Feels like his mind goes all over the place and cannot stay on one task.  He also reports times when he is anxious and agitated and his blood pressure goes high .  He reports that his blood pressure comes down when he relaxes and is able to calm down.  He also reports tinnitus of his ears.  Reports that he has had this symptoms since 2001 or so.  He reports that it started for the first time when he stopped taking a medication called BuSpar which was started by his provider at that time.  He had an ENT evaluation done, but they thought it could be due to loud noise.  He reports tinnitus may be worsening now.  Discussed with patient the effect of antidepressants on tinnitus.  Discussed with him to monitor it closely.  Will also follow-up with his PMD for the same.  He reports he continues to be compliant on his CPAP.  He looks forward to spending time with his family during the holidays.  He continues to follow-up with his counselor for psychotherapy.  Reports it is going okay.   Visit Diagnosis:    ICD-10-CM   1. Generalized anxiety disorder F41.1 amitriptyline (ELAVIL) 150 MG tablet  2. Panic attack F41.0   3. OSA on CPAP G47.33    Z99.89   4. Attention and concentration deficit R41.840 guanFACINE (INTUNIV) 1 MG TB24 ER tablet  5. Tinnitus of both ears H93.13     Past  Psychiatric History: For anxiety and panic symptoms-has been treated with antidepressant in the past-as well as 2001.  Denies past history of suicide attempts.  Denies IP admissions.  History of being on medications like Zoloft-tinnitus-BuSpar.  Past Medical History:  Past Medical History:  Diagnosis Date  . Acute stress disorder   . Allergic rhinitis   . Esophageal reflux   . Gout 2016  . Hearing loss 2001  . Hypercholesterolemia   . Hyperlipidemia   . Lipoma of abdominal wall   . Lumbago   . Multiple benign nevi   . OSA (obstructive sleep apnea)     Past Surgical History:  Procedure Laterality Date  . HERNIA REPAIR    . orchidectomy     left  . TONSILLECTOMY      Family Psychiatric History: Paternal uncle -alcoholism, maternal grandfather-alcoholism.  Family History:  Family History  Problem Relation Age of Onset  . Emphysema Mother   . Heart disease Father   . Heart attack Father   . Cancer Maternal Grandmother        Bone  . Cancer Paternal Grandfather   . Alcohol abuse Paternal Uncle   . Alcohol abuse Maternal Grandfather     Social History: Patient is married since the past 6 years-has 2 children.  He was born in [place],  [place], raised by both parents-they both are deceased.  He has been working for the same [employer] since the past 20 years or so, recently promoted to a higher post Social History   Socioeconomic History  . Marital status: Married    Spouse name: None  . Number of children: None  . Years of education: HS Grad  . Highest education level: None  Social Needs  . Financial resource strain: Not hard at all  . Food insecurity - worry: Never true  . Food insecurity - inability: Never true  . Transportation needs - medical: No  . Transportation needs - non-medical: No  Occupational History  . Occupation: Full-Time    Comment: [employer]  Tobacco Use  . Smoking status: Never Smoker  . Smokeless tobacco: Former Systems developer     Types: Chew  Substance and Sexual Activity  . Alcohol use: Yes    Alcohol/week: 21.0 oz    Types: 21 Cans of beer, 14 Standard drinks or equivalent per week    Comment: occasional  . Drug use: No  . Sexual activity: Yes  Other Topics Concern  . None  Social History Narrative  . None    Allergies:  Allergies  Allergen Reactions  . Niacin     Flushing    Metabolic Disorder Labs: Lab Results  Component Value Date   HGBA1C 5.3 09/30/2017   MPG 105 09/30/2017   No results found for: PROLACTIN Lab Results  Component Value Date   CHOL 250 (H) 09/30/2017   TRIG 208 (H) 09/30/2017   HDL 58 09/30/2017   CHOLHDL 4.3 09/30/2017   LDLCALC 127 (H) 09/26/2016   LDLCALC 91 02/21/2014   Lab Results  Component Value Date   TSH 2.51 09/30/2017    Therapeutic Level Labs: No results found for: LITHIUM No results found for: VALPROATE No components found for:  CBMZ  Current Medications: Current Outpatient Medications  Medication Sig Dispense Refill  . allopurinol (ZYLOPRIM) 100 MG tablet TAKE 1 TABLET (100 MG TOTAL) BY MOUTH 2 (TWO) TIMES DAILY. 180 tablet 3  . amitriptyline (ELAVIL) 150 MG tablet Take 1 tablet (150 mg total) by mouth at bedtime. 30 tablet 1  . fexofenadine (ALLEGRA) 180 MG tablet Take 180 mg by mouth daily.    . fluocinonide cream (LIDEX) 3.14 % Apply 1 application topically as needed. 30 g 1  . fluticasone (FLONASE) 50 MCG/ACT nasal spray PLACE 1 SPRAY INTO BOTH NOSTRILS DAILY. 16 g 5  . hydrOXYzine (ATARAX/VISTARIL) 25 MG tablet Take 1 tablet (25 mg total) by mouth 3 (three) times daily as needed for anxiety. Panic sx as well as sleep issues at bedtime 75 tablet 1  . indomethacin (INDOCIN) 50 MG capsule TAKE ONE CAPSULE BY MOUTH TWICE A DAY WITH MEAL AS NEEDED FOR GOUT 30 capsule 3  . ranitidine (ZANTAC) 75 MG tablet Take 75 mg by mouth 2 (two) times daily.    Marland Kitchen guanFACINE (INTUNIV) 1 MG TB24 ER tablet Take 1 tablet (1 mg total) by mouth at bedtime. 30 tablet 1    No current facility-administered medications for this visit.      Musculoskeletal: Strength & Muscle Tone: within normal limits Gait & Station: normal Patient leans: N/A  Psychiatric Specialty Exam: Review of Systems  HENT: Positive for tinnitus.   Psychiatric/Behavioral: The patient is nervous/anxious.   All other systems reviewed and are negative.   Blood pressure 135/84, pulse 91, temperature (!) 97.5 F (36.4 C), temperature source Oral, weight 247  lb 6.4 oz (112.2 kg).Body mass index is 34.02 kg/m.  General Appearance: Casual  Eye Contact:  Fair  Speech:  Clear and Coherent  Volume:  Normal  Mood:  Anxious  Affect:  Congruent  Thought Process:  Goal Directed and Descriptions of Associations: Intact  Orientation:  Full (Time, Place, and Person)  Thought Content: Logical   Suicidal Thoughts:  No  Homicidal Thoughts:  No  Memory:  Immediate;   Fair Recent;   Fair Remote;   Fair  Judgement:  Fair  Insight:  Fair  Psychomotor Activity:  Normal  Concentration:  Concentration: Fair and Attention Span: Fair  Recall:  [employer] of Knowledge: Fair  Language: Fair  Akathisia:  No  Handed:  Right  AIMS (if indicated): NA  Assets:  Communication Skills Desire for [place] Talents/Skills Transportation Vocational/Educational  ADL's:  Intact  Cognition: WNL  Sleep:  Fair   Screenings: PHQ2-9     Office Visit from 09/30/2017 in [facility] Visit from 09/26/2016 in [facility]  PHQ-2 Total Score  0  0       Assessment and Plan: Deshannon is a 51 year old Caucasian male who is married, employed, has a history of anxiety symptoms who presented to the clinic today for a follow-up visit.  Patient continues to have some attention, anxiety problems.  Discussed medication changes as noted below.  He reports good benefit from CBT, which he will continue.  Plan as noted  below  Plan Anxiety disorder Continue Elavil 150 mg p.o. Nightly He has 5 more pills of Effexor 37.5 mg p.o. daily left, discussed gradually weaning it off.  For insomnia Continue Elavil 150 mg p.o. nightly OSA on CPAP-reports he is compliant  Attention and concentration problems Add Intuniv 1 mg p.o. Nightly Provided medication education, provided handouts about the medication.  Panic attacks  Continue CBT with Ms. Peacock Elavil 150 mg p.o. Nightly Vistaril 25 mg p.o. as needed  For Tinnitus He has had this since the past 17 years or so. He has had previous workup done in the past.  Discussed with him to monitor it closely and follow-up with his PMD. Unknown if this is due to his current antidepressant or not since he had had it for a very long time.  Provided handouts and information about tinnitus.  More than 50 % of the time was spent for psychoeducation and supportive psychotherapy and care coordination.  This note was generated in part or whole with voice recognition software. Voice recognition is usually quite accurate but there are transcription errors that can and very often do occur. I apologize for any typographical errors that were not detected and corrected.       Ursula Alert, MD 11/04/2017, 1:02 PM

## 2017-11-09 ENCOUNTER — Ambulatory Visit: Payer: BLUE CROSS/BLUE SHIELD | Admitting: Licensed Clinical Social Worker

## 2017-11-11 DIAGNOSIS — M5106 Intervertebral disc disorders with myelopathy, lumbar region: Secondary | ICD-10-CM | POA: Diagnosis not present

## 2017-11-11 DIAGNOSIS — M5387 Other specified dorsopathies, lumbosacral region: Secondary | ICD-10-CM | POA: Diagnosis not present

## 2017-11-11 DIAGNOSIS — M5432 Sciatica, left side: Secondary | ICD-10-CM | POA: Diagnosis not present

## 2017-12-01 ENCOUNTER — Telehealth: Payer: Self-pay

## 2017-12-01 DIAGNOSIS — F411 Generalized anxiety disorder: Secondary | ICD-10-CM

## 2017-12-01 MED ORDER — AMITRIPTYLINE HCL 150 MG PO TABS: 150.0000 mg | ORAL_TABLET | Freq: Every day | ORAL | 0 refills | Status: DC

## 2017-12-01 NOTE — Telephone Encounter (Signed)
Sent Elavil 90 days to pharmacy.

## 2017-12-01 NOTE — Telephone Encounter (Signed)
  Received a fax from pharmacy requesting a 90 day supply instead of 30 day. Pt was last seen on  11-04-17 next appt 12-16-17   Disp Refills Start End   amitriptyline (ELAVIL) 150 MG tablet 30 tablet 1 11/04/2017    Sig - Route: Take 1 tablet (150 mg total) by mouth at bedtime. - Oral   Sent to pharmacy as: amitriptyline (ELAVIL) 150 MG tablet   E-Prescribing Status: Receipt confirmed by pharmacy (11/04/2017 10:51 AM EST)

## 2017-12-16 ENCOUNTER — Ambulatory Visit (INDEPENDENT_AMBULATORY_CARE_PROVIDER_SITE_OTHER): Payer: BLUE CROSS/BLUE SHIELD | Admitting: Psychiatry

## 2017-12-16 ENCOUNTER — Other Ambulatory Visit: Payer: Self-pay

## 2017-12-16 ENCOUNTER — Encounter: Payer: Self-pay | Admitting: Psychiatry

## 2017-12-16 VITALS — BP 115/81 | HR 83 | Temp 98.8°F | Wt 247.2 lb

## 2017-12-16 DIAGNOSIS — F410 Panic disorder [episodic paroxysmal anxiety] without agoraphobia: Secondary | ICD-10-CM

## 2017-12-16 DIAGNOSIS — Z9989 Dependence on other enabling machines and devices: Secondary | ICD-10-CM

## 2017-12-16 DIAGNOSIS — F411 Generalized anxiety disorder: Secondary | ICD-10-CM | POA: Diagnosis not present

## 2017-12-16 DIAGNOSIS — G4733 Obstructive sleep apnea (adult) (pediatric): Secondary | ICD-10-CM

## 2017-12-16 MED ORDER — PROPRANOLOL HCL 10 MG PO TABS: 10.0000 mg | ORAL_TABLET | Freq: Three times a day (TID) | ORAL | 1 refills | Status: DC | PRN

## 2017-12-16 NOTE — Progress Notes (Signed)
[facility] MD OP Progress Note  12/16/2017 4:06 PM Russell Pineda  MRN:  [id number]  Chief Complaint: ' I am light headed.'  Chief Complaint    Follow-up; Medication Refill; Medication Problem     HPI: Russell Pineda is a 52 year old Caucasian male, employed, lives in [place], has a history of anxiety, OSA, impaired hearing, presented to the clinic today for a follow-up visit.  Patient today reports that he has noticed some side effects to the Intuniv which was prescribed last visit.  He reports he has noticed lightheadedness, brain fog, feeling slowed down and so on when he takes that medication.  He reports he has noticed that his anxiety symptoms may be getting worse especially when he is confronted, in social meetings and so on.  He continues to stay compliant on the Elavil.  He reports he does not have any side effects on the same.  He  reports Elavil as beneficial.  He reports his sleep is good on the Elavil.  He continues to struggle with tinnitus.  He has been struggling with this since the past 15 years or so.  He plans to return to his primary medical doctor to discuss this.  He continues to report work is going well, but challenging.  He denies any other concerns today.  Visit Diagnosis:    ICD-10-CM   1. GAD (generalized anxiety disorder) F41.1 propranolol (INDERAL) 10 MG tablet  2. Panic attacks F41.0   3. OSA on CPAP G47.33    Z99.89     Past Psychiatric History: For anxiety and panic symptoms-has been treated with antidepressants in the past- since 2001.  Denies past history of suicide attempts.  Denies inpatient mental health admissions.  History of being on medications like Zoloft , BuSpar ( tinnitus)   Past Medical History:  Past Medical History:  Diagnosis Date  . Acute stress disorder   . Allergic rhinitis   . Esophageal reflux   . Gout 2016  . Hearing loss 2001  . Hypercholesterolemia   . Hyperlipidemia   . Lipoma of abdominal wall   . Lumbago   . Multiple  benign nevi   . OSA (obstructive sleep apnea)     Past Surgical History:  Procedure Laterality Date  . HERNIA REPAIR    . orchidectomy     left  . TONSILLECTOMY      Family Psychiatric History: Paternal uncle -alcoholism, maternal grand father-alcoholism  Family History:  Family History  Problem Relation Age of Onset  . Emphysema Mother   . Heart disease Father   . Heart attack Father   . Cancer Maternal Grandmother        Bone  . Cancer Paternal Grandfather   . Alcohol abuse Paternal Uncle   . Alcohol abuse Maternal Grandfather    Substance abuse history: Denies  Social History: He is married since the past 26 years, has 2 children.  He was born in [place] , [place], raised by both parents, they are both deceased.  He has been working for the same [employer] since the past 20 years or so.  He is currently promoted to a higher post. Social History   Socioeconomic History  . Marital status: Married    Spouse name: None  . Number of children: None  . Years of education: HS Grad  . Highest education level: None  Social Needs  . Financial resource strain: Not hard at all  . Food insecurity - worry: Never true  . Food insecurity -  inability: Never true  . Transportation needs - medical: No  . Transportation needs - non-medical: No  Occupational History  . Occupation: Full-Time    Comment: [employer]  Tobacco Use  . Smoking status: Never Smoker  . Smokeless tobacco: Former Systems developer    Types: Chew  Substance and Sexual Activity  . Alcohol use: Yes    Alcohol/week: 21.0 oz    Types: 21 Cans of beer, 14 Standard drinks or equivalent per week    Comment: occasional  . Drug use: No  . Sexual activity: Yes  Other Topics Concern  . None  Social History Narrative  . None    Allergies:  Allergies  Allergen Reactions  . Niacin     Flushing    Metabolic Disorder Labs: Lab Results  Component Value Date   HGBA1C 5.3 09/30/2017   MPG 105 09/30/2017    No results found for: PROLACTIN Lab Results  Component Value Date   CHOL 250 (H) 09/30/2017   TRIG 208 (H) 09/30/2017   HDL 58 09/30/2017   CHOLHDL 4.3 09/30/2017   LDLCALC 127 (H) 09/26/2016   LDLCALC 91 02/21/2014   Lab Results  Component Value Date   TSH 2.51 09/30/2017    Therapeutic Level Labs: No results found for: LITHIUM No results found for: VALPROATE No components found for:  CBMZ  Current Medications: Current Outpatient Medications  Medication Sig Dispense Refill  . allopurinol (ZYLOPRIM) 100 MG tablet TAKE 1 TABLET (100 MG TOTAL) BY MOUTH 2 (TWO) TIMES DAILY. 180 tablet 3  . amitriptyline (ELAVIL) 150 MG tablet Take 1 tablet (150 mg total) by mouth at bedtime. 90 tablet 0  . fexofenadine (ALLEGRA) 180 MG tablet Take 180 mg by mouth daily.    . fluocinonide cream (LIDEX) 5.00 % Apply 1 application topically as needed. 30 g 1  . fluticasone (FLONASE) 50 MCG/ACT nasal spray PLACE 1 SPRAY INTO BOTH NOSTRILS DAILY. 16 g 5  . indomethacin (INDOCIN) 50 MG capsule TAKE ONE CAPSULE BY MOUTH TWICE A DAY WITH MEAL AS NEEDED FOR GOUT 30 capsule 3  . ranitidine (ZANTAC) 75 MG tablet Take 75 mg by mouth 2 (two) times daily.    . propranolol (INDERAL) 10 MG tablet Take 1 tablet (10 mg total) by mouth 3 (three) times daily as needed. For severe anxiety sx 90 tablet 1   No current facility-administered medications for this visit.      Musculoskeletal: Strength & Muscle Tone: within normal limits Gait & Station: normal Patient leans: N/A  Psychiatric Specialty Exam: Review of Systems  Psychiatric/Behavioral: The patient is nervous/anxious.   All other systems reviewed and are negative.   Blood pressure 115/81, pulse 83, temperature 98.8 F (37.1 C), temperature source Oral, weight 247 lb 3.2 oz (112.1 kg).Body mass index is 34 kg/m.  General Appearance: Casual  Eye Contact:  Fair  Speech:  Clear and Coherent  Volume:  Normal  Mood:  Anxious  Affect:  Congruent   Thought Process:  Goal Directed and Descriptions of Associations: Intact  Orientation:  Full (Time, Place, and Person)  Thought Content: Logical   Suicidal Thoughts:  No  Homicidal Thoughts:  No  Memory:  Immediate;   Fair Recent;   Fair Remote;   Fair  Judgement:  Fair  Insight:  Fair  Psychomotor Activity:  Normal  Concentration:  Concentration: Fair and Attention Span: Fair  Recall:  [employer] of Knowledge: Fair  Language: Fair  Akathisia:  No  Handed:  Right  AIMS (if indicated): NA  Assets:  Communication Skills Desire for Improvement Housing Social Support  ADL's:  Intact  Cognition: WNL  Sleep:  Fair   Screenings: PHQ2-9     Office Visit from 09/30/2017 in [facility] Visit from 09/26/2016 in [facility]  PHQ-2 Total Score  0  0       Assessment and Plan: Russell Pineda is a 52 year old Caucasian male who is married, employed, has a history of anxiety symptoms, presented to the clinic today for a follow-up visit.  He continues to report some attention, anxiety problems.  He did not tolerate the Intuniv prescribed last visit.  Discussed medication changes,  plan as noted below.  Plan Anxiety disorder Continue Elavil 150 mg p.o. nightly Discontinue Intuniv for side effects. Start propranolol 10 mg p.o. 3 times daily as needed for anxiety symptoms  For insomnia Continue Elavil 150 mg p.o. nightly OSA on CPAP-report he is compliant  Panic attacks Continue CBT with Ms. Peacock Elavil 150 mg p.o. nightly Add propranolol 10 mg p.o. 3 times daily as needed for anxiety symptoms  Tinnitus He has had this since the past 17 years or so.  He reports he had previous workup done in the past.  He plans to follow-up with his primary medical doctor.  Follow-up in 1 month or sooner if needed.  More than 50 % of the time was spent for psychoeducation and supportive psychotherapy and care coordination.  This note was generated in part or  whole with voice recognition software. Voice recognition is usually quite accurate but there are transcription errors that can and very often do occur. I apologize for any typographical errors that were not detected and corrected.       Ursula Alert, MD 12/16/2017, 4:06 PM

## 2017-12-16 NOTE — Patient Instructions (Signed)
Propranolol tablets  What is this medicine?  PROPRANOLOL (proe PRAN oh lole) is a beta-blocker. Beta-blockers reduce the workload on the heart and help it to beat more regularly. This medicine is used to treat high blood pressure, to control irregular heart rhythms (arrhythmias) and to relieve chest pain caused by angina. It may also be helpful after a heart attack. This medicine is also used to prevent migraine headaches, relieve uncontrollable shaking (tremors), and help certain problems related to the thyroid gland and adrenal gland.  This medicine may be used for other purposes; ask your health care provider or pharmacist if you have questions.  COMMON BRAND NAME(S): Inderal  What should I tell my health care provider before I take this medicine?  They need to know if you have any of these conditions:  -circulation problems or blood vessel disease  -diabetes  -history of heart attack or heart disease, vasospastic angina  -kidney disease  -liver disease  -lung or breathing disease, like asthma or emphysema  -pheochromocytoma  -slow heart rate  -thyroid disease  -an unusual or allergic reaction to propranolol, other beta-blockers, medicines, foods, dyes, or preservatives  -pregnant or trying to get pregnant  -breast-feeding  How should I use this medicine?  Take this medicine by mouth with a glass of water. Follow the directions on the prescription label. Take your doses at regular intervals. Do not take your medicine more often than directed. Do not stop taking except on your the advice of your doctor or health care professional.  Talk to your pediatrician regarding the use of this medicine in children. Special care may be needed.  Overdosage: If you think you have taken too much of this medicine contact a poison control center or emergency room at once.  NOTE: This medicine is only for you. Do not share this medicine with others.  What if I miss a dose?  If you miss a dose, take it as soon as you can. If it is  almost time for your next dose, take only that dose. Do not take double or extra doses.  What may interact with this medicine?  Do not take this medicine with any of the following medications:  -feverfew  -phenothiazines like chlorpromazine, mesoridazine, prochlorperazine, thioridazine  This medicine may also interact with the following medications:  -aluminum hydroxide gel  -antipyrine  -antiviral medicines for HIV or AIDS  -barbiturates like phenobarbital  -certain medicines for blood pressure, heart disease, irregular heart beat  -cimetidine  -ciprofloxacin  -diazepam  -fluconazole  -haloperidol  -isoniazid  -medicines for cholesterol like cholestyramine or colestipol  -medicines for mental depression  -medicines for migraine headache like almotriptan, eletriptan, frovatriptan, naratriptan, rizatriptan, sumatriptan, zolmitriptan  -NSAIDs, medicines for pain and inflammation, like ibuprofen or naproxen  -phenytoin  -rifampin  -teniposide  -theophylline  -thyroid medicines  -tolbutamide  -warfarin  -zileuton  This list may not describe all possible interactions. Give your health care provider a list of all the medicines, herbs, non-prescription drugs, or dietary supplements you use. Also tell them if you smoke, drink alcohol, or use illegal drugs. Some items may interact with your medicine.  What should I watch for while using this medicine?  Visit your doctor or health care professional for regular check ups. Check your blood pressure and pulse rate regularly. Ask your health care professional what your blood pressure and pulse rate should be, and when you should contact them.  You may get drowsy or dizzy. Do not drive, use machinery, or   you have diabetes, check with your doctor or health care professional before you change your diet or the dose of your diabetic medicine. Do not treat yourself for coughs, colds, or pain while you are taking this medicine without asking your doctor or health care professional for advice. Some ingredients may increase your blood pressure. What side effects may I notice from receiving this medicine? Side effects that you should report to your doctor or health care professional as soon as possible: -allergic reactions like skin rash, itching or hives, swelling of the face, lips, or tongue -breathing problems -changes in blood sugar -cold hands or feet -difficulty sleeping, nightmares -dry peeling skin -hallucinations -muscle cramps or weakness -slow heart rate -swelling of the legs and ankles -vomiting Side effects that usually do not require medical attention (report to your doctor or health care professional if they continue or are bothersome): -change in sex drive or performance -diarrhea -dry sore eyes -hair loss -nausea -weak or tired This list may not describe all possible side effects. Call your doctor for medical advice about side effects. You may report side effects to FDA at 1-800-FDA-1088. Where should I keep my medicine? Keep out of the reach of children. Store at room temperature between 15 and 30 degrees C (59 and 86 degrees F). Protect from light. Throw away any unused medicine after the expiration date. NOTE: This sheet is a summary. It may not cover all possible information. If you have questions about this medicine, talk to your doctor, pharmacist, or health care provider.  2018 Elsevier/Gold Standard (2013-07-15 14:51:53)  

## 2017-12-18 ENCOUNTER — Ambulatory Visit (INDEPENDENT_AMBULATORY_CARE_PROVIDER_SITE_OTHER): Payer: BLUE CROSS/BLUE SHIELD | Admitting: Family Medicine

## 2017-12-18 ENCOUNTER — Telehealth: Payer: Self-pay

## 2017-12-18 ENCOUNTER — Encounter: Payer: Self-pay | Admitting: Family Medicine

## 2017-12-18 ENCOUNTER — Ambulatory Visit
Admission: RE | Admit: 2017-12-18 | Discharge: 2017-12-18 | Disposition: A | Discharge status: Home / Self Care | Payer: BLUE CROSS/BLUE SHIELD | Source: Ambulatory Visit | Attending: Family Medicine | Admitting: Family Medicine

## 2017-12-18 VITALS — BP 120/86 | HR 87 | Temp 98.5°F | Resp 16 | Wt 248.0 lb

## 2017-12-18 DIAGNOSIS — M5416 Radiculopathy, lumbar region: Secondary | ICD-10-CM

## 2017-12-18 DIAGNOSIS — I781 Nevus, non-neoplastic: Secondary | ICD-10-CM | POA: Diagnosis not present

## 2017-12-18 DIAGNOSIS — M4727 Other spondylosis with radiculopathy, lumbosacral region: Secondary | ICD-10-CM | POA: Diagnosis not present

## 2017-12-18 DIAGNOSIS — M4726 Other spondylosis with radiculopathy, lumbar region: Secondary | ICD-10-CM | POA: Insufficient documentation

## 2017-12-18 DIAGNOSIS — M160 Bilateral primary osteoarthritis of hip: Secondary | ICD-10-CM | POA: Diagnosis not present

## 2017-12-18 DIAGNOSIS — M545 Low back pain: Secondary | ICD-10-CM | POA: Diagnosis not present

## 2017-12-18 MED ORDER — MELOXICAM 15 MG PO TABS: 15.0000 mg | ORAL_TABLET | Freq: Every day | ORAL | 1 refills | Status: DC

## 2017-12-18 NOTE — Telephone Encounter (Signed)
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 Recommend trial of meloxicam 15mg  once a day, #30, rf x 1 to reduce inflammation around spine which should help pain in legs. Call if not much better in 3-4 weeks.

## 2017-12-18 NOTE — Progress Notes (Signed)
Patient: Russell Pineda Male    DOB: [birth year]   52 y.o.   MRN: [id number] Visit Date: 12/18/2017  Today's Provider: Lelon Huh, MD   Chief Complaint  Patient presents with  . Leg Pain   Subjective:    Leg Pain   Incident onset: a couple of years ago. There was no injury mechanism. Pain location: through out right leg. Quality: sharp pain. The pain has been intermittent since onset. Associated symptoms include numbness (in upper left thigh). Exacerbated by: sitting in the car driving. Treatments tried: movement. The treatment provided moderate relief.  He reports that within the past couple of weeks he has had numbness in the left thigh  The numbness is aggravated by prolonged standing or symptoms. These symptoms started a few months ago. Has had back pain off and on for several years, but symptoms have been worse since putting in new patio in November. Had xrays in 2015 showing degenerative changes with anterior osteophytes.   Also reports area of discoloration on his legs. Areas that he points out reveal extensive spider veins.     Allergies  Allergen Reactions  . Niacin     Flushing     Current Outpatient Medications:  .  allopurinol (ZYLOPRIM) 100 MG tablet, TAKE 1 TABLET (100 MG TOTAL) BY MOUTH 2 (TWO) TIMES DAILY., Disp: 180 tablet, Rfl: 3 .  amitriptyline (ELAVIL) 150 MG tablet, Take 1 tablet (150 mg total) by mouth at bedtime., Disp: 90 tablet, Rfl: 0 .  fexofenadine (ALLEGRA) 180 MG tablet, Take 180 mg by mouth daily., Disp: , Rfl:  .  fluocinonide cream (LIDEX) 9.32 %, Apply 1 application topically as needed., Disp: 30 g, Rfl: 1 .  fluticasone (FLONASE) 50 MCG/ACT nasal spray, PLACE 1 SPRAY INTO BOTH NOSTRILS DAILY., Disp: 16 g, Rfl: 5 .  indomethacin (INDOCIN) 50 MG capsule, TAKE ONE CAPSULE BY MOUTH TWICE A DAY WITH MEAL AS NEEDED FOR GOUT, Disp: 30 capsule, Rfl: 3 .  ranitidine (ZANTAC) 75 MG tablet, Take 75 mg by mouth 2 (two) times daily., Disp: , Rfl:  .   propranolol (INDERAL) 10 MG tablet, Take 1 tablet (10 mg total) by mouth 3 (three) times daily as needed. For severe anxiety sx (Patient not taking: Reported on 12/18/2017), Disp: 90 tablet, Rfl: 1  Review of Systems  Constitutional: Negative for appetite change, chills and fever.  HENT: Positive for tinnitus.   Respiratory: Negative for chest tightness, shortness of breath and wheezing.   Cardiovascular: Negative for chest pain and palpitations.  Gastrointestinal: Negative for abdominal pain, nausea and vomiting.  Musculoskeletal: Positive for myalgias (right leg pain). Negative for joint swelling.  Skin: Positive for color change (left ankle).  Neurological: Positive for numbness (in upper left thigh).    Social History   Tobacco Use  . Smoking status: Never Smoker  . Smokeless tobacco: Former Systems developer    Types: Chew  Substance Use Topics  . Alcohol use: Yes    Alcohol/week: 21.0 oz    Types: 21 Cans of beer, 14 Standard drinks or equivalent per week    Comment: occasional   Objective:   BP 120/86 (BP Location: Left Arm, Patient Position: Sitting, Cuff Size: Large)   Pulse 87   Temp 98.5 F (36.9 C) (Oral)   Resp 16   Wt 248 lb (112.5 kg)   SpO2 96% Comment: room air  BMI 34.11 kg/m  There were no vitals filed for this visit.   Physical  Exam  General appearance: alert, well developed, well nourished, cooperative and in no distress Head: Normocephalic, without obvious abnormality, atraumatic Respiratory: Respirations even and unlabored, normal respiratory rate Extremities: No gross deformities. Tender over lumbar spine. FROM hips and back. No tenderness of legs. No masses.  Skin: Skin color, texture, turgor normal. No rashes seen. Spider veins of legs noted as above.  Psych: Appropriate mood and affect. Neurologic: Mental status: Alert, oriented to person, place, and time, thought content appropriate.     Assessment & Plan:     1. Lumbar back pain with radiculopathy  affecting lower extremity Consider scheduled NSAIDs or course of steroids after reviewing xrays.  - DG Lumbar Spine Complete; Future  2. Spider veins        Lelon Huh, MD  [facility]

## 2017-12-18 NOTE — Telephone Encounter (Signed)
Patient advised as below and verbally agrees with treatment plan. Prescription sent into pharmacy.

## 2017-12-31 ENCOUNTER — Telehealth: Payer: Self-pay

## 2017-12-31 NOTE — Telephone Encounter (Signed)
Patient is no longer on this medication. Thanks

## 2017-12-31 NOTE — Telephone Encounter (Signed)
received a fax requesting a refill on the guanfacine hcl er 1 mg take 1 tablet by mouth at bedtime.  Pt last seen on  09-08-17 next appt  01-15-18.  (i do not see this on patient medicaiton list)

## 2017-12-31 NOTE — Telephone Encounter (Signed)
Pharmacy notified.

## 2018-01-01 ENCOUNTER — Telehealth: Payer: BLUE CROSS/BLUE SHIELD | Admitting: Family

## 2018-01-01 DIAGNOSIS — J029 Acute pharyngitis, unspecified: Secondary | ICD-10-CM

## 2018-01-01 MED ORDER — PREDNISONE 5 MG PO TABS
5.0000 mg | ORAL_TABLET | ORAL | 0 refills | Status: DC
Start: 2018-01-01 — End: 2018-01-25

## 2018-01-01 MED ORDER — BENZONATATE 100 MG PO CAPS: 100.0000 mg | ORAL_CAPSULE | Freq: Three times a day (TID) | ORAL | 0 refills | Status: DC | PRN

## 2018-01-01 NOTE — Progress Notes (Signed)
Thank you for the details you included in the comment boxes. Those details are very helpful in determining the best course of treatment for you and help [place] to provide the best care. The current standard of care is 5-7 days of illness before antibiotics are given. I wanted you to know this as it is the evidence-based guideline the E-Visit program operates by. However, approximately 89% of these infections are viral and they do respond very well to supportive care and measures to decrease inflammation, which we will provide for you below.   We are sorry that you are not feeling well.  Here is how we plan to help!  Based on your presentation I believe you most likely have A cough due to a virus.  This is called viral bronchitis and is best treated by rest, plenty of fluids and control of the cough.  You may use Ibuprofen or Tylenol as directed to help your symptoms.     In addition you may use A non-prescription cough medication called Mucinex DM: take 2 tablets every 12 hours. and A prescription cough medication called Tessalon Perles 100mg . You may take 1-2 capsules every 8 hours as needed for your cough.  Sterapred 5 mg dosepak  From your responses in the eVisit questionnaire you describe inflammation in the upper respiratory tract which is causing a significant cough.  This is commonly called Bronchitis and has four common causes:    Allergies  Viral Infections  Acid Reflux  Bacterial Infection Allergies, viruses and acid reflux are treated by controlling symptoms or eliminating the cause. An example might be a cough caused by taking certain blood pressure medications. You stop the cough by changing the medication. Another example might be a cough caused by acid reflux. Controlling the reflux helps control the cough.  USE OF BRONCHODILATOR ("RESCUE") INHALERS: There is a risk from using your bronchodilator too frequently.  The risk is that over-reliance on a medication which only relaxes the  muscles surrounding the breathing tubes can reduce the effectiveness of medications prescribed to reduce swelling and congestion of the tubes themselves.  Although you feel brief relief from the bronchodilator inhaler, your asthma may actually be worsening with the tubes becoming more swollen and filled with mucus.  This can delay other crucial treatments, such as oral steroid medications. If you need to use a bronchodilator inhaler daily, several times per day, you should discuss this with your provider.  There are probably better treatments that could be used to keep your asthma under control.     HOME CARE . Only take medications as instructed by your medical team. . Complete the entire course of an antibiotic. . Drink plenty of fluids and get plenty of rest. . Avoid close contacts especially the very young and the elderly . Cover your mouth if you cough or cough into your sleeve. . Always remember to wash your hands . A steam or ultrasonic humidifier can help congestion.   GET HELP RIGHT AWAY IF: . You develop worsening fever. . You become short of breath . You cough up blood. . Your symptoms persist after you have completed your treatment plan MAKE SURE YOU   Understand these instructions.  Will watch your condition.  Will get help right away if you are not doing well or get worse.  Your e-visit answers were reviewed by a board certified advanced clinical practitioner to complete your personal care plan.  Depending on the condition, your plan could have included both over  the counter or prescription medications. If there is a problem please reply  once you have received a response from your provider. Your safety is important to [place].  If you have drug allergies check your prescription carefully.    You can use MyChart to ask questions about today's visit, request a non-urgent call back, or ask for a work or school excuse for 24 hours related to this e-Visit. If it has been greater than  24 hours you will need to follow up with your provider, or enter a new e-Visit to address those concerns. You will get an e-mail in the next two days asking about your experience.  I hope that your e-visit has been valuable and will speed your recovery. Thank you for using e-visits.

## 2018-01-12 ENCOUNTER — Other Ambulatory Visit: Payer: Self-pay | Admitting: Family Medicine

## 2018-01-12 MED ORDER — MELOXICAM 15 MG PO TABS: 15.0000 mg | ORAL_TABLET | Freq: Every day | ORAL | 4 refills | Status: DC

## 2018-01-12 NOTE — Telephone Encounter (Signed)
Requesting 90 day supply.

## 2018-01-12 NOTE — Telephone Encounter (Signed)
[facility] pharmacy faxed a refill request for a 90-days supply for the following medication. Thanks CC  meloxicam (MOBIC) 15 MG tablet

## 2018-01-15 ENCOUNTER — Ambulatory Visit: Payer: BLUE CROSS/BLUE SHIELD | Admitting: Psychiatry

## 2018-01-25 ENCOUNTER — Encounter: Payer: Self-pay | Admitting: Family Medicine

## 2018-01-25 ENCOUNTER — Ambulatory Visit (INDEPENDENT_AMBULATORY_CARE_PROVIDER_SITE_OTHER): Payer: BLUE CROSS/BLUE SHIELD | Admitting: Family Medicine

## 2018-01-25 VITALS — BP 124/78 | HR 86 | Temp 98.7°F | Resp 16 | Wt 255.0 lb

## 2018-01-25 DIAGNOSIS — J029 Acute pharyngitis, unspecified: Secondary | ICD-10-CM | POA: Diagnosis not present

## 2018-01-25 MED ORDER — AMOXICILLIN 500 MG PO CAPS: 1000.0000 mg | ORAL_CAPSULE | Freq: Two times a day (BID) | ORAL | 0 refills | Status: AC

## 2018-01-25 MED ORDER — NAPROXEN 500 MG PO TABS: 500.0000 mg | ORAL_TABLET | Freq: Two times a day (BID) | ORAL | 0 refills | Status: AC

## 2018-01-25 NOTE — Progress Notes (Signed)
Patient: Russell Pineda Male    DOB: Jul 18, 1966   52 y.o.   MRN: [id number] Visit Date: 01/25/2018  Today's Provider: Lelon Huh, MD   Chief Complaint  Patient presents with  . Sore Throat    10 days ago   Subjective:    Sore Throat   This is a new problem. Episode onset: 10 days ago; patient states he choked on a crouton and scratched the left side of his throat. The problem has been unchanged. The pain is worse on the left side. There has been no fever. Associated symptoms include congestion, coughing (in the mornings), a hoarse voice and trouble swallowing. Pertinent negatives include no abdominal pain or vomiting. He has tried gargles (salt water) for the symptoms. The treatment provided no relief.       Allergies  Allergen Reactions  . Niacin     Flushing     Current Outpatient Medications:  .  allopurinol (ZYLOPRIM) 100 MG tablet, TAKE 1 TABLET (100 MG TOTAL) BY MOUTH 2 (TWO) TIMES DAILY., Disp: 180 tablet, Rfl: 3 .  amitriptyline (ELAVIL) 150 MG tablet, Take 1 tablet (150 mg total) by mouth at bedtime., Disp: 90 tablet, Rfl: 0 .  fexofenadine (ALLEGRA) 180 MG tablet, Take 180 mg by mouth daily., Disp: , Rfl:  .  fluocinonide cream (LIDEX) 7.34 %, Apply 1 application topically as needed., Disp: 30 g, Rfl: 1 .  fluticasone (FLONASE) 50 MCG/ACT nasal spray, PLACE 1 SPRAY INTO BOTH NOSTRILS DAILY., Disp: 16 g, Rfl: 5 .  indomethacin (INDOCIN) 50 MG capsule, TAKE ONE CAPSULE BY MOUTH TWICE A DAY WITH MEAL AS NEEDED FOR GOUT, Disp: 30 capsule, Rfl: 3 .  propranolol (INDERAL) 10 MG tablet, Take 1 tablet (10 mg total) by mouth 3 (three) times daily as needed. For severe anxiety sx, Disp: 90 tablet, Rfl: 1 .  ranitidine (ZANTAC) 75 MG tablet, Take 75 mg by mouth 2 (two) times daily., Disp: , Rfl:  .  benzonatate (TESSALON PERLES) 100 MG capsule, Take 1-2 capsules (100-200 mg total) by mouth every 8 (eight) hours as needed for cough. (Patient not taking: Reported on  01/25/2018), Disp: 30 capsule, Rfl: 0  Review of Systems  Constitutional: Negative for appetite change.  HENT: Positive for congestion, hoarse voice, postnasal drip, sneezing, sore throat and trouble swallowing.   Eyes: Positive for itching.  Respiratory: Positive for cough (in the mornings). Negative for chest tightness.   Cardiovascular: Negative for palpitations.  Gastrointestinal: Negative for abdominal pain, nausea and vomiting.    Social History   Tobacco Use  . Smoking status: Never Smoker  . Smokeless tobacco: Former Systems developer    Types: Chew  Substance Use Topics  . Alcohol use: Yes    Alcohol/week: 21.0 oz    Types: 21 Cans of beer, 14 Standard drinks or equivalent per week    Comment: occasional   Objective:   BP 124/78 (BP Location: Left Arm, Patient Position: Sitting, Cuff Size: Large)   Pulse 86   Temp 98.7 F (37.1 C) (Oral)   Resp 16   Wt 255 lb (115.7 kg)   SpO2 95% Comment: room air  BMI 35.07 kg/m  There were no vitals filed for this visit.   Physical Exam  General Appearance:    Alert, cooperative, no distress  HENT:   bilateral TM normal without fluid or infection, neck without nodes, left tonsillar piller erythematous without exudate and sinuses nontender  Eyes:    PERRL,  conjunctiva/corneas clear, EOM's intact       Lungs:     Clear to auscultation bilaterally, respirations unlabored  Heart:    Regular rate and rhythm  Neurologic:   Awake, alert, oriented x 3. No apparent focal neurological           defect.           Assessment & Plan:     1. Pharyngitis, unspecified etiology By history likely has minor soft tissue trauma from hard crouton being lodged in oro pharynx. Try naproxen and cover with amoxicillin. Refer ENT for laryngoscopy if not greatly improved in a week.        Lelon Huh, MD  [facility]

## 2018-03-26 ENCOUNTER — Other Ambulatory Visit: Payer: Self-pay | Admitting: Family Medicine

## 2018-03-26 DIAGNOSIS — F411 Generalized anxiety disorder: Secondary | ICD-10-CM

## 2018-03-26 MED ORDER — AMITRIPTYLINE HCL 150 MG PO TABS: 150.0000 mg | ORAL_TABLET | Freq: Every day | ORAL | 3 refills | Status: DC

## 2018-03-26 NOTE — Telephone Encounter (Signed)
Pt needs a refill on his amitriptyline 150 mg  He uses [facility]  His call back is [phone]  Thanks teri

## 2018-03-26 NOTE — Telephone Encounter (Signed)
Please review. Thanks!  

## 2018-03-26 NOTE — Telephone Encounter (Signed)
pt will be out tomorrow.

## 2018-06-02 ENCOUNTER — Other Ambulatory Visit: Payer: Self-pay | Admitting: Family Medicine

## 2018-08-06 ENCOUNTER — Other Ambulatory Visit: Payer: Self-pay | Admitting: Family Medicine

## 2018-08-06 DIAGNOSIS — M109 Gout, unspecified: Secondary | ICD-10-CM

## 2018-08-06 MED ORDER — INDOMETHACIN 50 MG PO CAPS: ORAL_CAPSULE | ORAL | 3 refills | Status: DC

## 2018-08-06 NOTE — Telephone Encounter (Signed)
Patient needs refill on [facility]omethacin [facility]

## 2018-08-09 DIAGNOSIS — H40003 Preglaucoma, unspecified, bilateral: Secondary | ICD-10-CM | POA: Diagnosis not present

## 2018-09-09 ENCOUNTER — Other Ambulatory Visit: Payer: Self-pay | Admitting: Family Medicine

## 2018-09-09 ENCOUNTER — Ambulatory Visit (INDEPENDENT_AMBULATORY_CARE_PROVIDER_SITE_OTHER): Payer: BLUE CROSS/BLUE SHIELD | Admitting: Family Medicine

## 2018-09-09 ENCOUNTER — Encounter: Payer: Self-pay | Admitting: Family Medicine

## 2018-09-09 VITALS — BP 120/82 | HR 83 | Temp 98.6°F | Resp 16 | Ht 71.5 in | Wt 252.8 lb

## 2018-09-09 DIAGNOSIS — E782 Mixed hyperlipidemia: Secondary | ICD-10-CM

## 2018-09-09 DIAGNOSIS — Z131 Encounter for screening for diabetes mellitus: Secondary | ICD-10-CM | POA: Diagnosis not present

## 2018-09-09 NOTE — Progress Notes (Signed)
  Subjective:     Patient ID: Russell Pineda, male   DOB: 12-14-1965, 52 y.o.   MRN: [id number] Chief Complaint  Patient presents with  . Letter for School/Work    Patient comes in office today for his biometric screening for his employer, patient states that he feels fairly well today recently getting over upper respiratory symptoms over this past week.    HPI States he continues on C-Pap but is no longer seeing psychiatrist/therapist for anxiety disorder. Flu vaccine deferred due to current illness. Does not exercise regularly.  Review of Systems     Objective:   Physical Exam  Constitutional: He appears well-developed and well-nourished. No distress.       Assessment:    1. Hyperlipemia, mixed - Lipid panel  2. Screening for diabetes mellitus - Renal function panel - Hemoglobin A1c    Plan:    Further f/u and biometric form completion pending lab work.

## 2018-09-09 NOTE — Patient Instructions (Signed)
We will call you with the lab results and your biometric form. Switch to Pepcid for heartburn.

## 2018-09-10 ENCOUNTER — Telehealth: Payer: Self-pay

## 2018-09-10 LAB — RENAL FUNCTION PANEL
ALBUMIN: 4.5 g/dL (ref 3.5–5.5)
BUN/Creatinine Ratio: 19 (ref 9–20)
BUN: 15 mg/dL (ref 6–24)
CALCIUM: 9.9 mg/dL (ref 8.7–10.2)
CHLORIDE: 104 mmol/L (ref 96–106)
CO2: 20 mmol/L (ref 20–29)
CREATININE: 0.81 mg/dL (ref 0.76–1.27)
GFR calc Af Amer: 120 mL/min/{1.73_m2} (ref >59)
GFR calc non Af Amer: 104 mL/min/{1.73_m2} (ref >59)
GLUCOSE: 93 mg/dL (ref 65–99)
Phosphorus: 3.2 mg/dL (ref 2.5–4.5)
Potassium: 4.4 mmol/L (ref 3.5–5.2)
Sodium: 143 mmol/L (ref 134–144)

## 2018-09-10 LAB — LIPID PANEL
CHOL/HDL RATIO: 5.2 ratio — AB (ref 0.0–5.0)
Cholesterol, Total: 219 mg/dL — ABNORMAL HIGH (ref 100–199)
HDL: 42 mg/dL (ref >39)
LDL Calculated: 125 mg/dL — ABNORMAL HIGH (ref 0–99)
Triglycerides: 261 mg/dL — ABNORMAL HIGH (ref 0–149)
VLDL Cholesterol Cal: 52 mg/dL — ABNORMAL HIGH (ref 5–40)

## 2018-09-10 LAB — HEMOGLOBIN A1C
Est. average glucose Bld gHb Est-mCnc: 111 mg/dL
HEMOGLOBIN A1C: 5.5 % (ref 4.8–5.6)

## 2018-09-10 NOTE — Telephone Encounter (Signed)
-----   Message from Carmon Ginsberg, [place] sent at 09/10/2018  7:56 AM EDT ----- Labs are ok with mild cholesterol elevations. Your calculated 10 year risk for developing cardiovascular disease is 4.2%. This is below the 7.5% risk when we recommend cholesterol lowering drugs. Your biometric form is up front for pickup.

## 2018-09-10 NOTE — Telephone Encounter (Signed)
Patient advised, he states that he will have his wife stop by to pick up form. KW

## 2018-09-13 ENCOUNTER — Other Ambulatory Visit: Payer: Self-pay

## 2018-09-13 ENCOUNTER — Encounter: Payer: Self-pay | Admitting: Family Medicine

## 2018-09-13 ENCOUNTER — Ambulatory Visit (INDEPENDENT_AMBULATORY_CARE_PROVIDER_SITE_OTHER): Payer: BLUE CROSS/BLUE SHIELD | Admitting: Family Medicine

## 2018-09-13 VITALS — BP 118/72 | HR 111 | Temp 98.8°F | Ht 71.5 in | Wt 255.8 lb

## 2018-09-13 DIAGNOSIS — J0100 Acute maxillary sinusitis, unspecified: Secondary | ICD-10-CM

## 2018-09-13 MED ORDER — AMOXICILLIN-POT CLAVULANATE 875-125 MG PO TABS: 1.0000 | ORAL_TABLET | Freq: Two times a day (BID) | ORAL | 0 refills | Status: DC

## 2018-09-13 NOTE — Progress Notes (Signed)
  Subjective:     Patient ID: Russell Pineda, male   DOB: 11-06-1966, 52 y.o.   MRN: [id number] Chief Complaint  Patient presents with  . URI    started about 08/30/18 cough, congestion, body aches that started 09/12/18, headache today 09/13/18   HPI Patient reports increased sinus pressure, purulent sinus drainage, post nasal drainage and accompanying cough  Review of Systems     Objective:   Physical Exam  Constitutional: He appears well-developed and well-nourished. No distress.  Ears: T.M's intact without inflammation Sinuses: mild maxillary sinus tenderness Throat: tonsils absent Neck: no cervical adenopathy Lungs: clear     Assessment:    1. Acute non-recurrent maxillary sinusitis - amoxicillin-clavulanate (AUGMENTIN) 875-125 MG tablet; Take 1 tablet by mouth 2 (two) times daily.  Dispense: 20 tablet; Refill: 0    Plan:    Discussed use of Mucinex D and Delsym.

## 2018-09-13 NOTE — Patient Instructions (Signed)
Switch to Mucinex D for congestion and Delsym for cough

## 2018-09-14 ENCOUNTER — Telehealth: Payer: Self-pay | Admitting: Family Medicine

## 2018-09-14 ENCOUNTER — Other Ambulatory Visit: Payer: Self-pay | Admitting: Family Medicine

## 2018-09-14 MED ORDER — HYDROCODONE-HOMATROPINE 5-1.5 MG/5ML PO SYRP: 5.0000 mL | ORAL_SOLUTION | Freq: Four times a day (QID) | ORAL | 0 refills | Status: AC | PRN

## 2018-09-14 NOTE — Telephone Encounter (Signed)
Cough syrup sent in

## 2018-09-14 NOTE — Telephone Encounter (Signed)
Please review. KW 

## 2018-09-14 NOTE — Telephone Encounter (Signed)
Pt's wife Russell Pineda stated pt had OV with Mikki Santee yesterday 09/13/18 and was offered an Rx for cough medication but declined. Russell Pineda stated that pt coughed a lot last night and would now like to get an Rx for cough medication sent to [facility] [address] Please advise. Thanks TNP

## 2018-09-16 ENCOUNTER — Ambulatory Visit
Admission: RE | Admit: 2018-09-16 | Discharge: 2018-09-16 | Disposition: A | Discharge status: Home / Self Care | Payer: BLUE CROSS/BLUE SHIELD | Source: Ambulatory Visit | Attending: Family Medicine | Admitting: Family Medicine

## 2018-09-16 ENCOUNTER — Telehealth: Payer: Self-pay | Admitting: Family Medicine

## 2018-09-16 ENCOUNTER — Encounter: Payer: Self-pay | Admitting: Family Medicine

## 2018-09-16 ENCOUNTER — Ambulatory Visit (INDEPENDENT_AMBULATORY_CARE_PROVIDER_SITE_OTHER): Payer: BLUE CROSS/BLUE SHIELD | Admitting: Family Medicine

## 2018-09-16 VITALS — BP 106/72 | HR 101 | Temp 98.4°F | Wt 250.0 lb

## 2018-09-16 DIAGNOSIS — R059 Cough, unspecified: Secondary | ICD-10-CM

## 2018-09-16 DIAGNOSIS — R509 Fever, unspecified: Secondary | ICD-10-CM

## 2018-09-16 DIAGNOSIS — R0609 Other forms of dyspnea: Secondary | ICD-10-CM | POA: Insufficient documentation

## 2018-09-16 DIAGNOSIS — R05 Cough: Secondary | ICD-10-CM

## 2018-09-16 DIAGNOSIS — R0602 Shortness of breath: Secondary | ICD-10-CM | POA: Diagnosis not present

## 2018-09-16 LAB — POCT INFLUENZA A/B

## 2018-09-16 MED ORDER — DOXYCYCLINE MONOHYDRATE 100 MG PO CAPS: 100.0000 mg | ORAL_CAPSULE | Freq: Two times a day (BID) | ORAL | 0 refills | Status: AC

## 2018-09-16 MED ORDER — PREDNISONE 10 MG PO TABS: ORAL_TABLET | ORAL | 0 refills | Status: AC

## 2018-09-16 NOTE — Progress Notes (Signed)
Patient: Russell Pineda Male    DOB: 1966-01-20   52 y.o.   MRN: [id number] Visit Date: 09/16/2018  Today's Provider: Lelon Huh, MD   Chief Complaint  Patient presents with  . URI    started two weeks ago.  . Fever    102.4 last night.   Subjective:    URI   This is a chronic problem. The current episode started 1 to 4 weeks ago. The problem has been gradually worsening. The maximum temperature recorded prior to his arrival was 102 - 102.9 F. Associated symptoms include coughing, headaches, nausea and sinus pain. Pertinent negatives include no abdominal pain, chest pain, congestion, diarrhea, ear pain, joint pain, joint swelling, neck pain, plugged ear sensation, rash, rhinorrhea, sneezing, sore throat, swollen glands, vomiting or wheezing.       Allergies  Allergen Reactions  . Niacin     Flushing     Current Outpatient Medications:  .  allopurinol (ZYLOPRIM) 100 MG tablet, TAKE 1 TABLET (100 MG TOTAL) BY MOUTH 2 (TWO) TIMES DAILY., Disp: 180 tablet, Rfl: 3 .  amitriptyline (ELAVIL) 150 MG tablet, Take 1 tablet (150 mg total) by mouth at bedtime., Disp: 90 tablet, Rfl: 3 .  amoxicillin-clavulanate (AUGMENTIN) 875-125 MG tablet, Take 1 tablet by mouth 2 (two) times daily., Disp: 20 tablet, Rfl: 0 .  esomeprazole (NEXIUM) 20 MG capsule, Take 20 mg by mouth daily at 12 noon., Disp: , Rfl:  .  fexofenadine (ALLEGRA) 180 MG tablet, Take 180 mg by mouth daily., Disp: , Rfl:  .  fluocinonide cream (LIDEX) 0.63 %, Apply 1 application topically as needed., Disp: 30 g, Rfl: 1 .  fluticasone (FLONASE) 50 MCG/ACT nasal spray, PLACE 1 SPRAY INTO BOTH NOSTRILS DAILY., Disp: 16 g, Rfl: 5 .  HYDROcodone-homatropine (HYCODAN) 5-1.5 MG/5ML syrup, Take 5 mLs by mouth every 6 (six) hours as needed for up to 5 days. 5 ml 4-6 hours as needed for cough, Disp: 100 mL, Rfl: 0 .  indomethacin (INDOCIN) 50 MG capsule, TAKE ONE CAPSULE BY MOUTH TWICE A DAY WITH MEAL AS NEEDED FOR GOUT, Disp:  30 capsule, Rfl: 3 .  ranitidine (ZANTAC) 75 MG tablet, Take 75 mg by mouth 2 (two) times daily., Disp: , Rfl:   Review of Systems  Constitutional: Positive for chills, diaphoresis, fatigue and fever. Negative for activity change, appetite change and unexpected weight change.  HENT: Positive for postnasal drip, sinus pressure, sinus pain and tinnitus. Negative for congestion, ear discharge, ear pain, rhinorrhea, sneezing and sore throat.   Eyes: Negative.   Respiratory: Positive for cough, chest tightness and shortness of breath. Negative for apnea, choking, wheezing and stridor.   Cardiovascular: Negative for chest pain.  Gastrointestinal: Positive for nausea. Negative for abdominal distention, abdominal pain, anal bleeding, blood in stool, constipation, diarrhea, rectal pain and vomiting.  Musculoskeletal: Negative for joint pain and neck pain.  Skin: Negative for rash.  Neurological: Positive for light-headedness and headaches. Negative for dizziness.    Social History   Tobacco Use  . Smoking status: Never Smoker  . Smokeless tobacco: Former Systems developer    Types: Chew  Substance Use Topics  . Alcohol use: Yes    Alcohol/week: 35.0 standard drinks    Types: 21 Cans of beer, 14 Standard drinks or equivalent per week    Comment: occasional   Objective:   BP 106/72 (BP Location: Right Arm, Patient Position: Sitting, Cuff Size: Large)   Pulse (!) 101  Temp 98.4 F (36.9 C) (Oral)   Wt 250 lb (113.4 kg)   SpO2 93%   BMI 34.38 kg/m  Vitals:   09/16/18 0904  BP: 106/72  Pulse: (!) 101  Temp: 98.4 F (36.9 C)  TempSrc: Oral  SpO2: 93%  Weight: 250 lb (113.4 kg)     Physical Exam   General Appearance:    Alert, cooperative, no distress  Eyes:    PERRL, conjunctiva/corneas clear, EOM's intact       Lungs:     Clear to auscultation bilaterally, respirations unlabored  Heart:    Regular rate and rhythm  Neurologic:   Awake, alert, oriented x 3. No apparent focal neurological            defect.        Flu A negative Flu B negative    Assessment & Plan:     1. Fever, unspecified fever cause  - POCT Influenza A/B  2. Cough  - DG Chest 2 View; Future - doxycycline (MONODOX) 100 MG capsule; Take 1 capsule (100 mg total) by mouth 2 (two) times daily for 7 days.  Dispense: 14 capsule; Refill: 0 - predniSONE (DELTASONE) 10 MG tablet; 6 tablets for 1 day, then 5 for 1 day, then 4 for 1 day, then 3 for 1 day, then 2 for 1 day then 1 for 1 day.  Dispense: 21 tablet; Refill: 0       Lelon Huh, MD  [facility]

## 2018-09-16 NOTE — Telephone Encounter (Signed)
Pt's wife called wanting to know if we have received the results back form his xray this morning  Call Back # is [phone]  Thanks  Con Memos

## 2018-09-16 NOTE — Telephone Encounter (Signed)
Patients wife was advised. KW 

## 2018-09-16 NOTE — Telephone Encounter (Signed)
Don't see any pneumonia, just bronchitis on chest XR.  Final report is still pending. Need to go ahead and add doxycycline to Augmentin, which covers some "atypical" infections. Have also sent short course of prednisone to his pharmacy.

## 2018-09-16 NOTE — Telephone Encounter (Signed)
Pt's wife called asking if we would call her back with his test results at [phone].  He does not have access to his phone right now.  They have already went to [facility] patient [facility] to have the test.    Thanks  teri

## 2018-11-28 ENCOUNTER — Other Ambulatory Visit: Payer: Self-pay | Admitting: Family Medicine

## 2019-02-09 DIAGNOSIS — M9903 Segmental and somatic dysfunction of lumbar region: Secondary | ICD-10-CM | POA: Diagnosis not present

## 2019-02-09 DIAGNOSIS — M5442 Lumbago with sciatica, left side: Secondary | ICD-10-CM | POA: Diagnosis not present

## 2019-02-09 DIAGNOSIS — M5387 Other specified dorsopathies, lumbosacral region: Secondary | ICD-10-CM | POA: Diagnosis not present

## 2019-03-17 ENCOUNTER — Other Ambulatory Visit: Payer: Self-pay | Admitting: Family Medicine

## 2019-03-17 DIAGNOSIS — F411 Generalized anxiety disorder: Secondary | ICD-10-CM

## 2019-06-01 ENCOUNTER — Other Ambulatory Visit: Payer: Self-pay | Admitting: Family Medicine

## 2019-07-14 ENCOUNTER — Telehealth: Payer: Self-pay | Admitting: Family Medicine

## 2019-07-15 ENCOUNTER — Encounter: Payer: Self-pay | Admitting: Family Medicine

## 2019-07-15 ENCOUNTER — Other Ambulatory Visit: Payer: Self-pay

## 2019-07-15 ENCOUNTER — Ambulatory Visit (INDEPENDENT_AMBULATORY_CARE_PROVIDER_SITE_OTHER): Payer: BC Managed Care – PPO | Admitting: Family Medicine

## 2019-07-15 VITALS — BP 130/82 | HR 81 | Temp 96.9°F | Wt 255.0 lb

## 2019-07-15 DIAGNOSIS — R1013 Epigastric pain: Secondary | ICD-10-CM | POA: Diagnosis not present

## 2019-07-15 DIAGNOSIS — R194 Change in bowel habit: Secondary | ICD-10-CM

## 2019-07-15 DIAGNOSIS — Z1211 Encounter for screening for malignant neoplasm of colon: Secondary | ICD-10-CM

## 2019-07-15 NOTE — Progress Notes (Signed)
Patient: Russell Pineda Male    DOB: 10/06/1966   53 y.o.   MRN: [id number] Visit Date: 07/15/2019  Today's Provider: Lelon Huh, MD   Chief Complaint  Patient presents with  . Gastroesophageal Reflux   Subjective:     Gastroesophageal Reflux He complains of abdominal pain, early satiety, heartburn and nausea. He reports no coughing, no hoarse voice, no sore throat or no wheezing. This is a recurrent problem. The problem has been gradually worsening (Especially in the last few weeks. ). The heartburn wakes him from sleep. The symptoms are aggravated by bending (Empty stomach). Associated symptoms include fatigue. Pertinent negatives include no muscle weakness, orthopnea or weight loss. He has tried a PPI for the symptoms.  Sometimes burning in stomach relieved with food, but sometimes burning in chest gets worse after eating. Has noted changes in bowel habits, but no blood or black tarry stool.      Allergies  Allergen Reactions  . Niacin     Flushing     Current Outpatient Medications:  .  allopurinol (ZYLOPRIM) 100 MG tablet, TAKE 1 TABLET (100 MG TOTAL) BY MOUTH 2 (TWO) TIMES DAILY., Disp: 180 tablet, Rfl: 3 .  esomeprazole (NEXIUM) 20 MG capsule, Take 20 mg by mouth daily at 12 noon., Disp: , Rfl:  .  fexofenadine (ALLEGRA) 180 MG tablet, Take 180 mg by mouth daily., Disp: , Rfl:  .  fluocinonide cream (LIDEX) [id number] %, Apply 1 application topically as needed., Disp: 30 g, Rfl: 1 .  fluticasone (FLONASE) 50 MCG/ACT nasal spray, PLACE 1 SPRAY INTO BOTH NOSTRILS DAILY., Disp: 16 g, Rfl: 3 .  indomethacin (INDOCIN) 50 MG capsule, TAKE ONE CAPSULE BY MOUTH TWICE A DAY WITH MEAL AS NEEDED FOR GOUT, Disp: 30 capsule, Rfl: 3 .  amitriptyline (ELAVIL) 150 MG tablet, TAKE 1 TABLET (150 MG TOTAL) BY MOUTH AT BEDTIME. (Patient not taking: Reported on 07/15/2019), Disp: 90 tablet, Rfl: 3 .  ranitidine (ZANTAC) 75 MG tablet, Take 75 mg by mouth 2 (two) times daily., Disp: , Rfl:   Review of Systems  Constitutional: Positive for appetite change and fatigue. Negative for activity change, chills, diaphoresis, fever, unexpected weight change and weight loss.  HENT: Negative for hoarse voice and sore throat.   Respiratory: Negative for cough and wheezing.   Gastrointestinal: Positive for abdominal distention, abdominal pain, heartburn and nausea. Negative for anal bleeding, blood in stool, constipation, diarrhea, rectal pain and vomiting.  Musculoskeletal: Negative for muscle weakness.  Neurological: Negative for dizziness, light-headedness and headaches.    Social History   Tobacco Use  . Smoking status: Never Smoker  . Smokeless tobacco: Former Systems developer    Types: Chew  Substance Use Topics  . Alcohol use: Yes    Alcohol/week: 35.0 standard drinks    Types: 21 Cans of beer, 14 Standard drinks or equivalent per week    Comment: occasional      Objective:   BP 130/82 (BP Location: Right Arm, Patient Position: Sitting, Cuff Size: Large)   Pulse 81   Temp (!) 96.9 F (36.1 C) (Temporal)   Wt 255 lb (115.7 kg)   BMI 35.07 kg/m  Vitals:   07/15/19 0828  BP: 130/82  Pulse: 81  Temp: (!) 96.9 F (36.1 C)  TempSrc: Temporal  Weight: 255 lb (115.7 kg)     Physical Exam  General Appearance:    Alert, cooperative, no distress  Eyes:    PERRL, conjunctiva/corneas clear, EOM's intact  Lungs:     Clear to auscultation bilaterally, respirations unlabored  Heart:    Regular rate and rhythm  Abdomen:   bowel sounds present and normal in all 4 quadrants, soft, round or nontender. No CVA tenderness       Assessment & Plan    1. Epigastric pain Continue Nexium for now.  - Comprehensive metabolic panel - Lipid panel - H Pylori, IGM, IGG, IGA AB - Amylase - Lipase - Ambulatory referral to Gastroenterology  Consider u/s if labs normal   2. Bowel habit changes  - Comprehensive metabolic panel - Lipid panel - H Pylori, IGM, IGG, IGA AB - Amylase -  Lipase - Ambulatory referral to Gastroenterology  3. Colon cancer screening  - Ambulatory referral to Gastroenterology  The entirety of the information documented in the History of Present Illness, Review of Systems and Physical Exam were personally obtained by me. Portions of this information were initially documented by Ashley Royalty, CMA and reviewed by me for thoroughness and accuracy.      Lelon Huh, MD  [facility]

## 2019-07-15 NOTE — Patient Instructions (Signed)
.   Please review the attached list of medications and notify my office if there are any errors.   . Please bring all of your medications to every appointment so we can make sure that our medication list is the same as yours.   . We will have flu vaccines available after Labor Day. Please go to your pharmacy or call the office in early September to schedule you flu shot.   

## 2019-07-18 ENCOUNTER — Other Ambulatory Visit: Payer: Self-pay | Admitting: Family Medicine

## 2019-07-18 ENCOUNTER — Telehealth: Payer: Self-pay

## 2019-07-18 DIAGNOSIS — R1013 Epigastric pain: Secondary | ICD-10-CM

## 2019-07-18 NOTE — Telephone Encounter (Signed)
-----   Message from Birdie Sons, MD sent at 07/18/2019  1:34 PM EDT ----- Labs normal, need abdominal ultrasound, have placed order.

## 2019-07-18 NOTE — Telephone Encounter (Signed)
Pt advised.   Thanks,   -Laura  

## 2019-07-19 LAB — COMPREHENSIVE METABOLIC PANEL
ALT: 36 IU/L (ref 0–44)
AST: 32 IU/L (ref 0–40)
Albumin/Globulin Ratio: 2 (ref 1.2–2.2)
Albumin: 4.3 g/dL (ref 4.0–5.0)
Alkaline Phosphatase: 40 IU/L (ref 39–117)
BUN/Creatinine Ratio: 16 (ref 9–20)
BUN: 15 mg/dL (ref 6–24)
Bilirubin Total: 0.5 mg/dL (ref 0.0–1.2)
CO2: 20 mmol/L (ref 20–29)
Calcium: 9.5 mg/dL (ref 8.7–10.2)
Chloride: 106 mmol/L (ref 96–106)
Creatinine, Ser: 0.92 mg/dL (ref 0.76–1.27)
GFR calc Af Amer: 112 mL/min/{1.73_m2} (ref >59)
GFR calc non Af Amer: 97 mL/min/{1.73_m2} (ref >59)
Globulin, Total: 2.1 g/dL (ref 1.5–4.5)
Glucose: 108 mg/dL — ABNORMAL HIGH (ref 65–99)
Potassium: 4 mmol/L (ref 3.5–5.2)
Sodium: 142 mmol/L (ref 134–144)
Total Protein: 6.4 g/dL (ref 6.0–8.5)

## 2019-07-19 LAB — H PYLORI, IGM, IGG, IGA AB
H pylori, IgM Abs: <9 units (ref 0.0–8.9)
H. pylori, IgA Abs: <9 units (ref 0.0–8.9)
H. pylori, IgG AbS: 0.1 Index Value (ref 0.00–0.79)

## 2019-07-19 LAB — LIPID PANEL
Chol/HDL Ratio: 4.4 ratio (ref 0.0–5.0)
Cholesterol, Total: 216 mg/dL — ABNORMAL HIGH (ref 100–199)
HDL: 49 mg/dL (ref >39)
LDL Calculated: 126 mg/dL — ABNORMAL HIGH (ref 0–99)
Triglycerides: 205 mg/dL — ABNORMAL HIGH (ref 0–149)
VLDL Cholesterol Cal: 41 mg/dL — ABNORMAL HIGH (ref 5–40)

## 2019-07-19 LAB — LIPASE: Lipase: 42 U/L (ref 13–78)

## 2019-07-19 LAB — AMYLASE: Amylase: 57 U/L (ref 31–110)

## 2019-07-20 ENCOUNTER — Ambulatory Visit (INDEPENDENT_AMBULATORY_CARE_PROVIDER_SITE_OTHER): Payer: BC Managed Care – PPO | Admitting: Gastroenterology

## 2019-07-20 ENCOUNTER — Encounter: Payer: Self-pay | Admitting: Gastroenterology

## 2019-07-20 ENCOUNTER — Other Ambulatory Visit: Payer: Self-pay

## 2019-07-20 ENCOUNTER — Encounter

## 2019-07-20 VITALS — BP 149/92 | HR 65 | Temp 97.5°F | Ht 71.5 in | Wt 255.4 lb

## 2019-07-20 DIAGNOSIS — K219 Gastro-esophageal reflux disease without esophagitis: Secondary | ICD-10-CM | POA: Diagnosis not present

## 2019-07-20 DIAGNOSIS — Z1211 Encounter for screening for malignant neoplasm of colon: Secondary | ICD-10-CM | POA: Diagnosis not present

## 2019-07-20 MED ORDER — NA SULFATE-K SULFATE-MG SULF 17.5-3.13-1.6 GM/177ML PO SOLN: 354.0000 mL | Freq: Once | ORAL | 0 refills | Status: AC

## 2019-07-20 MED ORDER — BISACODYL EC 5 MG PO TBEC: 10.0000 mg | DELAYED_RELEASE_TABLET | Freq: Once | ORAL | 0 refills | Status: AC

## 2019-07-20 NOTE — Patient Instructions (Signed)
Gastroesophageal Reflux Disease, Adult Gastroesophageal reflux (GER) happens when acid from the stomach flows up into the tube that connects the mouth and the stomach (esophagus). Normally, food travels down the esophagus and stays in the stomach to be digested. With GER, food and stomach acid sometimes move back up into the esophagus. You may have a disease called gastroesophageal reflux disease (GERD) if the reflux:  Happens often.  Causes frequent or very bad symptoms.  Causes problems such as damage to the esophagus. When this happens, the esophagus becomes sore and swollen (inflamed). Over time, GERD can make small holes (ulcers) in the lining of the esophagus. What are the causes? This condition is caused by a problem with the muscle between the esophagus and the stomach. When this muscle is weak or not normal, it does not close properly to keep food and acid from coming back up from the stomach. The muscle can be weak because of:  Tobacco use.  Pregnancy.  Having a certain type of hernia (hiatal hernia).  Alcohol use.  Certain foods and drinks, such as coffee, chocolate, onions, and peppermint. What increases the risk? You are more likely to develop this condition if you:  Are overweight.  Have a disease that affects your connective tissue.  Use NSAID medicines. What are the signs or symptoms? Symptoms of this condition include:  Heartburn.  Difficult or painful swallowing.  The feeling of having a lump in the throat.  A bitter taste in the mouth.  Bad breath.  Having a lot of saliva.  Having an upset or bloated stomach.  Belching.  Chest pain. Different conditions can cause chest pain. Make sure you see your doctor if you have chest pain.  Shortness of breath or noisy breathing (wheezing).  Ongoing (chronic) cough or a cough at night.  Wearing away of the surface of teeth (tooth enamel).  Weight loss. How is this treated? Treatment will depend on how  bad your symptoms are. Your doctor may suggest:  Changes to your diet.  Medicine.  Surgery. Follow these instructions at home: Eating and drinking   Follow a diet as told by your doctor. You may need to avoid foods and drinks such as: ? Coffee and tea (with or without caffeine). ? Drinks that contain alcohol. ? Energy drinks and sports drinks. ? Bubbly (carbonated) drinks or sodas. ? Chocolate and cocoa. ? Peppermint and mint flavorings. ? Garlic and onions. ? Horseradish. ? Spicy and acidic foods. These include peppers, chili powder, curry powder, vinegar, hot sauces, and BBQ sauce. ? Citrus fruit juices and citrus fruits, such as oranges, lemons, and limes. ? Tomato-based foods. These include red sauce, chili, salsa, and pizza with red sauce. ? Fried and fatty foods. These include donuts, french fries, potato chips, and high-fat dressings. ? High-fat meats. These include hot dogs, rib eye steak, sausage, ham, and bacon. ? High-fat dairy items, such as whole milk, butter, and cream cheese.  Eat small meals often. Avoid eating large meals.  Avoid drinking large amounts of liquid with your meals.  Avoid eating meals during the 2-3 hours before bedtime.  Avoid lying down right after you eat.  Do not exercise right after you eat. Lifestyle   Do not use any products that contain nicotine or tobacco. These include cigarettes, e-cigarettes, and chewing tobacco. If you need help quitting, ask your doctor.  Try to lower your stress. If you need help doing this, ask your doctor.  If you are overweight, lose an amount   of weight that is healthy for you. Ask your doctor about a safe weight loss goal. General instructions  Pay attention to any changes in your symptoms.  Take over-the-counter and prescription medicines only as told by your doctor. Do not take aspirin, ibuprofen, or other NSAIDs unless your doctor says it is okay.  Wear loose clothes. Do not wear anything tight  around your waist.  Raise (elevate) the head of your bed about 6 inches (15 cm).  Avoid bending over if this makes your symptoms worse.  Keep all follow-up visits as told by your doctor. This is important. Contact a doctor if:  You have new symptoms.  You lose weight and you do not know why.  You have trouble swallowing or it hurts to swallow.  You have wheezing or a cough that keeps happening.  Your symptoms do not get better with treatment.  You have a hoarse voice. Get help right away if:  You have pain in your arms, neck, jaw, teeth, or back.  You feel sweaty, dizzy, or light-headed.  You have chest pain or shortness of breath.  You throw up (vomit) and your throw-up looks like blood or coffee grounds.  You pass out (faint).  Your poop (stool) is bloody or black.  You cannot swallow, drink, or eat. Summary  If a person has gastroesophageal reflux disease (GERD), food and stomach acid move back up into the esophagus and cause symptoms or problems such as damage to the esophagus.  Treatment will depend on how bad your symptoms are.  Follow a diet as told by your doctor.  Take all medicines only as told by your doctor. This information is not intended to replace advice given to you by your health care provider. Make sure you discuss any questions you have with your health care provider. Document Released: 04/28/2008 Document Revised: 05/19/2018 Document Reviewed: 05/19/2018 Elsevier Patient Education  [place]. High-Fiber Diet Fiber, also called dietary fiber, is a type of carbohydrate that is found in fruits, vegetables, whole grains, and beans. A high-fiber diet can have many health benefits. Your health care provider may recommend a high-fiber diet to help:  Prevent constipation. Fiber can make your bowel movements more regular.  Lower your cholesterol.  Relieve the following conditions: ? Swelling of veins in the anus (hemorrhoids). ? Swelling  and irritation (inflammation) of specific areas of the digestive tract (uncomplicated diverticulosis). ? A problem of the large intestine (colon) that sometimes causes pain and diarrhea (irritable bowel syndrome, IBS).  Prevent overeating as part of a weight-loss plan.  Prevent heart disease, type 2 diabetes, and certain cancers. What is my plan? The recommended daily fiber intake in grams (g) includes:  38 g for men age 70 or younger.  30 g for men over age 64.  69 g for women age 51 or younger.  21 g for women over age 37. You can get the recommended daily intake of dietary fiber by:  Eating a variety of fruits, vegetables, grains, and beans.  Taking a fiber supplement, if it is not possible to get enough fiber through your diet. What do I need to know about a high-fiber diet?  It is better to get fiber through food sources rather than from fiber supplements. There is not a lot of research about how effective supplements are.  Always check the fiber content on the nutrition facts label of any prepackaged food. Look for foods that contain 5 g of fiber or more per serving.  Talk with a diet and nutrition specialist (dietitian) if you have questions about specific foods that are recommended or not recommended for your medical condition, especially if those foods are not listed below.  Gradually increase how much fiber you consume. If you increase your intake of dietary fiber too quickly, you may have bloating, cramping, or gas.  Drink plenty of water. Water helps you to digest fiber. What are tips for following this plan?  Eat a wide variety of high-fiber foods.  Make sure that half of the grains that you eat each day are whole grains.  Eat breads and cereals that are made with whole-grain flour instead of refined flour or white flour.  Eat brown rice, bulgur wheat, or millet instead of white rice.  Start the day with a breakfast that is high in fiber, such as a cereal that  contains 5 g of fiber or more per serving.  Use beans in place of meat in soups, salads, and pasta dishes.  Eat high-fiber snacks, such as berries, raw vegetables, nuts, and popcorn.  Choose whole fruits and vegetables instead of processed forms like juice or sauce. What foods can I eat?  Fruits Berries. Pears. Apples. Oranges. Avocado. Prunes and raisins. Dried figs. Vegetables Sweet potatoes. Spinach. Kale. Artichokes. Cabbage. Broccoli. Cauliflower. Green peas. Carrots. Squash. Grains Whole-grain breads. Multigrain cereal. Oats and oatmeal. Brown rice. Barley. Bulgur wheat. [place]. Quinoa. Bran muffins. Popcorn. Rye wafer crackers. Meats and other proteins [place], kidney, and pinto beans. Soybeans. Split peas. Lentils. Nuts and seeds. Dairy Fiber-fortified yogurt. Beverages Fiber-fortified soy milk. Fiber-fortified orange juice. Other foods Fiber bars. The items listed above may not be a complete list of recommended foods and beverages. Contact a dietitian for more options. What foods are not recommended? Fruits Fruit juice. Cooked, strained fruit. Vegetables Fried potatoes. Canned vegetables. Well-cooked vegetables. Grains White bread. Pasta made with refined flour. White rice. Meats and other proteins Fatty cuts of meat. Fried chicken or fried fish. Dairy Milk. Yogurt. Cream cheese. Sour cream. Fats and oils Butters. Beverages Soft drinks. Other foods Cakes and pastries. The items listed above may not be a complete list of foods and beverages to avoid. Contact a dietitian for more information. Summary  Fiber is a type of carbohydrate. It is found in fruits, vegetables, whole grains, and beans.  There are many health benefits of eating a high-fiber diet, such as preventing constipation, lowering blood cholesterol, helping with weight loss, and reducing your risk of heart disease, diabetes, and certain cancers.  Gradually increase your intake of fiber. Increasing too  fast can result in cramping, bloating, and gas. Drink plenty of water while you increase your fiber.  The best sources of fiber include whole fruits and vegetables, whole grains, nuts, seeds, and beans. This information is not intended to replace advice given to you by your health care provider. Make sure you discuss any questions you have with your health care provider. Document Released: 11/10/2005 Document Revised: 09/14/2017 Document Reviewed: 09/14/2017 Elsevier Patient Education  2020 [employer].

## 2019-07-22 ENCOUNTER — Telehealth: Payer: Self-pay | Admitting: Family Medicine

## 2019-07-22 ENCOUNTER — Other Ambulatory Visit: Payer: Self-pay

## 2019-07-22 ENCOUNTER — Other Ambulatory Visit
Admission: RE | Admit: 2019-07-22 | Discharge: 2019-07-22 | Disposition: A | Discharge status: Home / Self Care | Payer: BC Managed Care – PPO | Source: Ambulatory Visit | Attending: Gastroenterology | Admitting: Gastroenterology

## 2019-07-22 DIAGNOSIS — Z20828 Contact with and (suspected) exposure to other viral communicable diseases: Secondary | ICD-10-CM | POA: Insufficient documentation

## 2019-07-22 DIAGNOSIS — Z01812 Encounter for preprocedural laboratory examination: Secondary | ICD-10-CM | POA: Diagnosis not present

## 2019-07-22 LAB — SARS CORONAVIRUS 2 (TAT 6-24 HRS): SARS Coronavirus 2: NEGATIVE

## 2019-07-22 NOTE — Telephone Encounter (Signed)
Depends whether they find an explanation for his pain on the EGD. I would wait until after the EGD is done, then decide.

## 2019-07-22 NOTE — Telephone Encounter (Signed)
Pt advised.   Thanks,   -Laura  

## 2019-07-22 NOTE — Telephone Encounter (Signed)
Pt states that  He was seen at [facility] and he is going to have a colonoscopy/upper GI. He wants to know if he still needs to have abdominal ultrasound

## 2019-07-22 NOTE — Progress Notes (Signed)
[name] [address]  [place]  [place],  [address]  Main: [phone]  Fax: [phone]   Gastroenterology Consultation  Referring Provider:     Birdie Sons, MD Primary Care Physician:  Birdie Sons, MD Reason for Consultation:    Abdominal pain        HPI:    Chief Complaint  Patient presents with  . Abdominal Pain    Patient states he has had abominal pain that is all over his stomach for 3 weeks. Patient states the pain is a burning, pressure and bloating pain. Has had softer bowel movements, nausea and sweating     Russell Pineda is a 53 y.o. y/o male referred for consultation & management  by Dr. Birdie Sons, MD.  Patient reports abdominal pain, diffusely, burning, pressure-like sensation, with bloating, nonradiating, 5/10, not related to meals.  Has some nausea but no emesis.  Taking Nexium daily which has helped but she continues to have pain.  H pylori testing done by primary care provider was negative.  No weight loss.  No family history of colon cancer.  No prior EGD or colonoscopy.  Past Medical History:  Diagnosis Date  . Acute stress disorder   . Allergic rhinitis   . Esophageal reflux   . Gout 2016  . Hearing loss 2001  . Hypercholesterolemia   . Hyperlipidemia   . Lipoma of abdominal wall   . Lumbago   . Multiple benign nevi   . OSA (obstructive sleep apnea)     Past Surgical History:  Procedure Laterality Date  . HERNIA REPAIR    . orchidectomy     left  . TONSILLECTOMY      Prior to Admission medications   Medication Sig Start Date End Date Taking? Authorizing Provider  allopurinol (ZYLOPRIM) 100 MG tablet TAKE 1 TABLET (100 MG TOTAL) BY MOUTH 2 (TWO) TIMES DAILY. 06/01/19  Yes Birdie Sons, MD  esomeprazole (NEXIUM) 20 MG capsule Take 20 mg by mouth daily at 12 noon.   Yes [provider]  fexofenadine (ALLEGRA) 180 MG tablet Take 180 mg by mouth daily.   Yes [provider]   fluocinonide cream (LIDEX) [id number] % Apply 1 application topically as needed. 05/28/16  Yes Birdie Sons, MD  fluticasone (FLONASE) 50 MCG/ACT nasal spray PLACE 1 SPRAY INTO BOTH NOSTRILS DAILY. 11/28/18  Yes Birdie Sons, MD  indomethacin (INDOCIN) 50 MG capsule TAKE ONE CAPSULE BY MOUTH TWICE A DAY WITH MEAL AS NEEDED FOR GOUT 08/06/18  Yes Birdie Sons, MD  amitriptyline (ELAVIL) 150 MG tablet TAKE 1 TABLET (150 MG TOTAL) BY MOUTH AT BEDTIME. Patient not taking: Reported on 07/15/2019 03/17/19   Birdie Sons, MD  ranitidine (ZANTAC) 75 MG tablet Take 75 mg by mouth 2 (two) times daily.    [provider]    Family History  Problem Relation Age of Onset  . Emphysema Mother   . Heart disease Father   . Heart attack Father   . Cancer Maternal Grandmother        Bone  . Cancer Paternal Grandfather   . Alcohol abuse Paternal Uncle   . Alcohol abuse Maternal Grandfather      Social History   Tobacco Use  . Smoking status: Never Smoker  . Smokeless tobacco: Former Systems developer    Types: Chew  Substance Use Topics  . Alcohol use: Yes    Alcohol/week: 35.0 standard drinks  Types: 21 Cans of beer, 14 Standard drinks or equivalent per week    Comment: occasional  . Drug use: No    Allergies as of 07/20/2019 - Review Complete 07/20/2019  Allergen Reaction Noted  . Niacin  05/08/2015    Review of Systems:    All systems reviewed and negative except where noted in HPI.   Physical Exam:  BP (!) 149/92 (BP Location: Left Arm, Patient Position: Sitting, Cuff Size: Normal)   Pulse 65   Temp (!) 97.5 F (36.4 C) (Temporal)   Ht 5' 11.5" (1.816 m)   Wt 255 lb 6 oz (115.8 kg)   BMI 35.12 kg/m  No LMP for male patient. Psych:  Alert and cooperative. Normal mood and affect. General:   Alert,  Well-developed, well-nourished, pleasant and cooperative in NAD Head:  Normocephalic and atraumatic. Eyes:  Sclera clear, no icterus.   Conjunctiva pink. Ears:  Normal auditory  acuity. Nose:  No deformity, discharge, or lesions. Mouth:  No deformity or lesions,oropharynx pink & moist. Neck:  Supple; no masses or thyromegaly. Abdomen:  Normal bowel sounds.  No bruits.  Soft, non-tender and non-distended without masses, hepatosplenomegaly or hernias noted.  No guarding or rebound tenderness.    Msk:  Symmetrical without gross deformities. Good, equal movement & strength bilaterally. Pulses:  Normal pulses noted. Extremities:  No clubbing or edema.  No cyanosis. Neurologic:  Alert and oriented x3;  grossly normal neurologically. Skin:  Intact without significant lesions or rashes. No jaundice. Lymph Nodes:  No significant cervical adenopathy. Psych:  Alert and cooperative. Normal mood and affect.   Labs: CBC    Component Value Date/Time   WBC 8.7 04/22/2016 1545   WBC 6.7 05/07/2015 1102   RBC 5.18 04/22/2016 1545   RBC 5.41 05/07/2015 1102   HGB 16.3 04/22/2016 1545   HCT 46.3 04/22/2016 1545   PLT 263 04/22/2016 1545   MCV 89 04/22/2016 1545   MCH 31.5 04/22/2016 1545   MCH 30.8 05/07/2015 1102   MCHC 35.2 04/22/2016 1545   MCHC 33.6 05/07/2015 1102   RDW 13.0 04/22/2016 1545   LYMPHSABS 1.8 04/22/2016 1545   EOSABS 0.4 04/22/2016 1545   BASOSABS 0.0 04/22/2016 1545   CMP     Component Value Date/Time   NA 142 07/15/2019 0851   K 4.0 07/15/2019 0851   CL 106 07/15/2019 0851   CO2 20 07/15/2019 0851   GLUCOSE 108 (H) 07/15/2019 0851   GLUCOSE 102 (H) 09/30/2017 1018   BUN 15 07/15/2019 0851   CREATININE 0.92 07/15/2019 0851   CREATININE 0.83 09/30/2017 1018   CALCIUM 9.5 07/15/2019 0851   PROT 6.4 07/15/2019 0851   ALBUMIN 4.3 07/15/2019 0851   AST 32 07/15/2019 0851   ALT 36 07/15/2019 0851   ALKPHOS 40 07/15/2019 0851   BILITOT 0.5 07/15/2019 0851   GFRNONAA 97 07/15/2019 0851   GFRNONAA 103 09/30/2017 1018   GFRAA 112 07/15/2019 0851   GFRAA 120 09/30/2017 1018    Imaging Studies: No results found.  Assessment and Plan:    Russell Pineda is a 53 y.o. y/o male has been referred for abdominal pain  Pain continues despite PPI and H. pylori being negative Patient is also due for screening colonoscopy next He is willing to both EGD for abdominal pain and screening colonoscopy as well  I have discussed alternative options, risks & benefits,  which include, but are not limited to, bleeding, infection, perforation,respiratory complication & drug reaction.  The  patient agrees with this plan & written consent will be obtained.    Avoid NSAIDs  Patient educated extensively on acid reflux lifestyle modification, including buying a bed wedge, not eating 3 hrs before bedtime, diet modifications, and handout given for the same.   High-fiber diet MiraLAX or Metamucil daily with goal of 1-2 soft bowel movements daily.  If not at goal, patient instructed to increase dose to twice daily.  If loose stools with the medication, patient asked to decrease the medication to every other day, or half dose daily.  Patient verbalized understanding     Dr [name]  Speech recognition software was used to dictate the above note.

## 2019-07-26 ENCOUNTER — Encounter: Payer: Self-pay | Admitting: *Deleted

## 2019-07-27 ENCOUNTER — Ambulatory Visit: Payer: BC Managed Care – PPO | Admitting: Anesthesiology

## 2019-07-27 ENCOUNTER — Ambulatory Visit
Admission: RE | Admit: 2019-07-27 | Discharge: 2019-07-27 | Disposition: A | Discharge status: Home / Self Care | Payer: BC Managed Care – PPO | Attending: Gastroenterology | Admitting: Gastroenterology

## 2019-07-27 ENCOUNTER — Encounter
Admission: RE | Disposition: A | Discharge status: Home / Self Care | Payer: Self-pay | Source: Home / Self Care | Attending: Gastroenterology

## 2019-07-27 ENCOUNTER — Encounter: Payer: Self-pay | Admitting: *Deleted

## 2019-07-27 ENCOUNTER — Other Ambulatory Visit: Payer: Self-pay

## 2019-07-27 DIAGNOSIS — K3189 Other diseases of stomach and duodenum: Secondary | ICD-10-CM

## 2019-07-27 DIAGNOSIS — K635 Polyp of colon: Secondary | ICD-10-CM | POA: Diagnosis not present

## 2019-07-27 DIAGNOSIS — K219 Gastro-esophageal reflux disease without esophagitis: Secondary | ICD-10-CM

## 2019-07-27 DIAGNOSIS — Z1211 Encounter for screening for malignant neoplasm of colon: Secondary | ICD-10-CM

## 2019-07-27 DIAGNOSIS — K2950 Unspecified chronic gastritis without bleeding: Secondary | ICD-10-CM | POA: Diagnosis not present

## 2019-07-27 DIAGNOSIS — R1013 Epigastric pain: Secondary | ICD-10-CM | POA: Diagnosis not present

## 2019-07-27 DIAGNOSIS — D125 Benign neoplasm of sigmoid colon: Secondary | ICD-10-CM | POA: Insufficient documentation

## 2019-07-27 DIAGNOSIS — D126 Benign neoplasm of colon, unspecified: Secondary | ICD-10-CM | POA: Diagnosis not present

## 2019-07-27 DIAGNOSIS — R109 Unspecified abdominal pain: Secondary | ICD-10-CM

## 2019-07-27 DIAGNOSIS — K317 Polyp of stomach and duodenum: Secondary | ICD-10-CM

## 2019-07-27 HISTORY — PX: COLONOSCOPY WITH PROPOFOL: SHX5780

## 2019-07-27 HISTORY — PX: ESOPHAGOGASTRODUODENOSCOPY (EGD) WITH PROPOFOL: SHX5813

## 2019-07-27 SURGERY — COLONOSCOPY WITH PROPOFOL
Anesthesia: General

## 2019-07-27 MED ORDER — FENTANYL CITRATE (PF) 100 MCG/2ML IJ SOLN
INTRAMUSCULAR | Status: AC
  Filled 2019-07-27: qty 2

## 2019-07-27 MED ORDER — PROPOFOL 10 MG/ML IV BOLUS
INTRAVENOUS | Status: DC | PRN
  Administered 2019-07-27: 20 mg via INTRAVENOUS
  Administered 2019-07-27: 50 mg via INTRAVENOUS
  Administered 2019-07-27: 10 mg via INTRAVENOUS
  Administered 2019-07-27: 20 mg via INTRAVENOUS

## 2019-07-27 MED ORDER — EPHEDRINE SULFATE 50 MG/ML IJ SOLN
INTRAMUSCULAR | Status: DC | PRN
  Administered 2019-07-27 (×2): 10 mg via INTRAVENOUS

## 2019-07-27 MED ORDER — MIDAZOLAM HCL 2 MG/2ML IJ SOLN
INTRAMUSCULAR | Status: DC | PRN
  Administered 2019-07-27: 1 mg via INTRAVENOUS

## 2019-07-27 MED ORDER — FENTANYL CITRATE (PF) 100 MCG/2ML IJ SOLN
INTRAMUSCULAR | Status: DC | PRN
  Administered 2019-07-27: 50 ug via INTRAVENOUS

## 2019-07-27 MED ORDER — PHENYLEPHRINE HCL (PRESSORS) 10 MG/ML IV SOLN
INTRAVENOUS | Status: DC | PRN
  Administered 2019-07-27: 100 ug via INTRAVENOUS

## 2019-07-27 MED ORDER — PROPOFOL 10 MG/ML IV BOLUS
INTRAVENOUS | Status: AC
  Filled 2019-07-27: qty 20

## 2019-07-27 MED ORDER — EPHEDRINE SULFATE 50 MG/ML IJ SOLN
INTRAMUSCULAR | Status: AC
  Filled 2019-07-27: qty 1

## 2019-07-27 MED ORDER — SODIUM CHLORIDE 0.9 % IV SOLN
INTRAVENOUS | Status: DC
  Administered 2019-07-27: 1000 mL via INTRAVENOUS

## 2019-07-27 MED ORDER — MIDAZOLAM HCL 2 MG/2ML IJ SOLN
INTRAMUSCULAR | Status: AC
  Filled 2019-07-27: qty 2

## 2019-07-27 MED ORDER — PROPOFOL 500 MG/50ML IV EMUL
INTRAVENOUS | Status: DC | PRN
  Administered 2019-07-27: 150 ug/kg/min via INTRAVENOUS

## 2019-07-27 MED ORDER — PROPOFOL 500 MG/50ML IV EMUL
INTRAVENOUS | Status: AC
  Filled 2019-07-27: qty 50

## 2019-07-27 NOTE — H&P (Signed)
Russell Antigua, MD [address] [place], [place], [place], [address] [address], [place], [place], [place], [address] Phone: [phone]  Fax: [phone]  Primary Care Physician:  Birdie Sons, MD   Pre-Procedure History & Physical: HPI:  Russell Pineda is a 53 y.o. male is here for a colonoscopy and EGD.   Past Medical History:  Diagnosis Date  . Acute stress disorder   . Allergic rhinitis   . Esophageal reflux   . Gout 2016  . Hearing loss 2001  . Hypercholesterolemia   . Hyperlipidemia   . Lipoma of abdominal wall   . Lumbago   . Multiple benign nevi   . OSA (obstructive sleep apnea)     Past Surgical History:  Procedure Laterality Date  . HERNIA REPAIR    . orchidectomy     left  . TONSILLECTOMY    . WISDOM TOOTH EXTRACTION      Prior to Admission medications   Medication Sig Start Date End Date Taking? Authorizing Provider  allopurinol (ZYLOPRIM) 100 MG tablet TAKE 1 TABLET (100 MG TOTAL) BY MOUTH 2 (TWO) TIMES DAILY. 06/01/19   Birdie Sons, MD  amitriptyline (ELAVIL) 150 MG tablet TAKE 1 TABLET (150 MG TOTAL) BY MOUTH AT BEDTIME. Patient not taking: Reported on 07/15/2019 03/17/19   Birdie Sons, MD  esomeprazole (NEXIUM) 20 MG capsule Take 20 mg by mouth daily at 12 noon.    [provider]  fexofenadine (ALLEGRA) 180 MG tablet Take 180 mg by mouth daily.    [provider]  fluocinonide cream (LIDEX) [id number] % Apply 1 application topically as needed. 05/28/16   Birdie Sons, MD  fluticasone (FLONASE) 50 MCG/ACT nasal spray PLACE 1 SPRAY INTO BOTH NOSTRILS DAILY. 11/28/18   Birdie Sons, MD  indomethacin (INDOCIN) 50 MG capsule TAKE ONE CAPSULE BY MOUTH TWICE A DAY WITH MEAL AS NEEDED FOR GOUT 08/06/18   Birdie Sons, MD    Allergies as of 07/20/2019 - Review Complete 07/20/2019  Allergen Reaction Noted  . Niacin  05/08/2015    Family History  Problem Relation Age of Onset  . Emphysema Mother   . Heart  disease Father   . Heart attack Father   . Cancer Maternal Grandmother        Bone  . Cancer Paternal Grandfather   . Alcohol abuse Paternal Uncle   . Alcohol abuse Maternal Grandfather     Social History   Socioeconomic History  . Marital status: Married    Spouse name: Not on file  . Number of children: Not on file  . Years of education: HS Grad  . Highest education level: Not on file  Occupational History  . Occupation: Full-Time    Comment: Regulatory affairs officer  Social Needs  . Financial resource strain: Not hard at all  . Food insecurity    Worry: Never true    Inability: Never true  . Transportation needs    Medical: No    Non-medical: No  Tobacco Use  . Smoking status: Never Smoker  . Smokeless tobacco: Former Systems developer    Types: Chew  Substance and Sexual Activity  . Alcohol use: Yes    Alcohol/week: 35.0 standard drinks    Types: 21 Cans of beer, 14 Standard drinks or equivalent per week    Comment: occasional  . Drug use: No  . Sexual activity: Yes  Lifestyle  . Physical activity    Days per week: 0 days  Minutes per session: 0 min  . Stress: To some extent  Relationships  . Social connections    Talks on phone: More than three times a week    Gets together: Once a week    Attends religious service: Never    Active member of club or organization: No    Attends meetings of clubs or organizations: Never    Relationship status: Married  . Intimate partner violence    Fear of current or ex partner: No    Emotionally abused: No    Physically abused: No    Forced sexual activity: No  Other Topics Concern  . Not on file  Social History Narrative  . Not on file    Review of Systems: See HPI, otherwise negative ROS  Physical Exam: BP (!) 125/95   Pulse 77   Temp 97.8 F (36.6 C) (Tympanic)   Resp 18   Ht 5' 11.5" (1.816 m)   Wt 115.7 kg   SpO2 96%   BMI 35.07 kg/m  General:   Alert,  pleasant and cooperative in NAD Head:  Normocephalic and  atraumatic. Neck:  Supple; no masses or thyromegaly. Lungs:  Clear throughout to auscultation, normal respiratory effort.    Heart:  +S1, +S2, Regular rate and rhythm, No edema. Abdomen:  Soft, nontender and nondistended. Normal bowel sounds, without guarding, and without rebound.   Neurologic:  Alert and  oriented x4;  grossly normal neurologically.  Impression/Plan: Russell Pineda is here for a colonoscopy to be performed for average risk screening and EGD for abdominal pain.  Risks, benefits, limitations, and alternatives regarding the procedures have been reviewed with the patient.  Questions have been answered.  All parties agreeable.   Virgel Manifold, MD  07/27/2019, 8:45 AM

## 2019-07-27 NOTE — Op Note (Signed)
[facility] Gastroenterology Patient Name: Russell Pineda Procedure Date: 07/27/2019 8:29 AM MRN: [id number] Account #: [id number] Date of Birth: 1966-08-30 Admit Type: Outpatient Age: 53 Room: [facility] ENDO ROOM 3 Gender: Male Note Status: Finalized Procedure:            Colonoscopy Indications:          Screening for colorectal malignant neoplasm Providers:            Jiovanni Heeter B. Bonna Gains MD, MD Referring MD:         Kirstie Peri. Caryn Section, MD (Referring MD) Medicines:            Monitored Anesthesia Care Complications:        No immediate complications. Procedure:            Pre-Anesthesia Assessment:                       - ASA Grade Assessment: II - A patient with mild                        systemic disease.                       - Prior to the procedure, a History and Physical was                        performed, and patient medications, allergies and                        sensitivities were reviewed. The patient's tolerance of                        previous anesthesia was reviewed.                       - The risks and benefits of the procedure and the                        sedation options and risks were discussed with the                        patient. All questions were answered and informed                        consent was obtained.                       - Patient identification and proposed procedure were                        verified prior to the procedure by the physician, the                        nurse, the anesthesiologist, the anesthetist and the                        technician. The procedure was verified in the procedure                        room.  After obtaining informed consent, the colonoscope was                        passed under direct vision. Throughout the procedure,                        the patient's blood pressure, pulse, and oxygen                        saturations were monitored continuously. The                   Colonoscope was introduced through the anus and                        advanced to the the cecum, identified by appendiceal                        orifice and ileocecal valve. The colonoscopy was                        performed with ease. The patient tolerated the                        procedure well. The quality of the bowel preparation                        was good. Prep was fair but was changed to good with                        cleaning and suctioning. Findings:      The perianal and digital rectal examinations were normal.      Four sessile polyps were found in the sigmoid colon. The polyps were 3       to 5 mm in size. These polyps were removed with a jumbo cold forceps.       Resection and retrieval were complete.      The exam was otherwise without abnormality.      The rectum, sigmoid colon, descending colon, transverse colon, ascending       colon and cecum appeared normal.      The retroflexed view of the distal rectum and anal verge was normal and       showed no anal or rectal abnormalities. Impression:           - Four 3 to 5 mm polyps in the sigmoid colon, removed                        with a jumbo cold forceps. Resected and retrieved.                       - The examination was otherwise normal.                       - The rectum, sigmoid colon, descending colon,                        transverse colon, ascending colon and cecum are normal.                       - The distal rectum and  anal verge are normal on                        retroflexion view. Recommendation:       - Discharge patient to home (with escort).                       - Advance diet as tolerated.                       - Continue present medications.                       - Await pathology results.                       - Repeat colonoscopy in 5 years.                       - The findings and recommendations were discussed with                        the patient.                        - The findings and recommendations were discussed with                        the patient's family.                       - Return to primary care physician as previously                        scheduled. Procedure Code(s):    --- Professional ---                       [phone], Colonoscopy, flexible; with biopsy, single or                        multiple Diagnosis Code(s):    --- Professional ---                       Z12.11, Encounter for screening for malignant neoplasm                        of colon                       K63.5, Polyp of colon CPT copyright 2019 American Medical Association. All rights reserved. The codes documented in this report are preliminary and upon coder review may  be revised to meet current compliance requirements.  [name], MD Margretta Sidle B. Bonna Gains MD, MD 07/27/2019 9:39:27 AM This report has been signed electronically. Number of Addenda: 0 Note Initiated On: 07/27/2019 8:29 AM Scope Withdrawal Time: 0 hours 25 minutes 2 seconds  Total Procedure Duration: 0 hours 29 minutes 49 seconds  Estimated Blood Loss: Estimated blood loss: none.      [place]

## 2019-07-27 NOTE — Op Note (Signed)
[facility] Gastroenterology Patient Name: Russell Pineda Procedure Date: 07/27/2019 8:29 AM MRN: [id number] Account #: [id number] Date of Birth: [birth year] Admit Type: Outpatient Age: 53 Room: [facility] ENDO ROOM 3 Gender: Male Note Status: Finalized Procedure:            Upper GI endoscopy Indications:          Abdominal pain Providers:            Varnita B. Bonna Gains MD, MD Referring MD:         Kirstie Peri. Caryn Section, MD (Referring MD) Medicines:            Monitored Anesthesia Care Complications:        No immediate complications. Procedure:            Pre-Anesthesia Assessment:                       - Prior to the procedure, a History and Physical was                        performed, and patient medications, allergies and                        sensitivities were reviewed. The patient's tolerance of                        previous anesthesia was reviewed.                       - The risks and benefits of the procedure and the                        sedation options and risks were discussed with the                        patient. All questions were answered and informed                        consent was obtained.                       - Patient identification and proposed procedure were                        verified prior to the procedure by the physician, the                        nurse, the anesthesiologist, the anesthetist and the                        technician. The procedure was verified in the procedure                        room.                       - ASA Grade Assessment: II - A patient with mild                        systemic disease.  After obtaining informed consent, the endoscope was                        passed under direct vision. Throughout the procedure,                        the patient's blood pressure, pulse, and oxygen                        saturations were monitored continuously. The Endoscope     was introduced through the mouth, and advanced to the                        third part of duodenum. The upper GI endoscopy was                        accomplished with ease. The patient tolerated the                        procedure well. Findings:      The examined esophagus was normal.      Patchy mildly erythematous mucosa without bleeding was found in the       gastric antrum. Biopsies were taken with a cold forceps for histology.       Biopsies were obtained in the gastric body, at the incisura and in the       gastric antrum with cold forceps for histology.      A few 2 to 4 mm sessile polyps with no bleeding and no stigmata of       recent bleeding were found in the gastric body. Biopsies were taken with       a cold forceps for histology.      The duodenal bulb, second portion of the duodenum and examined duodenum       were normal. Impression:           - Normal esophagus.                       - Erythematous mucosa in the antrum. Biopsied.                       - A few gastric polyps. Biopsied.                       - Normal duodenal bulb, second portion of the duodenum                        and examined duodenum.                       - Biopsies were obtained in the gastric body, at the                        incisura and in the gastric antrum. Recommendation:       - Await pathology results.                       - Discharge patient to home (with escort).                       - Advance diet as tolerated.                       -  Continue present medications.                       - Patient has a contact number available for                        emergencies. The signs and symptoms of potential                        delayed complications were discussed with the patient.                        Return to normal activities tomorrow. Written discharge                        instructions were provided to the patient.                       - Discharge patient to home (with  escort).                       - The findings and recommendations were discussed with                        the patient.                       - The findings and recommendations were discussed with                        the patient's family. Procedure Code(s):    --- Professional ---                       [phone], Esophagogastroduodenoscopy, flexible, transoral;                        with biopsy, single or multiple Diagnosis Code(s):    --- Professional ---                       K31.89, Other diseases of stomach and duodenum                       K31.7, Polyp of stomach and duodenum                       R10.9, Unspecified abdominal pain CPT copyright 2019 American Medical Association. All rights reserved. The codes documented in this report are preliminary and upon coder review may  be revised to meet current compliance requirements.  [name], MD Margretta Sidle B. Bonna Gains MD, MD 07/27/2019 9:03:14 AM This report has been signed electronically. Number of Addenda: 0 Note Initiated On: 07/27/2019 8:29 AM Estimated Blood Loss: Estimated blood loss: none.      [facility]

## 2019-07-27 NOTE — Anesthesia Post-op Follow-up Note (Signed)
Anesthesia QCDR form completed.        

## 2019-07-27 NOTE — Transfer of Care (Signed)
Immediate Anesthesia Transfer of Care Note  Patient: Russell Pineda  Procedure(s) Performed: COLONOSCOPY WITH PROPOFOL (N/A ) ESOPHAGOGASTRODUODENOSCOPY (EGD) WITH PROPOFOL (N/A )  Patient Location: PACU  Anesthesia Type:General  Level of Consciousness: awake and alert   Airway & Oxygen Therapy: Patient Spontanous Breathing and Patient connected to nasal cannula oxygen  Post-op Assessment: Report given to RN and Post -op Vital signs reviewed and stable  Post vital signs: Reviewed and stable  Last Vitals:  Vitals Value Taken Time  BP 110/68 07/27/19 0940  Temp    Pulse 82 07/27/19 0940  Resp 19 07/27/19 0940  SpO2 97 % 07/27/19 0940  Vitals shown include unvalidated device data.  Last Pain:  Vitals:   07/27/19 0747  TempSrc: Tympanic  PainSc: 0-No pain         Complications: No apparent anesthesia complications

## 2019-07-27 NOTE — Anesthesia Preprocedure Evaluation (Addendum)
Anesthesia Evaluation  Patient identified by MRN, date of birth, ID band Patient awake    Reviewed: Allergy & Precautions, H&P , NPO status , Patient's Chart, lab work & pertinent test results  Airway Mallampati: III  TM Distance: >3 FB     Dental  (+) Teeth Intact   Pulmonary sleep apnea ,           Cardiovascular negative cardio ROS       Neuro/Psych PSYCHIATRIC DISORDERS Anxiety negative neurological ROS     GI/Hepatic Neg liver ROS, GERD  Controlled,  Endo/Other  negative endocrine ROS  Renal/GU negative Renal ROS  negative genitourinary   Musculoskeletal   Abdominal   Peds  Hematology negative hematology ROS (+)   Anesthesia Other Findings Obese  Past Medical History: No date: Acute stress disorder No date: Allergic rhinitis No date: Esophageal reflux 2016: Gout 2001: Hearing loss No date: Hypercholesterolemia No date: Hyperlipidemia No date: Lipoma of abdominal wall No date: Lumbago No date: Multiple benign nevi No date: OSA (obstructive sleep apnea)  Past Surgical History: No date: HERNIA REPAIR No date: orchidectomy     Comment:  left No date: TONSILLECTOMY     Reproductive/Obstetrics negative OB ROS                            Anesthesia Physical Anesthesia Plan  ASA: II  Anesthesia Plan: General   Post-op Pain Management:    Induction:   PONV Risk Score and Plan: Propofol infusion and TIVA  Airway Management Planned: Nasal Cannula and Natural Airway  Additional Equipment:   Intra-op Plan:   Post-operative Plan:   Informed Consent: I have reviewed the patients History and Physical, chart, labs and discussed the procedure including the risks, benefits and alternatives for the proposed anesthesia with the patient or authorized representative who has indicated his/her understanding and acceptance.     Dental Advisory Given  Plan Discussed with:  Anesthesiologist and CRNA  Anesthesia Plan Comments:         Anesthesia Quick Evaluation

## 2019-07-27 NOTE — Anesthesia Procedure Notes (Signed)
Date/Time: 07/27/2019 8:50 AM Performed by: Allean Found, CRNA Pre-anesthesia Checklist: Patient identified, Emergency Drugs available, Suction available, Patient being monitored and Timeout performed Patient Re-evaluated:Patient Re-evaluated prior to induction Oxygen Delivery Method: Nasal cannula Placement Confirmation: positive ETCO2

## 2019-07-28 ENCOUNTER — Encounter: Payer: Self-pay | Admitting: Gastroenterology

## 2019-07-28 LAB — SURGICAL PATHOLOGY

## 2019-07-29 NOTE — Anesthesia Postprocedure Evaluation (Signed)
Anesthesia Post Note  Patient: Russell Pineda  Procedure(s) Performed: COLONOSCOPY WITH PROPOFOL (N/A ) ESOPHAGOGASTRODUODENOSCOPY (EGD) WITH PROPOFOL (N/A )  Patient location during evaluation: PACU Anesthesia Type: General Level of consciousness: awake and alert Pain management: pain level controlled Vital Signs Assessment: post-procedure vital signs reviewed and stable Respiratory status: spontaneous breathing, nonlabored ventilation and respiratory function stable Cardiovascular status: blood pressure returned to baseline and stable Postop Assessment: no apparent nausea or vomiting Anesthetic complications: no     Last Vitals:  Vitals:   07/27/19 0959 07/27/19 1009  BP: 115/87 111/78  Pulse: 76 67  Resp: 13 17  Temp:    SpO2: 98% 99%    Last Pain:  Vitals:   07/28/19 0908  TempSrc:   PainSc: 0-No pain                 Durenda Hurt

## 2019-08-02 ENCOUNTER — Ambulatory Visit
Admission: RE | Admit: 2019-08-02 | Discharge: 2019-08-02 | Disposition: A | Discharge status: Home / Self Care | Payer: BC Managed Care – PPO | Source: Ambulatory Visit | Attending: Family Medicine | Admitting: Family Medicine

## 2019-08-02 ENCOUNTER — Other Ambulatory Visit: Payer: Self-pay

## 2019-08-02 ENCOUNTER — Telehealth: Payer: Self-pay

## 2019-08-02 ENCOUNTER — Encounter: Payer: Self-pay | Admitting: Family Medicine

## 2019-08-02 DIAGNOSIS — R1013 Epigastric pain: Secondary | ICD-10-CM | POA: Insufficient documentation

## 2019-08-02 DIAGNOSIS — K760 Fatty (change of) liver, not elsewhere classified: Secondary | ICD-10-CM | POA: Insufficient documentation

## 2019-08-02 DIAGNOSIS — K8020 Calculus of gallbladder without cholecystitis without obstruction: Secondary | ICD-10-CM | POA: Diagnosis not present

## 2019-08-02 NOTE — Telephone Encounter (Signed)
Patient advised.KW 

## 2019-08-02 NOTE — Telephone Encounter (Signed)
-----   Message from Birdie Sons, MD sent at 08/02/2019  3:09 PM EDT ----- Ultrasound shows gallstones and fatty liver. This could be contributing to his abdominal symptoms. Need to avoid fatty foods as much as possible continue Nexium. Follow up in 2-3 months.

## 2019-08-04 ENCOUNTER — Telehealth (INDEPENDENT_AMBULATORY_CARE_PROVIDER_SITE_OTHER): Payer: BC Managed Care – PPO | Admitting: Family Medicine

## 2019-08-04 ENCOUNTER — Encounter: Payer: Self-pay | Admitting: Family Medicine

## 2019-08-04 DIAGNOSIS — K8020 Calculus of gallbladder without cholecystitis without obstruction: Secondary | ICD-10-CM | POA: Diagnosis not present

## 2019-08-04 DIAGNOSIS — R1011 Right upper quadrant pain: Secondary | ICD-10-CM

## 2019-08-04 DIAGNOSIS — R1012 Left upper quadrant pain: Secondary | ICD-10-CM

## 2019-08-04 MED ORDER — TRAMADOL HCL 50 MG PO TABS: 50.0000 mg | ORAL_TABLET | Freq: Three times a day (TID) | ORAL | 0 refills | Status: AC | PRN

## 2019-08-04 NOTE — Progress Notes (Signed)
Patient: Russell Pineda Male    DOB: Jul 30, 1966   53 y.o.   MRN: [id number] Visit Date: 08/04/2019  Today's Provider: Lavon Paganini, MD   Chief Complaint  Patient presents with  . GI Problem   Subjective:    Virtual Visit via Video Note  I connected with Russell Pineda on 08/04/19 at  1:40 PM EDT by a video enabled telemedicine application and verified that I am speaking with the correct person using two identifiers.   Patient location: home Provider location: [place] involved in the visit: patient, provider   I discussed the limitations of evaluation and management by telemedicine and the availability of in person appointments. The patient expressed understanding and agreed to proceed.  GI Problem The primary symptoms include abdominal pain and diarrhea (loose stools). Episode onset: over 1 month.  The abdominal pain is located in the LUQ.  Patient had a upper GI and colonoscopy on 07/27/2019. On 08/02/2019 patient had a [place] and results showed gallstones.   Today he is feeling fine as long as he does not eat.  He was getting indigestion and nausea after eating, as well as bloating.  Last night and this morning felt an intense cramping/pain in RUQ after eating for ~30 minutes.  L flank pain woke him up at 2:30 am.  Radiates into lower back.  Even TTP.  Has eased off some since getting up. Pain comes in waves. Felt as though he could not find a comfortable position. No h/o kidney stones.  No dysuria, hematuria, fever.  Allergies  Allergen Reactions  . Niacin     Flushing     Current Outpatient Medications:  .  allopurinol (ZYLOPRIM) 100 MG tablet, TAKE 1 TABLET (100 MG TOTAL) BY MOUTH 2 (TWO) TIMES DAILY., Disp: 180 tablet, Rfl: 3 .  esomeprazole (NEXIUM) 20 MG capsule, Take 20 mg by mouth daily at 12 noon., Disp: , Rfl:  .  fexofenadine (ALLEGRA) 180 MG tablet, Take 180 mg by mouth daily., Disp: , Rfl:  .  fluocinonide cream (LIDEX) [id number]  %, Apply 1 application topically as needed., Disp: 30 g, Rfl: 1 .  fluticasone (FLONASE) 50 MCG/ACT nasal spray, PLACE 1 SPRAY INTO BOTH NOSTRILS DAILY., Disp: 16 g, Rfl: 3 .  indomethacin (INDOCIN) 50 MG capsule, TAKE ONE CAPSULE BY MOUTH TWICE A DAY WITH MEAL AS NEEDED FOR GOUT, Disp: 30 capsule, Rfl: 3 .  amitriptyline (ELAVIL) 150 MG tablet, TAKE 1 TABLET (150 MG TOTAL) BY MOUTH AT BEDTIME. (Patient not taking: Reported on 07/15/2019), Disp: 90 tablet, Rfl: 3 .  traMADol (ULTRAM) 50 MG tablet, Take 1 tablet (50 mg total) by mouth every 8 (eight) hours as needed for up to 5 days., Disp: 15 tablet, Rfl: 0  Review of Systems  Constitutional: Negative.   Respiratory: Negative.   Cardiovascular: Negative.   Gastrointestinal: Positive for abdominal pain and diarrhea (loose stools).  Musculoskeletal: Negative.     Social History   Tobacco Use  . Smoking status: Never Smoker  . Smokeless tobacco: Former Systems developer    Types: Chew  Substance Use Topics  . Alcohol use: Yes    Alcohol/week: 35.0 standard drinks    Types: 21 Cans of beer, 14 Standard drinks or equivalent per week    Comment: occasional      Objective:   There were no vitals taken for this visit. There were no vitals filed for this visit.There is no height or weight on  file to calculate BMI.   Physical Exam Constitutional:      General: He is not in acute distress.    Appearance: Normal appearance.  HENT:     Head: Normocephalic and atraumatic.  Pulmonary:     Effort: Pulmonary effort is normal. No respiratory distress.  Neurological:     Mental Status: He is alert and oriented to person, place, and time. Mental status is at baseline.  Psychiatric:        Mood and Affect: Mood normal.        Behavior: Behavior normal.      No results found for any visits on 08/04/19.     Assessment & Plan    I discussed the assessment and treatment plan with the patient. The patient was provided an opportunity to ask questions  and all were answered. The patient agreed with the plan and demonstrated an understanding of the instructions.   The patient was advised to call back or seek an in-person evaluation if the symptoms worsen or if the condition fails to improve as anticipated.   1. Left upper quadrant pain/L flank pain - new problem x1 day - no urinary symptoms to suggest pyelonephritis - location and radiation of pain as well as colicy nature, suggests nephrolithiasis - will obtain CT Renal stone study  - discussed pain control and using Tramadol prn - may consider flomax pending CT results - CT RENAL STONE STUDY; Future  2. RUQ pain 3. Calculus of gallbladder without cholecystitis without obstruction - patient with 1 month of GI symptoms including post-prandial nausea and bloating, found to have gallstones on RUQ [place], as well as fatty liver changes - now having RUQ pain after eating, despite working on low fat diet - will refer to Gen Surg to discuss whether he would benefit from surgical intervention - Ambulatory referral to General Surgery    Meds ordered this encounter  Medications  . traMADol (ULTRAM) 50 MG tablet    Sig: Take 1 tablet (50 mg total) by mouth every 8 (eight) hours as needed for up to 5 days.    Dispense:  15 tablet    Refill:  0     Return if symptoms worsen or fail to improve.   The entirety of the information documented in the History of Present Illness, Review of Systems and Physical Exam were personally obtained by me. Portions of this information were initially documented by Tiburcio Pea, CMA and reviewed by me for thoroughness and accuracy.    Veroncia Jezek, Dionne Bucy, MD MPH [facility]

## 2019-08-04 NOTE — Patient Instructions (Signed)

## 2019-08-05 ENCOUNTER — Other Ambulatory Visit: Payer: Self-pay

## 2019-08-05 ENCOUNTER — Ambulatory Visit
Admission: RE | Admit: 2019-08-05 | Discharge: 2019-08-05 | Disposition: A | Discharge status: Home / Self Care | Payer: BC Managed Care – PPO | Source: Ambulatory Visit | Attending: Family Medicine | Admitting: Family Medicine

## 2019-08-05 DIAGNOSIS — R1012 Left upper quadrant pain: Secondary | ICD-10-CM

## 2019-08-05 DIAGNOSIS — K8020 Calculus of gallbladder without cholecystitis without obstruction: Secondary | ICD-10-CM | POA: Diagnosis not present

## 2019-08-08 ENCOUNTER — Telehealth: Payer: Self-pay

## 2019-08-08 NOTE — Telephone Encounter (Signed)
-----   Message from Virgel Manifold, MD sent at 08/08/2019  4:02 PM EDT ----- Your colon polyps were benign but precancerous and were removed. Your repeat colonoscopy should be in 5 years. Biopsies from your stomach did not show an infection. If you still have pain we can change your Nexium. Please call us and let us know if this is the case. Caryl Pina, please set colonoscopy recall

## 2019-08-08 NOTE — Telephone Encounter (Signed)
Patient states he does have gallstones and they are going to removed his gallbladder soon. Patient states he is still having the pain. Patient states do you think the Nexium is causing the stomach infection. What do you want to change him to

## 2019-08-09 NOTE — Telephone Encounter (Signed)
Patient verbalized understanding. He states he will stay on the Nexium at this time. He does want to know if the EGD showed acid reflex.

## 2019-08-09 NOTE — Telephone Encounter (Signed)
Patient verbalized understanding of lab results  

## 2019-08-12 ENCOUNTER — Ambulatory Visit: Payer: BLUE CROSS/BLUE SHIELD | Admitting: Gastroenterology

## 2019-08-16 ENCOUNTER — Ambulatory Visit (INDEPENDENT_AMBULATORY_CARE_PROVIDER_SITE_OTHER): Payer: BC Managed Care – PPO | Admitting: General Surgery

## 2019-08-16 ENCOUNTER — Encounter: Payer: Self-pay | Admitting: General Surgery

## 2019-08-16 ENCOUNTER — Other Ambulatory Visit: Payer: Self-pay

## 2019-08-16 ENCOUNTER — Other Ambulatory Visit: Payer: Self-pay | Admitting: General Surgery

## 2019-08-16 VITALS — BP 119/82 | HR 68 | Temp 97.9°F | Ht 71.5 in | Wt 247.8 lb

## 2019-08-16 DIAGNOSIS — K8020 Calculus of gallbladder without cholecystitis without obstruction: Secondary | ICD-10-CM | POA: Diagnosis not present

## 2019-08-16 NOTE — Progress Notes (Signed)
Patient ID: GLENVILLE ARACE, male   DOB: [birth year], 53 y.o.   MRN: [id number]  Chief Complaint  Patient presents with  . New Patient (Initial Visit)    Gallbladder    HPI Lennox T Mclaury is a 53 y.o. male.   He has been referred by his primary care providers, Dr. Lelon Huh and Dr. Lavon Paganini for surgical evaluation of symptomatic cholelithiasis.  Mr. Stclair gives a history of 1 to 2 months of pain in his right upper quadrant radiating through to his back.  This is accompanied by nausea but no vomiting.  This seems to be brought on by eating anything, but spicy or fatty foods are worse.  He has had a fairly thorough evaluation for abdominal pain, including upper endoscopy, colonoscopy, as well as a kidney stone scan due to pain in his bilateral flanks.  A right upper quadrant ultrasound was also performed as part of this evaluation which revealed gallstones without evidence of cholecystitis or choledocholithiasis.  Laboratory studies have also demonstrated no concerns for biliary obstruction.  He denies ever having jaundice or pancreatitis.  No dark urine or acholic stools.  He does endorse bloating.  He has had some constipation but no diarrhea.  He is interested in surgical removal of his gallbladder.   Past Medical History:  Diagnosis Date  . Acute stress disorder   . Allergic rhinitis   . Esophageal reflux   . Gout 2016  . Hearing loss 2001  . Hypercholesterolemia   . Hyperlipidemia   . Lipoma of abdominal wall   . Lumbago   . Multiple benign nevi   . OSA (obstructive sleep apnea)     Past Surgical History:  Procedure Laterality Date  . COLONOSCOPY WITH PROPOFOL N/A 07/27/2019   Procedure: COLONOSCOPY WITH PROPOFOL;  Surgeon: Virgel Manifold, MD;  Location: [facility]OSCOPY;  Service: Endoscopy;  Laterality: N/A;  . ESOPHAGOGASTRODUODENOSCOPY (EGD) WITH PROPOFOL N/A 07/27/2019   Procedure: ESOPHAGOGASTRODUODENOSCOPY (EGD) WITH PROPOFOL;  Surgeon: Virgel Manifold,  MD;  Location: [facility] ENDOSCOPY;  Service: Endoscopy;  Laterality: N/A;  . HERNIA REPAIR    . orchidectomy     left  . TONSILLECTOMY    . WISDOM TOOTH EXTRACTION      Family History  Problem Relation Age of Onset  . Emphysema Mother   . Heart disease Father   . Heart attack Father   . Cancer Maternal Grandmother        Bone  . Cancer Paternal Grandfather   . Alcohol abuse Paternal Uncle   . Alcohol abuse Maternal Grandfather     Social History Social History   Tobacco Use  . Smoking status: Never Smoker  . Smokeless tobacco: Former Systems developer    Types: Chew  Substance Use Topics  . Alcohol use: Yes    Alcohol/week: 35.0 standard drinks    Types: 21 Cans of beer, 14 Standard drinks or equivalent per week    Comment: occasional  . Drug use: No    Allergies  Allergen Reactions  . Niacin     Flushing    Current Outpatient Medications  Medication Sig Dispense Refill  . allopurinol (ZYLOPRIM) 100 MG tablet TAKE 1 TABLET (100 MG TOTAL) BY MOUTH 2 (TWO) TIMES DAILY. 180 tablet 3  . esomeprazole (NEXIUM) 20 MG capsule Take 20 mg by mouth daily at 12 noon.    . fexofenadine (ALLEGRA) 180 MG tablet Take 180 mg by mouth daily.    . fluocinonide cream (LIDEX) 0.05 %  Apply 1 application topically as needed. 30 g 1  . fluticasone (FLONASE) 50 MCG/ACT nasal spray PLACE 1 SPRAY INTO BOTH NOSTRILS DAILY. 16 g 3  . indomethacin (INDOCIN) 50 MG capsule TAKE ONE CAPSULE BY MOUTH TWICE A DAY WITH MEAL AS NEEDED FOR GOUT 30 capsule 3  . traMADol (ULTRAM) 50 MG tablet Take 50 mg by mouth every 6 (six) hours as needed.      No current facility-administered medications for this visit.     Review of Systems Review of Systems  Gastrointestinal: Positive for abdominal distention, constipation and nausea. Negative for diarrhea and vomiting.  All other systems reviewed and are negative.   Blood pressure 119/82, pulse 68, temperature 97.9 F (36.6 C), height 5' 11.5" (1.816 m), weight 247 lb  12.8 oz (112.4 kg), SpO2 95 %.  Physical Exam Physical Exam Vitals signs reviewed.  Constitutional:      General: He is not in acute distress.    Appearance: He is obese.  HENT:     Head: Normocephalic and atraumatic.     Nose:     Comments: Covered with a mask secondary to COVID-19 precautions    Mouth/Throat:     Comments: Covered with a mask secondary to COVID-19 precautions Eyes:     General: No scleral icterus.       Right eye: No discharge.        Left eye: No discharge.     Conjunctiva/sclera: Conjunctivae normal.  Neck:     Musculoskeletal: Normal range of motion.     Comments: No thyromegaly or dominant thyroid masses appreciated Cardiovascular:     Rate and Rhythm: Normal rate and regular rhythm.     Pulses: Normal pulses.  Pulmonary:     Effort: Pulmonary effort is normal. No respiratory distress.     Breath sounds: Normal breath sounds.  Abdominal:     Palpations: Abdomen is soft.     Tenderness: There is no guarding or rebound.     Comments: Protuberant consistent with his level of obesity.  There is tenderness in the right upper quadrant with deep palpation.  Murphy sign is negative.  Genitourinary:    Comments: Deferred Musculoskeletal:     Right lower leg: No edema.     Left lower leg: No edema.  Lymphadenopathy:     Cervical: No cervical adenopathy.  Skin:    General: Skin is warm and dry.  Neurological:     General: No focal deficit present.     Mental Status: He is alert and oriented to person, place, and time.  Psychiatric:        Mood and Affect: Mood normal.        Behavior: Behavior normal.        Thought Content: Thought content normal.     Data Reviewed I reviewed the recent imaging studies, including a CT renal stone study and complete abdominal ultrasound.  The most consistent finding is that of cholelithiasis without evidence for cholecystitis.  The reports of his upper and lower endoscopies were also reviewed.  He had several polyps  removed from both his stomach and his colon.  There was also evidence of gastritis present.  Labs were also reviewed.  His most recent comprehensive metabolic panel shows no concern for biliary obstruction with normal alkaline phosphatase normal transaminases and normal bilirubin.  Lipase was also normal.  Assessment Pinckney Ferlita is a 53 year old man with right upper quadrant pain associated with nausea and related to ingestion of  fatty foods.  This is very consistent with symptomatic cholelithiasis.  I have recommended that he undergo cholecystectomy.  Plan I discussed the procedure in detail.  We discussed the risks and benefits of a laparoscopic cholecystectomy and possible cholangiogram including, but not limited to: bleeding, infection, injury to surrounding structures such as the intestine or liver, bile leak, retained gallstones, need to convert to an open procedure, prolonged diarrhea, blood clots such as DVT, common bile duct injury, anesthesia risks, and possible need for additional procedures. The patient had the opportunity to ask any questions and these were answered to his satisfaction.  We will schedule him at the soonest mutually convenient date.    Fredirick Maudlin 08/16/2019, 2:56 PM

## 2019-08-16 NOTE — H&P (View-Only) (Signed)
Patient ID: Russell Pineda, male   DOB: 1965/12/24, 53 y.o.   MRN: [id number]  Chief Complaint  Patient presents with  . New Patient (Initial Visit)    Gallbladder    HPI Russell Pineda is a 53 y.o. male.   He has been referred by his primary care providers, Dr. Lelon Huh and Dr. Lavon Paganini for surgical evaluation of symptomatic cholelithiasis.  Mr. Marland gives a history of 1 to 2 months of pain in his right upper quadrant radiating through to his back.  This is accompanied by nausea but no vomiting.  This seems to be brought on by eating anything, but spicy or fatty foods are worse.  He has had a fairly thorough evaluation for abdominal pain, including upper endoscopy, colonoscopy, as well as a kidney stone scan due to pain in his bilateral flanks.  A right upper quadrant ultrasound was also performed as part of this evaluation which revealed gallstones without evidence of cholecystitis or choledocholithiasis.  Laboratory studies have also demonstrated no concerns for biliary obstruction.  He denies ever having jaundice or pancreatitis.  No dark urine or acholic stools.  He does endorse bloating.  He has had some constipation but no diarrhea.  He is interested in surgical removal of his gallbladder.   Past Medical History:  Diagnosis Date  . Acute stress disorder   . Allergic rhinitis   . Esophageal reflux   . Gout 2016  . Hearing loss 2001  . Hypercholesterolemia   . Hyperlipidemia   . Lipoma of abdominal wall   . Lumbago   . Multiple benign nevi   . OSA (obstructive sleep apnea)     Past Surgical History:  Procedure Laterality Date  . COLONOSCOPY WITH PROPOFOL N/A 07/27/2019   Procedure: COLONOSCOPY WITH PROPOFOL;  Surgeon: Virgel Manifold, MD;  Location: [facility];  Service: Endoscopy;  Laterality: N/A;  . ESOPHAGOGASTRODUODENOSCOPY (EGD) WITH PROPOFOL N/A 07/27/2019   Procedure: ESOPHAGOGASTRODUODENOSCOPY (EGD) WITH PROPOFOL;  Surgeon: Virgel Manifold,  MD;  Location: [facility];  Service: Endoscopy;  Laterality: N/A;  . HERNIA REPAIR    . orchidectomy     left  . TONSILLECTOMY    . WISDOM TOOTH EXTRACTION      Family History  Problem Relation Age of Onset  . Emphysema Mother   . Heart disease Father   . Heart attack Father   . Cancer Maternal Grandmother        Bone  . Cancer Paternal Grandfather   . Alcohol abuse Paternal Uncle   . Alcohol abuse Maternal Grandfather     Social History Social History   Tobacco Use  . Smoking status: Never Smoker  . Smokeless tobacco: Former Systems developer    Types: Chew  Substance Use Topics  . Alcohol use: Yes    Alcohol/week: 35.0 standard drinks    Types: 21 Cans of beer, 14 Standard drinks or equivalent per week    Comment: occasional  . Drug use: No    Allergies  Allergen Reactions  . Niacin     Flushing    Current Outpatient Medications  Medication Sig Dispense Refill  . allopurinol (ZYLOPRIM) 100 MG tablet TAKE 1 TABLET (100 MG TOTAL) BY MOUTH 2 (TWO) TIMES DAILY. 180 tablet 3  . esomeprazole (NEXIUM) 20 MG capsule Take 20 mg by mouth daily at 12 noon.    . fexofenadine (ALLEGRA) 180 MG tablet Take 180 mg by mouth daily.    . fluocinonide cream (LIDEX) 0.05 %  Apply 1 application topically as needed. 30 g 1  . fluticasone (FLONASE) 50 MCG/ACT nasal spray PLACE 1 SPRAY INTO BOTH NOSTRILS DAILY. 16 g 3  . indomethacin (INDOCIN) 50 MG capsule TAKE ONE CAPSULE BY MOUTH TWICE A DAY WITH MEAL AS NEEDED FOR GOUT 30 capsule 3  . traMADol (ULTRAM) 50 MG tablet Take 50 mg by mouth every 6 (six) hours as needed.      No current facility-administered medications for this visit.     Review of Systems Review of Systems  Gastrointestinal: Positive for abdominal distention, constipation and nausea. Negative for diarrhea and vomiting.  All other systems reviewed and are negative.   Blood pressure 119/82, pulse 68, temperature 97.9 F (36.6 C), height 5' 11.5" (1.816 m), weight 247 lb  12.8 oz (112.4 kg), SpO2 95 %.  Physical Exam Physical Exam Vitals signs reviewed.  Constitutional:      General: He is not in acute distress.    Appearance: He is obese.  HENT:     Head: Normocephalic and atraumatic.     Nose:     Comments: Covered with a mask secondary to COVID-19 precautions    Mouth/Throat:     Comments: Covered with a mask secondary to COVID-19 precautions Eyes:     General: No scleral icterus.       Right eye: No discharge.        Left eye: No discharge.     Conjunctiva/sclera: Conjunctivae normal.  Neck:     Musculoskeletal: Normal range of motion.     Comments: No thyromegaly or dominant thyroid masses appreciated Cardiovascular:     Rate and Rhythm: Normal rate and regular rhythm.     Pulses: Normal pulses.  Pulmonary:     Effort: Pulmonary effort is normal. No respiratory distress.     Breath sounds: Normal breath sounds.  Abdominal:     Palpations: Abdomen is soft.     Tenderness: There is no guarding or rebound.     Comments: Protuberant consistent with his level of obesity.  There is tenderness in the right upper quadrant with deep palpation.  Murphy sign is negative.  Genitourinary:    Comments: Deferred Musculoskeletal:     Right lower leg: No edema.     Left lower leg: No edema.  Lymphadenopathy:     Cervical: No cervical adenopathy.  Skin:    General: Skin is warm and dry.  Neurological:     General: No focal deficit present.     Mental Status: He is alert and oriented to person, place, and time.  Psychiatric:        Mood and Affect: Mood normal.        Behavior: Behavior normal.        Thought Content: Thought content normal.     Data Reviewed I reviewed the recent imaging studies, including a CT renal stone study and complete abdominal ultrasound.  The most consistent finding is that of cholelithiasis without evidence for cholecystitis.  The reports of his upper and lower endoscopies were also reviewed.  He had several polyps  removed from both his stomach and his colon.  There was also evidence of gastritis present.  Labs were also reviewed.  His most recent comprehensive metabolic panel shows no concern for biliary obstruction with normal alkaline phosphatase normal transaminases and normal bilirubin.  Lipase was also normal.  Assessment Yeico Wittstruck is a 53 year old man with right upper quadrant pain associated with nausea and related to ingestion of  fatty foods.  This is very consistent with symptomatic cholelithiasis.  I have recommended that he undergo cholecystectomy.  Plan I discussed the procedure in detail.  We discussed the risks and benefits of a laparoscopic cholecystectomy and possible cholangiogram including, but not limited to: bleeding, infection, injury to surrounding structures such as the intestine or liver, bile leak, retained gallstones, need to convert to an open procedure, prolonged diarrhea, blood clots such as DVT, common bile duct injury, anesthesia risks, and possible need for additional procedures. The patient had the opportunity to ask any questions and these were answered to his satisfaction.  We will schedule him at the soonest mutually convenient date.    Fredirick Maudlin 08/16/2019, 2:56 PM

## 2019-08-16 NOTE — Patient Instructions (Signed)
You have requested to have your gallbladder removed. This will be done at [facility] with Dr. Celine Ahr.  You will most likely be out of work 1-2 weeks for this surgery. You will return after your post-op appointment with a lifting restriction for approximately 4 more weeks.  You will be able to eat anything you would like to following surgery. But, start by eating a bland diet and advance this as tolerated. The Gallbladder diet is below, please go as closely by this diet as possible prior to surgery to avoid any further attacks.  Please see the (blue)pre-care form that you have been given today. If you have any questions, please call our office.  Laparoscopic Cholecystectomy Laparoscopic cholecystectomy is surgery to remove the gallbladder. The gallbladder is located in the upper right part of the abdomen, behind the liver. It is a storage sac for bile, which is produced in the liver. Bile aids in the digestion and absorption of fats. Cholecystectomy is often done for inflammation of the gallbladder (cholecystitis). This condition is usually caused by a buildup of gallstones (cholelithiasis) in the gallbladder. Gallstones can block the flow of bile, and that can result in inflammation and pain. In severe cases, emergency surgery may be required. If emergency surgery is not required, you will have time to prepare for the procedure. Laparoscopic surgery is an alternative to open surgery. Laparoscopic surgery has a shorter recovery time. Your common bile duct may also need to be examined during the procedure. If stones are found in the common bile duct, they may be removed. LET [facility] CARE PROVIDER KNOW ABOUT:  Any allergies you have.  All medicines you are taking, including vitamins, herbs, eye drops, creams, and over-the-counter medicines.  Previous problems you or members of your family have had with the use of anesthetics.  Any blood disorders you have.  Previous surgeries you have  had.    Any medical conditions you have. RISKS AND COMPLICATIONS Generally, this is a safe procedure. However, problems may occur, including:  Infection.  Bleeding.  Allergic reactions to medicines.  Damage to other structures or organs.  A stone remaining in the common bile duct.  A bile leak from the cyst duct that is clipped when your gallbladder is removed.  The need to convert to open surgery, which requires a larger incision in the abdomen. This may be necessary if your surgeon thinks that it is not safe to continue with a laparoscopic procedure. BEFORE THE PROCEDURE  Ask your health care provider about:  Changing or stopping your regular medicines. This is especially important if you are taking diabetes medicines or blood thinners.  Taking medicines such as aspirin and ibuprofen. These medicines can thin your blood. Do not take these medicines before your procedure if your health care provider instructs you not to.  Follow instructions from your health care provider about eating or drinking restrictions.  Let your health care provider know if you develop a cold or an infection before surgery.  Plan to have someone take you home after the procedure.  Ask your health care provider how your surgical site will be marked or identified.  You may be given antibiotic medicine to help prevent infection. PROCEDURE  To reduce your risk of infection:  Your health care team will wash or sanitize their hands.  Your skin will be washed with soap.  An IV tube may be inserted into one of your veins.  You will be given a medicine to make you fall  asleep (general anesthetic).  A breathing tube will be placed in your mouth.  The surgeon will make several small cuts (incisions) in your abdomen.  A thin, lighted tube (laparoscope) that has a tiny camera on the end will be inserted through one of the small incisions. The camera on the laparoscope will send a picture to a TV  screen (monitor) in the operating room. This will give the surgeon a good view inside your abdomen.  A gas will be pumped into your abdomen. This will expand your abdomen to give the surgeon more room to perform the surgery.  Other tools that are needed for the procedure will be inserted through the other incisions. The gallbladder will be removed through one of the incisions.  After your gallbladder has been removed, the incisions will be closed with stitches (sutures), staples, or skin glue.  Your incisions may be covered with a bandage (dressing). The procedure may vary among health care providers and hospitals. AFTER THE PROCEDURE  Your blood pressure, heart rate, breathing rate, and blood oxygen level will be monitored often until the medicines you were given have worn off.  You will be given medicines as needed to control your pain.   This information is not intended to replace advice given to you by your health care provider. Make sure you discuss any questions you have with your health care provider.   Document Released: 11/10/2005 Document Revised: 08/01/2015 Document Reviewed: 06/22/2013 Elsevier Interactive Patient Education 2016 [place] Diet for Gallbladder Conditions A low-fat diet can be helpful if you have pancreatitis or a gallbladder condition. With these conditions, your pancreas and gallbladder have trouble digesting fats. A healthy eating plan with less fat will help rest your pancreas and gallbladder and reduce your symptoms. WHAT DO I NEED TO KNOW ABOUT THIS DIET?  Eat a low-fat diet.  Reduce your fat intake to less than 20-30% of your total daily calories. This is less than 50-60 g of fat per day.  Remember that you need some fat in your diet. Ask your dietician what your daily goal should be.  Choose nonfat and low-fat healthy foods. Look for the words "nonfat," "low fat," or "fat free."  As a guide, look on the label and choose foods with  less than 3 g of fat per serving. Eat only one serving.  Avoid alcohol.  Do not smoke. If you need help quitting, talk with your health care provider.  Eat small frequent meals instead of three large heavy meals. WHAT FOODS CAN I EAT? Grains Include healthy grains and starches such as potatoes, wheat bread, fiber-rich cereal, and brown rice. Choose whole grain options whenever possible. In adults, whole grains should account for 45-65% of your daily calories.  Fruits and Vegetables Eat plenty of fruits and vegetables. Fresh fruits and vegetables add fiber to your diet. Meats and Other Protein Sources Eat lean meat such as chicken and pork. Trim any fat off of meat before cooking it. Eggs, fish, and beans are other sources of protein. In adults, these foods should account for 10-35% of your daily calories. Dairy Choose low-fat milk and dairy options. Dairy includes fat and protein, as well as calcium.  Fats and Oils Limit high-fat foods such as fried foods, sweets, baked goods, sugary drinks.  Other Creamy sauces and condiments, such as mayonnaise, can add extra fat. Think about whether or not you need to use them, or use smaller amounts or low fat options. WHAT FOODS  ARE NOT RECOMMENDED?  High fat foods, such as:  [employer].  Ice cream.  [place] toast.  Sweet rolls.  Pizza.  Cheese bread.  Foods covered with batter, butter, creamy sauces, or cheese.  Fried foods.  Sugary drinks and desserts.  Foods that cause gas or bloating   This information is not intended to replace advice given to you by your health care provider. Make sure you discuss any questions you have with your health care provider.   Document Released: 11/15/2013 Document Reviewed: 11/15/2013 Elsevier Interactive Patient Education [employer].

## 2019-08-18 ENCOUNTER — Telehealth: Payer: Self-pay | Admitting: General Surgery

## 2019-08-18 NOTE — Telephone Encounter (Signed)
Pt has been advised of pre admission date/time, Covid Testing date and Surgery date.  Surgery Date: 09/12/19 with Dr Wilburn Cornelia cholecystectomy.  Preadmission Testing Date: 09/05/19 between 8-1:00pm-phone interview.  Covid Testing Date: 09/08/19 between 8-10:30am - patient advised to go to the [facility] ([place]  [employer] Video sent via [employer] [facility] and [employer].  Patient has been made aware to call [phone], between 1-3:00pm the day before surgery, to find out what time to arrive.

## 2019-08-22 ENCOUNTER — Encounter: Payer: Self-pay | Admitting: Family Medicine

## 2019-08-22 ENCOUNTER — Other Ambulatory Visit: Payer: Self-pay

## 2019-08-22 ENCOUNTER — Telehealth: Payer: Self-pay | Admitting: Family Medicine

## 2019-08-22 ENCOUNTER — Ambulatory Visit
Admission: RE | Admit: 2019-08-22 | Discharge: 2019-08-22 | Disposition: A | Discharge status: Home / Self Care | Payer: BC Managed Care – PPO | Source: Ambulatory Visit | Attending: Family Medicine | Admitting: Family Medicine

## 2019-08-22 ENCOUNTER — Ambulatory Visit
Admission: RE | Admit: 2019-08-22 | Discharge: 2019-08-22 | Disposition: A | Discharge status: Home / Self Care | Payer: BC Managed Care – PPO | Attending: Family Medicine | Admitting: Family Medicine

## 2019-08-22 ENCOUNTER — Telehealth: Payer: Self-pay

## 2019-08-22 ENCOUNTER — Ambulatory Visit (INDEPENDENT_AMBULATORY_CARE_PROVIDER_SITE_OTHER): Payer: BC Managed Care – PPO | Admitting: Family Medicine

## 2019-08-22 VITALS — BP 108/62 | HR 66 | Temp 97.1°F | Resp 15 | Wt 245.6 lb

## 2019-08-22 DIAGNOSIS — S299XXA Unspecified injury of thorax, initial encounter: Secondary | ICD-10-CM | POA: Diagnosis not present

## 2019-08-22 DIAGNOSIS — M25511 Pain in right shoulder: Secondary | ICD-10-CM

## 2019-08-22 DIAGNOSIS — R0781 Pleurodynia: Secondary | ICD-10-CM | POA: Insufficient documentation

## 2019-08-22 MED ORDER — KETOROLAC TROMETHAMINE 60 MG/2ML IM SOLN
60.0000 mg | Freq: Once | INTRAMUSCULAR | Status: AC
  Administered 2019-08-22: 60 mg via INTRAMUSCULAR

## 2019-08-22 MED ORDER — IBUPROFEN 600 MG PO TABS: 600.0000 mg | ORAL_TABLET | Freq: Four times a day (QID) | ORAL | 0 refills | Status: DC | PRN

## 2019-08-22 MED ORDER — CELECOXIB 100 MG PO CAPS: 100.0000 mg | ORAL_CAPSULE | Freq: Two times a day (BID) | ORAL | 0 refills | Status: AC

## 2019-08-22 NOTE — Telephone Encounter (Signed)
Please advise for pain management since he cannot take Ibuprofen. KW

## 2019-08-22 NOTE — Progress Notes (Signed)
Patient: Russell Pineda Male    DOB: 1966/09/14   53 y.o.   MRN: [id number] Visit Date: 08/22/2019  Today's Provider: Lelon Huh, MD   Chief Complaint  Patient presents with  . Fall   Subjective:     Fall The accident occurred 6 to 12 hours ago. The fall occurred from a ladder. He fell from a height of 3 to 5 ft. He landed on grass. The point of impact was the right shoulder (right side of ribs). The pain is present in the right shoulder (right side). The pain is at a severity of 8/10. The symptoms are aggravated by movement and use of injured limb. Pertinent negatives include no abdominal pain, bowel incontinence, fever, headaches, hearing loss, hematuria, loss of consciousness, nausea, numbness, tingling, visual change or vomiting. He has tried heat (Tramadol) for the symptoms. The treatment provided no relief.    Allergies  Allergen Reactions  . Niacin     Flushing     Current Outpatient Medications:  .  allopurinol (ZYLOPRIM) 100 MG tablet, TAKE 1 TABLET (100 MG TOTAL) BY MOUTH 2 (TWO) TIMES DAILY., Disp: 180 tablet, Rfl: 3 .  esomeprazole (NEXIUM) 20 MG capsule, Take 20 mg by mouth daily at 12 noon., Disp: , Rfl:  .  fexofenadine (ALLEGRA) 180 MG tablet, Take 180 mg by mouth daily., Disp: , Rfl:  .  fluocinonide cream (LIDEX) [id number] %, Apply 1 application topically as needed., Disp: 30 g, Rfl: 1 .  fluticasone (FLONASE) 50 MCG/ACT nasal spray, PLACE 1 SPRAY INTO BOTH NOSTRILS DAILY., Disp: 16 g, Rfl: 3 .  indomethacin (INDOCIN) 50 MG capsule, TAKE ONE CAPSULE BY MOUTH TWICE A DAY WITH MEAL AS NEEDED FOR GOUT, Disp: 30 capsule, Rfl: 3 .  traMADol (ULTRAM) 50 MG tablet, Take 50 mg by mouth every 6 (six) hours as needed. , Disp: , Rfl:   Review of Systems  Constitutional: Negative for fever.  Gastrointestinal: Negative for abdominal pain, bowel incontinence, nausea and vomiting.  Genitourinary: Negative for hematuria.  Neurological: Negative for tingling, loss of  consciousness, numbness and headaches.    Social History   Tobacco Use  . Smoking status: Never Smoker  . Smokeless tobacco: Former Systems developer    Types: Chew  Substance Use Topics  . Alcohol use: Yes    Alcohol/week: 35.0 standard drinks    Types: 21 Cans of beer, 14 Standard drinks or equivalent per week    Comment: occasional      Objective:   BP 108/62   Pulse 66   Temp (!) 97.1 F (36.2 C) (Oral)   Resp 15   Wt 245 lb 9.6 oz (111.4 kg)   SpO2 95%   BMI 33.78 kg/m    Physical Exam  General appearance: Overweight male, cooperative and in no acute distress Head: Normocephalic, without obvious abnormality, atraumatic Respiratory: Respirations even and unlabored, normal respiratory rate Extremities: Diffusely tender around right shoulder, no obviously dislocation. Rotation limited to 30 degrees due to pain.  Point tenderness of right lateral ribs. No swelling or erythema.      Assessment & Plan    1. Acute pain of right shoulder  - ketorolac (TORADOL) injection 60 mg  2. Rib pain on right side  - DG Shoulder Right; Future - DG Ribs Unilateral Right; Future - ketorolac (TORADOL) injection 60 mg  The entirety of the information documented in the History of Present Illness, Review of Systems and Physical Exam were personally obtained  by me. Portions of this information were initially documented by Minette Headland, CMA and reviewed by me for thoroughness and accuracy.      Lelon Huh, MD  [facility]

## 2019-08-22 NOTE — Telephone Encounter (Signed)
Pt called saying he can not take the ibuprofen per his GI doctor.  [place]

## 2019-08-22 NOTE — Telephone Encounter (Signed)
Patient advised and verbally voiced understanding. Prescription sent into pharmacy.  

## 2019-08-22 NOTE — Telephone Encounter (Signed)
Ok, have sent prescription for celebrex to his pharmacy.

## 2019-08-22 NOTE — Telephone Encounter (Signed)
-----   Message from Birdie Sons, MD sent at 08/22/2019 11:19 AM EDT ----- [place] Brun[place] are normal, no fractures or dislocations. He needs to apply ice to shoulder for 10 minutes three times a day today and tomorrow. Please send in prescription for ibuprofen 600mg  Q6hours prn shoulder pain, #30, rf x 0. Will take 1-2 weeks to heal, call if not much better by next week.

## 2019-08-23 IMAGING — CR DG CHEST 2V
1 series · 2 of 2 positions shown · non-contrast
Comparison: October 28, 2016

CLINICAL DATA: Cough and shortness of breath

EXAM:
CHEST - 2 VIEW

[Series 1: dg chest 2 view · 0.14mm/px · 2 of 2 slices shown]
[im 1/2]
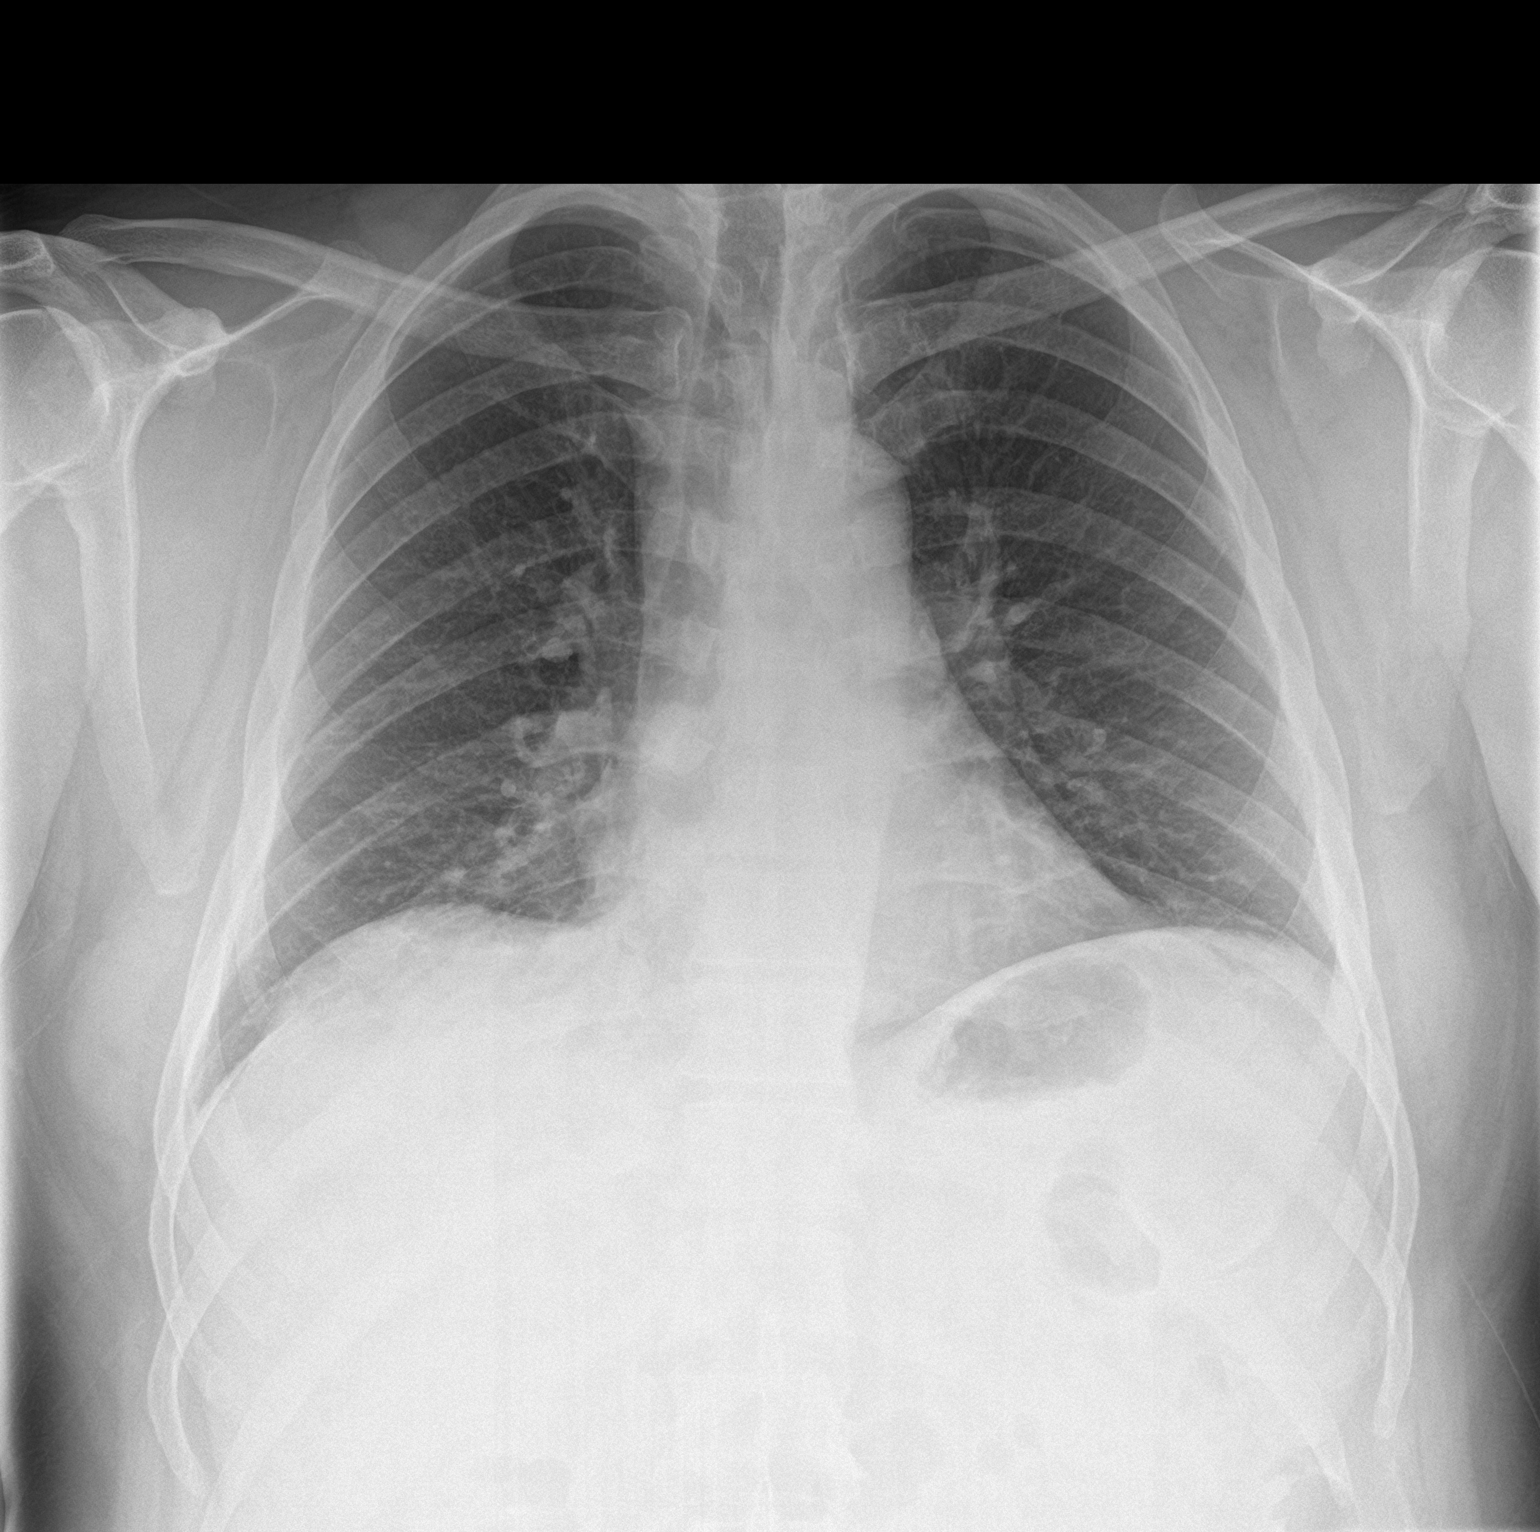
[im 2/2]
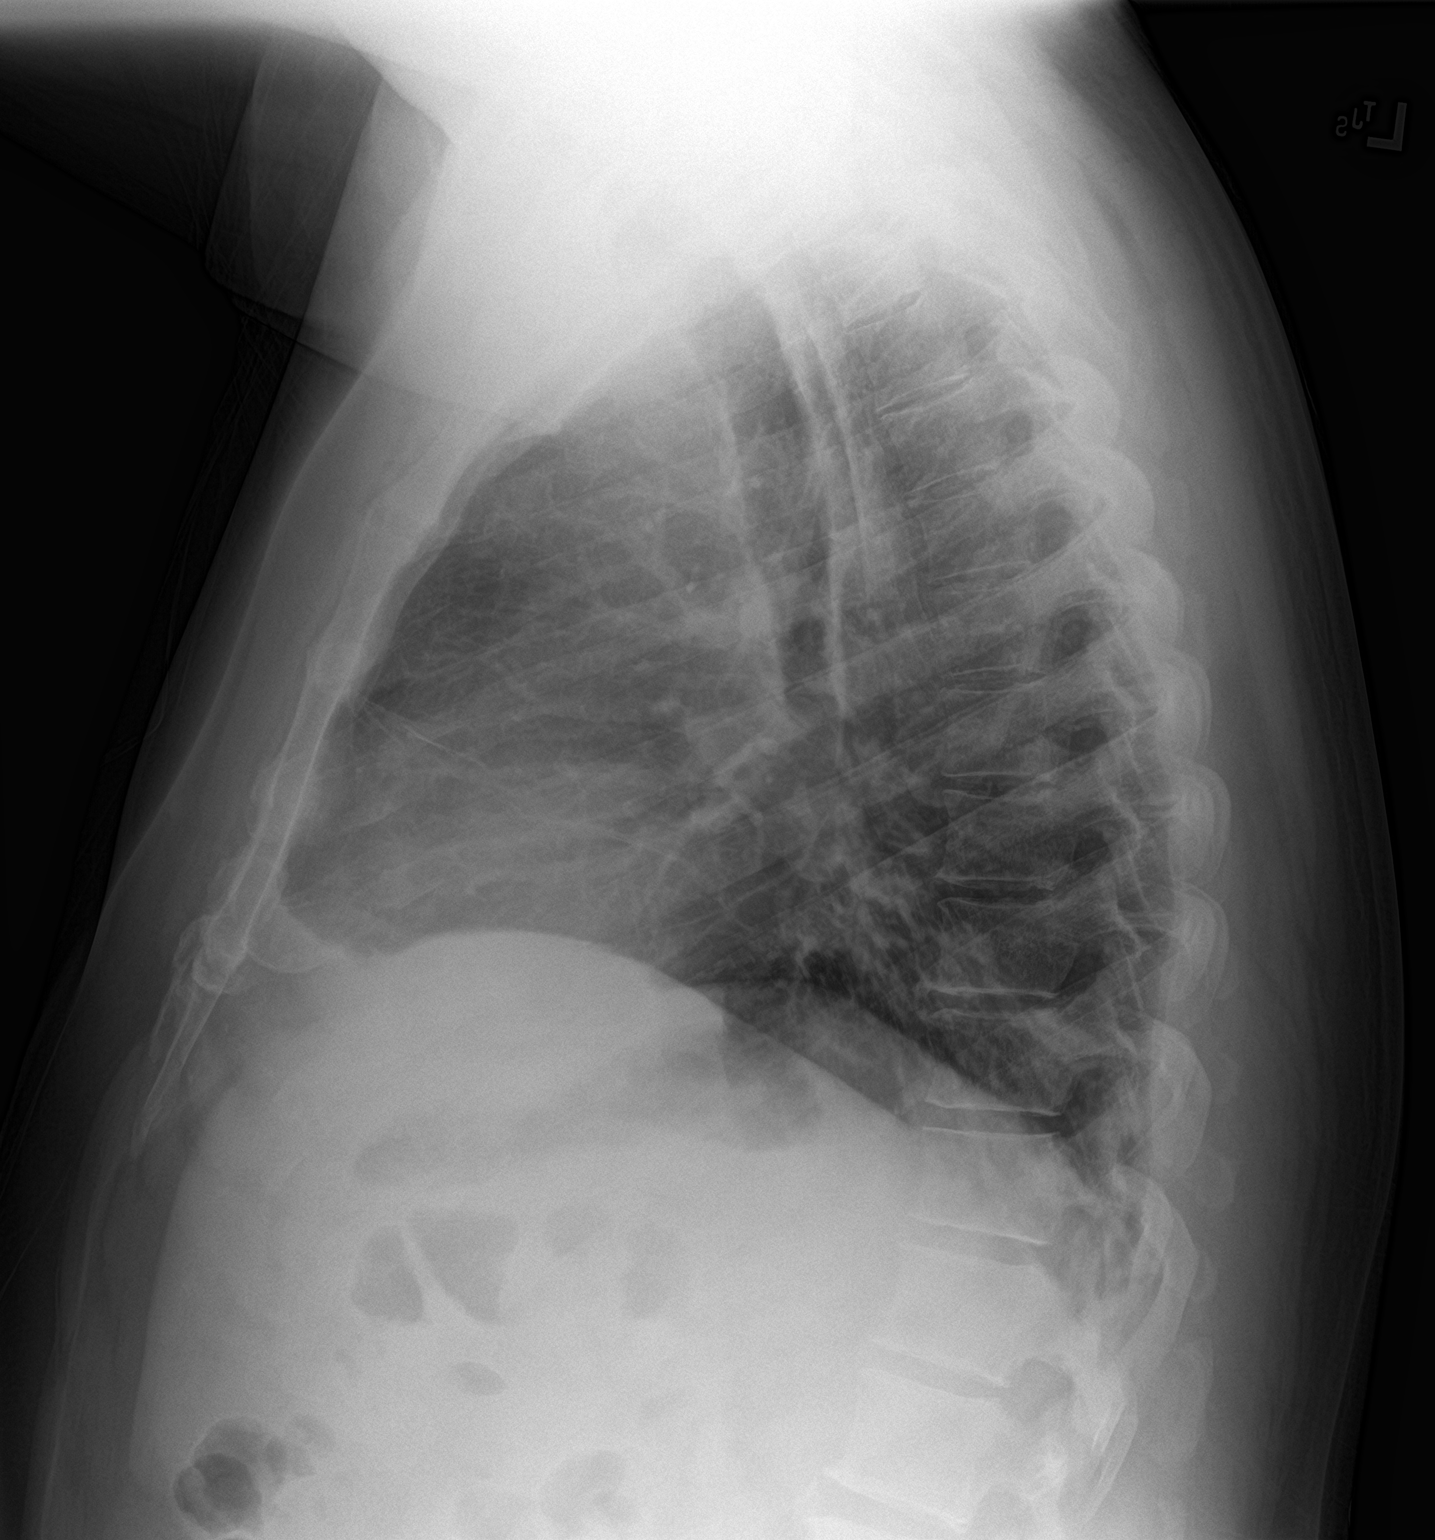

[2 of 2 positions shown; findings below may reference images not displayed]

FINDINGS: Lungs are clear. Heart size and pulmonary vascularity are normal. No
adenopathy. No bone lesions.
IMPRESSION: No edema or consolidation.

## 2019-08-23 NOTE — Telephone Encounter (Signed)
Patient advised as below.  

## 2019-08-31 NOTE — Patient Instructions (Signed)
.   Please review the attached list of medications and notify my office if there are any errors.   . Please bring all of your medications to every appointment so we can make sure that our medication list is the same as yours.   . It is especially important to get the annual flu vaccine this year. If you haven't had it already, please go to your pharmacy or call the office as soon as possible to schedule you flu shot.  

## 2019-09-05 ENCOUNTER — Encounter
Admission: RE | Admit: 2019-09-05 | Discharge: 2019-09-05 | Disposition: A | Discharge status: Home / Self Care | Payer: BC Managed Care – PPO | Source: Ambulatory Visit | Attending: General Surgery | Admitting: General Surgery

## 2019-09-05 ENCOUNTER — Other Ambulatory Visit: Payer: Self-pay

## 2019-09-05 HISTORY — DX: Fatty (change of) liver, not elsewhere classified: K76.0

## 2019-09-05 NOTE — Patient Instructions (Signed)
Your procedure is scheduled on: Mon. 10/19 Report to Day Surgery. To find out your arrival time please call [phone] between 1PM - 3PM on Friday 10/16.  Remember: Instructions that are not followed completely may result in serious medical risk,  up to and including death, or upon the discretion of your surgeon and anesthesiologist your  surgery may need to be rescheduled.     _X__ 1. Do not eat food after midnight the night before your procedure.                 No gum chewing or hard candies. You may drink clear liquids up to 2 hours                 before you are scheduled to arrive for your surgery- DO not drink clear                 liquids within 2 hours of the start of your surgery.                 Clear Liquids include:  water, apple juice without pulp, clear carbohydrate                 drink such as Clearfast of Gatorade, Black Coffee or Tea (Do not add                 anything to coffee or tea).  __X__2.  On the morning of surgery brush your teeth with toothpaste and water, you                may rinse your mouth with mouthwash if you wish.  Do not swallow any toothpaste of mouthwash.     _X__ 3.  No Alcohol for 24 hours before or after surgery.   __ 4.  Do Not Smoke or use e-cigarettes For 24 Hours Prior to Your Surgery.                 Do not use any chewable tobacco products for at least 6 hours prior to                 surgery.  ____  5.  Bring all medications with you on the day of surgery if instructed.   __x__  6.  Notify your doctor if there is any change in your medical condition      (cold, fever, infections).     Do not wear jewelry, make-up, hairpins, clips or nail polish. Do not wear lotions, powders, or perfumes. You may wear deodorant. Do not shave 48 hours prior to surgery. Men may shave face and neck. Do not bring valuables to the hospital.    [facility] is not responsible for any belongings or valuables.  Contacts,  dentures or bridgework may not be worn into surgery. Leave your suitcase in the car. After surgery it may be brought to your room. For patients admitted to the hospital, discharge time is determined by your treatment team.   Patients discharged the day of surgery will not be allowed to drive home.   Please read over the following fact sheets that you were given:    __x__ Take these medicines the morning of surgery with A SIP OF WATER:    1. fexofenadine (ALLEGRA) 180 MG tablet  2. esomeprazole (NEXIUM) 20 MG capsule dose the night before and one the morning of surgery  3. celecoxib (CELEBREX) 100 MG capsule  2 capsules  4.allopurinol (ZYLOPRIM)  100 MG tablet  5.  6.  ____ Fleet Enema (as directed)   __x__ Use CHG Soap as directed  ____ Use inhalers on the day of surgery  ____ Stop metformin 2 days prior to surgery    ____ Take 1/2 of usual insulin dose the night before surgery. No insulin the morning          of surgery.   ____ Stop Coumadin/Plavix/aspirin on   __x__ Stop Anti-inflammatories ibuprofen (ADVIL) 600 MG tablet, indomethacin (INDOCIN) 50 MG capsule today   ____ Stop supplements until after surgery.    _x___ Bring C-Pap to the hospital.

## 2019-09-08 ENCOUNTER — Other Ambulatory Visit
Admission: RE | Admit: 2019-09-08 | Discharge: 2019-09-08 | Disposition: A | Discharge status: Home / Self Care | Payer: BC Managed Care – PPO | Source: Ambulatory Visit | Attending: General Surgery | Admitting: General Surgery

## 2019-09-08 ENCOUNTER — Other Ambulatory Visit: Payer: Self-pay

## 2019-09-08 DIAGNOSIS — Z01812 Encounter for preprocedural laboratory examination: Secondary | ICD-10-CM | POA: Diagnosis not present

## 2019-09-08 DIAGNOSIS — Z20828 Contact with and (suspected) exposure to other viral communicable diseases: Secondary | ICD-10-CM | POA: Diagnosis not present

## 2019-09-08 LAB — SARS CORONAVIRUS 2 (TAT 6-24 HRS): SARS Coronavirus 2: NEGATIVE

## 2019-09-12 ENCOUNTER — Ambulatory Visit
Admission: RE | Admit: 2019-09-12 | Discharge: 2019-09-12 | Disposition: A | Discharge status: Home / Self Care | Payer: BC Managed Care – PPO | Attending: General Surgery | Admitting: General Surgery

## 2019-09-12 ENCOUNTER — Ambulatory Visit: Payer: BC Managed Care – PPO | Admitting: Certified Registered"

## 2019-09-12 ENCOUNTER — Encounter
Admission: RE | Disposition: A | Discharge status: Home / Self Care | Payer: Self-pay | Source: Home / Self Care | Attending: General Surgery

## 2019-09-12 ENCOUNTER — Other Ambulatory Visit: Payer: Self-pay

## 2019-09-12 DIAGNOSIS — Z6833 Body mass index (BMI) 33.0-33.9, adult: Secondary | ICD-10-CM | POA: Insufficient documentation

## 2019-09-12 DIAGNOSIS — K219 Gastro-esophageal reflux disease without esophagitis: Secondary | ICD-10-CM | POA: Insufficient documentation

## 2019-09-12 DIAGNOSIS — Z79899 Other long term (current) drug therapy: Secondary | ICD-10-CM | POA: Diagnosis not present

## 2019-09-12 DIAGNOSIS — E669 Obesity, unspecified: Secondary | ICD-10-CM | POA: Diagnosis not present

## 2019-09-12 DIAGNOSIS — K8010 Calculus of gallbladder with chronic cholecystitis without obstruction: Secondary | ICD-10-CM | POA: Insufficient documentation

## 2019-09-12 DIAGNOSIS — E7800 Pure hypercholesterolemia, unspecified: Secondary | ICD-10-CM | POA: Diagnosis not present

## 2019-09-12 DIAGNOSIS — E785 Hyperlipidemia, unspecified: Secondary | ICD-10-CM | POA: Insufficient documentation

## 2019-09-12 DIAGNOSIS — F419 Anxiety disorder, unspecified: Secondary | ICD-10-CM | POA: Insufficient documentation

## 2019-09-12 DIAGNOSIS — Z7951 Long term (current) use of inhaled steroids: Secondary | ICD-10-CM | POA: Insufficient documentation

## 2019-09-12 DIAGNOSIS — K8020 Calculus of gallbladder without cholecystitis without obstruction: Secondary | ICD-10-CM | POA: Diagnosis not present

## 2019-09-12 DIAGNOSIS — G4733 Obstructive sleep apnea (adult) (pediatric): Secondary | ICD-10-CM | POA: Insufficient documentation

## 2019-09-12 HISTORY — PX: CHOLECYSTECTOMY: SHX55

## 2019-09-12 SURGERY — LAPAROSCOPIC CHOLECYSTECTOMY
Anesthesia: General

## 2019-09-12 MED ORDER — CHLORHEXIDINE GLUCONATE CLOTH 2 % EX PADS: 6.0000 | MEDICATED_PAD | Freq: Once | CUTANEOUS | Status: DC

## 2019-09-12 MED ORDER — GABAPENTIN 300 MG PO CAPS
300.0000 mg | ORAL_CAPSULE | ORAL | Status: AC
  Administered 2019-09-12: 09:00:00 300 mg via ORAL

## 2019-09-12 MED ORDER — EVICEL 5 ML EX KIT
PACK | CUTANEOUS | Status: DC | PRN
  Administered 2019-09-12: 5 mL

## 2019-09-12 MED ORDER — LIDOCAINE HCL (CARDIAC) PF 100 MG/5ML IV SOSY
PREFILLED_SYRINGE | INTRAVENOUS | Status: DC | PRN
  Administered 2019-09-12: 100 mg via INTRAVENOUS

## 2019-09-12 MED ORDER — MIDAZOLAM HCL 2 MG/2ML IJ SOLN
INTRAMUSCULAR | Status: DC | PRN
  Administered 2019-09-12: 2 mg via INTRAVENOUS

## 2019-09-12 MED ORDER — EVICEL 5 ML EX KIT
PACK | CUTANEOUS | Status: AC
  Filled 2019-09-12: qty 1

## 2019-09-12 MED ORDER — PROPOFOL 10 MG/ML IV BOLUS
INTRAVENOUS | Status: DC | PRN
  Administered 2019-09-12: 200 mg via INTRAVENOUS

## 2019-09-12 MED ORDER — OXYCODONE HCL 5 MG PO TABS
5.0000 mg | ORAL_TABLET | Freq: Once | ORAL | Status: AC | PRN
  Administered 2019-09-12: 13:00:00 5 mg via ORAL

## 2019-09-12 MED ORDER — BUPIVACAINE HCL (PF) 0.25 % IJ SOLN
INTRAMUSCULAR | Status: AC
  Filled 2019-09-12: qty 30

## 2019-09-12 MED ORDER — GABAPENTIN 300 MG PO CAPS
ORAL_CAPSULE | ORAL | Status: AC
  Administered 2019-09-12: 09:00:00 300 mg via ORAL
  Filled 2019-09-12: qty 1

## 2019-09-12 MED ORDER — LIDOCAINE HCL (PF) 2 % IJ SOLN
INTRAMUSCULAR | Status: AC
  Filled 2019-09-12: qty 20

## 2019-09-12 MED ORDER — FENTANYL CITRATE (PF) 100 MCG/2ML IJ SOLN
INTRAMUSCULAR | Status: DC | PRN
  Administered 2019-09-12: 50 ug via INTRAVENOUS
  Administered 2019-09-12: 25 ug via INTRAVENOUS

## 2019-09-12 MED ORDER — PROPOFOL 10 MG/ML IV BOLUS
INTRAVENOUS | Status: AC
  Filled 2019-09-12: qty 20

## 2019-09-12 MED ORDER — MIDAZOLAM HCL 2 MG/2ML IJ SOLN
INTRAMUSCULAR | Status: AC
  Filled 2019-09-12: qty 2

## 2019-09-12 MED ORDER — DEXAMETHASONE SODIUM PHOSPHATE 10 MG/ML IJ SOLN
INTRAMUSCULAR | Status: AC
  Filled 2019-09-12: qty 2

## 2019-09-12 MED ORDER — ONDANSETRON HCL 4 MG/2ML IJ SOLN
INTRAMUSCULAR | Status: DC | PRN
  Administered 2019-09-12 (×2): 4 mg via INTRAVENOUS

## 2019-09-12 MED ORDER — LIDOCAINE-EPINEPHRINE 1 %-1:100000 IJ SOLN
INTRAMUSCULAR | Status: AC
  Filled 2019-09-12: qty 1

## 2019-09-12 MED ORDER — GLYCOPYRROLATE 0.2 MG/ML IJ SOLN
INTRAMUSCULAR | Status: AC
  Filled 2019-09-12: qty 2

## 2019-09-12 MED ORDER — SUCCINYLCHOLINE CHLORIDE 20 MG/ML IJ SOLN
INTRAMUSCULAR | Status: AC
  Filled 2019-09-12: qty 1

## 2019-09-12 MED ORDER — DEXMEDETOMIDINE HCL IN NACL 200 MCG/50ML IV SOLN
INTRAVENOUS | Status: DC | PRN
  Administered 2019-09-12: 8 ug via INTRAVENOUS
  Administered 2019-09-12: 12 ug via INTRAVENOUS

## 2019-09-12 MED ORDER — SUCCINYLCHOLINE CHLORIDE 20 MG/ML IJ SOLN
INTRAMUSCULAR | Status: DC | PRN
  Administered 2019-09-12: 120 mg via INTRAVENOUS

## 2019-09-12 MED ORDER — FENTANYL CITRATE (PF) 100 MCG/2ML IJ SOLN
25.0000 ug | INTRAMUSCULAR | Status: AC | PRN
  Administered 2019-09-12 (×6): 25 ug via INTRAVENOUS

## 2019-09-12 MED ORDER — CEFAZOLIN SODIUM-DEXTROSE 2-4 GM/100ML-% IV SOLN
INTRAVENOUS | Status: AC
  Filled 2019-09-12: qty 100

## 2019-09-12 MED ORDER — MEPERIDINE HCL 50 MG/ML IJ SOLN: 6.2500 mg | INTRAMUSCULAR | Status: DC | PRN

## 2019-09-12 MED ORDER — CELECOXIB 200 MG PO CAPS: 200.0000 mg | ORAL_CAPSULE | ORAL | Status: DC

## 2019-09-12 MED ORDER — ONDANSETRON HCL 4 MG/2ML IJ SOLN
INTRAMUSCULAR | Status: AC
  Filled 2019-09-12: qty 6

## 2019-09-12 MED ORDER — HYDROCODONE-ACETAMINOPHEN 5-325 MG PO TABS: 1.0000 | ORAL_TABLET | Freq: Four times a day (QID) | ORAL | 0 refills | Status: DC | PRN

## 2019-09-12 MED ORDER — PHENYLEPHRINE HCL (PRESSORS) 10 MG/ML IV SOLN
INTRAVENOUS | Status: AC
  Filled 2019-09-12: qty 1

## 2019-09-12 MED ORDER — EPHEDRINE SULFATE 50 MG/ML IJ SOLN
INTRAMUSCULAR | Status: AC
  Filled 2019-09-12: qty 1

## 2019-09-12 MED ORDER — FENTANYL CITRATE (PF) 100 MCG/2ML IJ SOLN
INTRAMUSCULAR | Status: AC
  Filled 2019-09-12: qty 2

## 2019-09-12 MED ORDER — OXYCODONE HCL 5 MG/5ML PO SOLN: 5.0000 mg | Freq: Once | ORAL | Status: AC | PRN

## 2019-09-12 MED ORDER — OXYCODONE HCL 5 MG PO TABS
ORAL_TABLET | ORAL | Status: AC
  Filled 2019-09-12: qty 1

## 2019-09-12 MED ORDER — LIDOCAINE-EPINEPHRINE 1 %-1:100000 IJ SOLN
INTRAMUSCULAR | Status: DC | PRN
  Administered 2019-09-12: 25 mL via INTRAMUSCULAR

## 2019-09-12 MED ORDER — SUGAMMADEX SODIUM 500 MG/5ML IV SOLN
INTRAVENOUS | Status: AC
Start: 2019-09-12 — End: ?
  Filled 2019-09-12: qty 10

## 2019-09-12 MED ORDER — PROMETHAZINE HCL 25 MG/ML IJ SOLN: 6.2500 mg | INTRAMUSCULAR | Status: DC | PRN

## 2019-09-12 MED ORDER — SUGAMMADEX SODIUM 500 MG/5ML IV SOLN
INTRAVENOUS | Status: DC | PRN
  Administered 2019-09-12: 440 mg via INTRAVENOUS

## 2019-09-12 MED ORDER — FENTANYL CITRATE (PF) 100 MCG/2ML IJ SOLN
INTRAMUSCULAR | Status: AC
  Administered 2019-09-12: 13:00:00 25 ug via INTRAVENOUS
  Filled 2019-09-12: qty 2

## 2019-09-12 MED ORDER — EPHEDRINE SULFATE 50 MG/ML IJ SOLN
INTRAMUSCULAR | Status: DC | PRN
  Administered 2019-09-12: 5 mg via INTRAVENOUS

## 2019-09-12 MED ORDER — PHENYLEPHRINE HCL (PRESSORS) 10 MG/ML IV SOLN
INTRAVENOUS | Status: DC | PRN
  Administered 2019-09-12: 100 ug via INTRAVENOUS

## 2019-09-12 MED ORDER — ACETAMINOPHEN 500 MG PO TABS
1000.0000 mg | ORAL_TABLET | ORAL | Status: AC
  Administered 2019-09-12: 09:00:00 1000 mg via ORAL

## 2019-09-12 MED ORDER — LACTATED RINGERS IV SOLN
INTRAVENOUS | Status: DC | PRN
  Administered 2019-09-12 (×2): via INTRAVENOUS

## 2019-09-12 MED ORDER — IBUPROFEN 800 MG PO TABS: 800.0000 mg | ORAL_TABLET | Freq: Three times a day (TID) | ORAL | 0 refills | Status: DC | PRN

## 2019-09-12 MED ORDER — HEMOSTATIC AGENTS (NO CHARGE) OPTIME
TOPICAL | Status: DC | PRN
  Administered 2019-09-12: 1 via TOPICAL

## 2019-09-12 MED ORDER — GLYCOPYRROLATE 0.2 MG/ML IJ SOLN
INTRAMUSCULAR | Status: DC | PRN
  Administered 2019-09-12: 0.2 mg via INTRAVENOUS

## 2019-09-12 MED ORDER — ROCURONIUM BROMIDE 100 MG/10ML IV SOLN
INTRAVENOUS | Status: DC | PRN
  Administered 2019-09-12 (×2): 10 mg via INTRAVENOUS
  Administered 2019-09-12: 20 mg via INTRAVENOUS

## 2019-09-12 MED ORDER — FENTANYL CITRATE (PF) 100 MCG/2ML IJ SOLN
INTRAMUSCULAR | Status: AC
  Administered 2019-09-12: 12:00:00 25 ug via INTRAVENOUS
  Filled 2019-09-12: qty 2

## 2019-09-12 MED ORDER — ACETAMINOPHEN 500 MG PO TABS
ORAL_TABLET | ORAL | Status: AC
  Administered 2019-09-12: 09:00:00 1000 mg via ORAL
  Filled 2019-09-12: qty 2

## 2019-09-12 MED ORDER — LACTATED RINGERS IV SOLN
INTRAVENOUS | Status: DC
  Administered 2019-09-12: 09:00:00 via INTRAVENOUS

## 2019-09-12 MED ORDER — DEXAMETHASONE SODIUM PHOSPHATE 10 MG/ML IJ SOLN
INTRAMUSCULAR | Status: DC | PRN
  Administered 2019-09-12: 10 mg via INTRAVENOUS

## 2019-09-12 MED ORDER — CEFAZOLIN SODIUM-DEXTROSE 2-4 GM/100ML-% IV SOLN
2.0000 g | INTRAVENOUS | Status: AC
  Administered 2019-09-12: 2 g via INTRAVENOUS

## 2019-09-12 MED ORDER — ROCURONIUM BROMIDE 50 MG/5ML IV SOLN
INTRAVENOUS | Status: AC
  Filled 2019-09-12: qty 4

## 2019-09-12 SURGICAL SUPPLY — 58 items
ADH SKN CLS APL DERMABOND .7 (GAUZE/BANDAGES/DRESSINGS) ×1
APL PRP STRL LF DISP 70% ISPRP (MISCELLANEOUS) ×1
APPLIER CLIP 5 13 M/L LIGAMAX5 (MISCELLANEOUS) ×2
APPLIER CLIP ROT 10 11.4 M/L (STAPLE) ×2
APR CLP MED LRG 11.4X10 (STAPLE) ×1
APR CLP MED LRG 5 ANG JAW (MISCELLANEOUS) ×1
BAG SPEC RTRVL LRG 6X4 10 (ENDOMECHANICALS) ×1
BLADE CLIPPER SURG (BLADE) ×1 IMPLANT
BLADE SURG SZ11 CARB STEEL (BLADE) ×2 IMPLANT
CANISTER SUCT 1200ML W/VALVE (MISCELLANEOUS) ×2 IMPLANT
CHLORAPREP W/TINT 26 (MISCELLANEOUS) ×2 IMPLANT
CLIP APPLIE 5 13 M/L LIGAMAX5 (MISCELLANEOUS) ×1 IMPLANT
CLIP APPLIE ROT 10 11.4 M/L (STAPLE) IMPLANT
COVER WAND RF STERILE (DRAPES) ×1 IMPLANT
CUTTER FLEX LINEAR 45M (STAPLE) ×1 IMPLANT
DECANTER SPIKE VIAL GLASS SM (MISCELLANEOUS) ×3 IMPLANT
DEFOGGER SCOPE WARMER CLEARIFY (MISCELLANEOUS) ×2 IMPLANT
DERMABOND ADVANCED (GAUZE/BANDAGES/DRESSINGS) ×1
DERMABOND ADVANCED .7 DNX12 (GAUZE/BANDAGES/DRESSINGS) ×1 IMPLANT
ELECT CAUTERY BLADE TIP 2.5 (TIP) ×2
ELECT REM PT RETURN 9FT ADLT (ELECTROSURGICAL) ×2
ELECTRODE CAUTERY BLDE TIP 2.5 (TIP) ×1 IMPLANT
ELECTRODE REM PT RTRN 9FT ADLT (ELECTROSURGICAL) ×1 IMPLANT
GLOVE BIO SURGEON STRL SZ 6.5 (GLOVE) ×6 IMPLANT
GLOVE INDICATOR 7.0 STRL GRN (GLOVE) ×5 IMPLANT
GOWN STRL REUS W/ TWL LRG LVL3 (GOWN DISPOSABLE) ×3 IMPLANT
GOWN STRL REUS W/TWL LRG LVL3 (GOWN DISPOSABLE) ×6
GRASPER SUT TROCAR 14GX15 (MISCELLANEOUS) ×1 IMPLANT
HEMOSTAT SURGICEL 2X3 (HEMOSTASIS) ×1 IMPLANT
IRRIGATION STRYKERFLOW (MISCELLANEOUS) ×1 IMPLANT
IRRIGATOR STRYKERFLOW (MISCELLANEOUS) ×2
IV NS 1000ML (IV SOLUTION) ×2
IV NS 1000ML BAXH (IV SOLUTION) ×1 IMPLANT
KIT TURNOVER KIT A (KITS) ×2 IMPLANT
KITTNER LAPARASCOPIC 5X40 (MISCELLANEOUS) ×1 IMPLANT
LABEL OR SOLS (LABEL) ×2 IMPLANT
NEEDLE HYPO 22GX1.5 SAFETY (NEEDLE) ×2 IMPLANT
NS IRRIG 500ML POUR BTL (IV SOLUTION) ×2 IMPLANT
PACK LAP CHOLECYSTECTOMY (MISCELLANEOUS) ×2 IMPLANT
PENCIL ELECTRO HAND CTR (MISCELLANEOUS) ×2 IMPLANT
POUCH SPECIMEN RETRIEVAL 10MM (ENDOMECHANICALS) ×2 IMPLANT
RELOAD STAPLE 45 3.5 BLU ETS (ENDOMECHANICALS) IMPLANT
RELOAD STAPLE TA45 3.5 REG BLU (ENDOMECHANICALS) ×2 IMPLANT
SCISSORS METZENBAUM CVD 33 (INSTRUMENTS) ×2 IMPLANT
SET TUBE SMOKE EVAC HIGH FLOW (TUBING) ×2 IMPLANT
SLEEVE ADV FIXATION 5X100MM (TROCAR) ×4 IMPLANT
SOLUTION ELECTROLUBE (MISCELLANEOUS) ×2 IMPLANT
STRIP CLOSURE SKIN 1/2X4 (GAUZE/BANDAGES/DRESSINGS) ×2 IMPLANT
SUT MNCRL 4-0 (SUTURE) ×2
SUT MNCRL 4-0 27XMFL (SUTURE) ×1
SUT VIC AB 3-0 SH 27 (SUTURE)
SUT VIC AB 3-0 SH 27X BRD (SUTURE) ×1 IMPLANT
SUT VICRYL 0 AB UR-6 (SUTURE) ×4 IMPLANT
SUTURE MNCRL 4-0 27XMF (SUTURE) ×1 IMPLANT
TIP RIGID 35CM EVICEL (HEMOSTASIS) ×1 IMPLANT
TROCAR ADV FIXATION 12X100MM (TROCAR) ×2 IMPLANT
TROCAR Z-THREAD OPTICAL 5X100M (TROCAR) ×2 IMPLANT
WATER STERILE IRR 1000ML POUR (IV SOLUTION) ×1 IMPLANT

## 2019-09-12 NOTE — Anesthesia Preprocedure Evaluation (Signed)
Anesthesia Evaluation  Patient identified by MRN, date of birth, ID band Patient awake    Reviewed: Allergy & Precautions, NPO status , Patient's Chart, lab work & pertinent test results  History of Anesthesia Complications Negative for: history of anesthetic complications  Airway Mallampati: III  TM Distance: >3 FB Neck ROM: Full    Dental no notable dental hx.    Pulmonary sleep apnea and Continuous Positive Airway Pressure Ventilation , neg COPD,    breath sounds clear to auscultation- rhonchi (-) wheezing      Cardiovascular Exercise Tolerance: Good (-) hypertension(-) CAD, (-) Past MI, (-) Cardiac Stents and (-) CABG  Rhythm:Regular Rate:Normal - Systolic murmurs and - Diastolic murmurs    Neuro/Psych neg Seizures PSYCHIATRIC DISORDERS Anxiety negative neurological ROS     GI/Hepatic GERD  ,Fatty liver    Endo/Other  negative endocrine ROSneg diabetes  Renal/GU negative Renal ROS     Musculoskeletal negative musculoskeletal ROS (+)   Abdominal (+) + obese,   Peds  Hematology negative hematology ROS (+)   Anesthesia Other Findings Past Medical History: No date: Acute stress disorder No date: Allergic rhinitis No date: Esophageal reflux No date: Fatty liver 2016: Gout 2001: Hearing loss No date: Hypercholesterolemia No date: Hyperlipidemia No date: Lipoma of abdominal wall No date: Lumbago No date: Multiple benign nevi No date: OSA (obstructive sleep apnea)     Comment:  CPAP   Reproductive/Obstetrics                             Anesthesia Physical Anesthesia Plan  ASA: II  Anesthesia Plan: General   Post-op Pain Management:    Induction: Intravenous  PONV Risk Score and Plan: 1 and Ondansetron, Dexamethasone and Midazolam  Airway Management Planned: Oral ETT  Additional Equipment:   Intra-op Plan:   Post-operative Plan: Extubation in OR  Informed Consent: I  have reviewed the patients History and Physical, chart, labs and discussed the procedure including the risks, benefits and alternatives for the proposed anesthesia with the patient or authorized representative who has indicated his/her understanding and acceptance.     Dental advisory given  Plan Discussed with: CRNA and Anesthesiologist  Anesthesia Plan Comments:         Anesthesia Quick Evaluation

## 2019-09-12 NOTE — Anesthesia Post-op Follow-up Note (Signed)
Anesthesia QCDR form completed.        

## 2019-09-12 NOTE — Discharge Instructions (Signed)

## 2019-09-12 NOTE — Transfer of Care (Signed)
Immediate Anesthesia Transfer of Care Note  Patient: Russell Pineda  Procedure(s) Performed: LAPAROSCOPIC CHOLECYSTECTOMY (N/A )  Patient Location: PACU  Anesthesia Type:General  Level of Consciousness: drowsy, patient cooperative and responds to stimulation  Airway & Oxygen Therapy: Patient Spontanous Breathing and Patient connected to face mask oxygen  Post-op Assessment: Report given to RN and Post -op Vital signs reviewed and stable  Post vital signs: Reviewed and stable  Last Vitals:  Vitals Value Taken Time  BP 119/80 09/12/19 1155  Temp 36.1 C 09/12/19 1155  Pulse 65 09/12/19 1158  Resp 15 09/12/19 1158  SpO2 100 % 09/12/19 1158  Vitals shown include unvalidated device data.  Last Pain:  Vitals:   09/12/19 1155  TempSrc:   PainSc: Asleep         Complications: No apparent anesthesia complications

## 2019-09-12 NOTE — Op Note (Signed)
Laparoscopic Cholecystectomy  Pre-operative Diagnosis: Symptomatic cholelithiasis  Post-operative Diagnosis: Same  Procedure: Laparoscopic cholecystectomy  Surgeon: Fredirick Maudlin, MD  Anesthesia: GETA  Assistant: None   Findings: There was a stone wedged in the cystic duct, causing dilation of the cystic duct.  The duct was too large to cross with a clip applier, therefore it was stapled.  There was no inflammation suggestive of cholecystitis.  Estimated Blood Loss: 50 cc         Drains: None         Specimens: Gallbladder           Complications: none   Procedure Details  The patient was seen again in the preoperative holding area. The benefits, complications, treatment options, and expected outcomes were discussed with the patient. The risks of bleeding, infection, recurrence of symptoms, failure to resolve symptoms, bile duct damage, bile duct leak, retained common bile duct stone, bowel injury, any of which could require further surgery and/or ERCP, stent, or papillotomy were reviewed with the patient. The likelihood of improving the patient's symptoms with return to their baseline status is good.  The patient and/or family concurred with the proposed plan, giving informed consent.  The patient was taken to operating room, identified as Spiro T Frieling and the procedure verified as Laparoscopic Cholecystectomy. A time out was performed and the above information confirmed.  Prior to the induction of general anesthesia, antibiotic prophylaxis was administered. VTE prophylaxis was in place. General endotracheal anesthesia was then administered and tolerated well. After the induction, the abdomen was prepped with Chloraprep and draped in the sterile fashion. The patient was positioned in the supine position.  Optiview technique was used to enter the abdominal cavity in the right upper quadrant via a 5 mm port.  Pneumoperitoneum was then created with CO2 and tolerated well without  any adverse changes in the patient's vital signs.  A periumbilical 12 mm port and 2 additional 5-mm ports were placed in the right upper quadrant all under direct vision. All skin incisions  were infiltrated with a local anesthetic agent before making the incision and placing the trocars.   The patient was positioned  in reverse Trendelenburg, tilted slightly to the patient's left.  The gallbladder was identified, the fundus grasped and retracted cephalad. Adhesions were lysed bluntly. The infundibulum was grasped and retracted laterally, exposing the peritoneum overlying the triangle of Calot. This was then divided and exposed in a blunt fashion. An extended critical view of the cystic duct and cystic artery was obtained.  The cystic duct was clearly identified and bluntly dissected free.  It was markedly dilated.  I was unable to get a 5 mm or 10 mm clip across the cystic duct.  A blue load of the Endo GIA stapler was utilized to divide the cystic duct.  The cystic artery was then double clipped and divided.  The gallbladder was taken from the gallbladder fossa in a retrograde fashion with the electrocautery. The gallbladder was removed and placed in an Endo pouch bag. The liver bed was irrigated and inspected. Hemostasis was achieved with the electrocautery. Copious saline irrigation was utilized and was repeatedly aspirated until clear.  The gallbladder and Endo pouch sac were then removed through a port site.   Inspection of the right upper quadrant was performed. No bleeding, bile duct injury or leak, or bowel injury was noted.  There was a small amount of oozing from the liver bed that was treated with electrocautery and Evicel.  Pneumoperitoneum  was released.  The periumbilical port site was closed with interrumpted 0 Vicryl sutures. 4-0 subcuticular Monocryl was used to close the skin. Dermabond and Steri-Strips were applied.  The gallbladder specimen was carefully inspected and confirmed to have a  single ductal structure exiting it.  The patient was then extubated and brought to the recovery room in stable condition. Sponge, lap, and needle counts were correct at closure and at the conclusion of the case.               Fredirick Maudlin, MD, FACS

## 2019-09-12 NOTE — Anesthesia Postprocedure Evaluation (Signed)
Anesthesia Post Note  Patient: Russell Pineda  Procedure(s) Performed: LAPAROSCOPIC CHOLECYSTECTOMY (N/A )  Patient location during evaluation: PACU Anesthesia Type: General Level of consciousness: awake and alert Pain management: pain level controlled Vital Signs Assessment: post-procedure vital signs reviewed and stable Respiratory status: spontaneous breathing, nonlabored ventilation, respiratory function stable and patient connected to nasal cannula oxygen Cardiovascular status: blood pressure returned to baseline and stable Postop Assessment: no apparent nausea or vomiting Anesthetic complications: no     Last Vitals:  Vitals:   09/12/19 1335 09/12/19 1351  BP: 131/87 127/80  Pulse: 75 74  Resp: 12 16  Temp: (!) 36.3 C (!) 36.4 C  SpO2: 96% 99%    Last Pain:  Vitals:   09/12/19 1351  TempSrc: Temporal  PainSc: 5                  Martha Clan

## 2019-09-12 NOTE — Interval H&P Note (Signed)
History and Physical Interval Note:  09/12/2019 9:24 AM  Russell Pineda  has presented today for surgery, with the diagnosis of symptomatic cholelithiasis.  The various methods of treatment have been discussed with the patient and family. After consideration of risks, benefits and other options for treatment, the patient has consented to  Procedure(s): LAPAROSCOPIC CHOLECYSTECTOMY (N/A) as a surgical intervention.  The patient's history has been reviewed, patient examined, no change in status, stable for surgery.  I have reviewed the patient's chart and labs.  Questions were answered to the patient's satisfaction.     Fredirick Maudlin

## 2019-09-12 NOTE — Anesthesia Procedure Notes (Signed)
Procedure Name: Intubation Performed by: Kelton Pillar, CRNA Pre-anesthesia Checklist: Patient identified, Emergency Drugs available, Suction available and Patient being monitored Patient Re-evaluated:Patient Re-evaluated prior to induction Oxygen Delivery Method: Circle system utilized Preoxygenation: Pre-oxygenation with 100% oxygen Induction Type: IV induction and Cricoid Pressure applied Ventilation: Two handed mask ventilation required Laryngoscope Size: McGraph and 4 Grade View: Grade II Tube type: Oral Tube size: 7.5 mm Number of attempts: 1 Airway Equipment and Method: Stylet Placement Confirmation: ETT inserted through vocal cords under direct vision,  positive ETCO2,  CO2 detector and breath sounds checked- equal and bilateral Secured at: 21 cm Tube secured with: Tape Dental Injury: Teeth and Oropharynx as per pre-operative assessment  Difficulty Due To: Difficulty was anticipated and Difficult Airway- due to large tongue Future Recommendations: Recommend- induction with short-acting agent, and alternative techniques readily available

## 2019-09-13 ENCOUNTER — Encounter: Payer: Self-pay | Admitting: General Surgery

## 2019-09-13 ENCOUNTER — Telehealth: Payer: Self-pay | Admitting: Family Medicine

## 2019-09-13 LAB — SURGICAL PATHOLOGY

## 2019-09-13 NOTE — Telephone Encounter (Signed)
He can take celecoxib. It should help just as much with the pain and is easier on the stomach.

## 2019-09-13 NOTE — Telephone Encounter (Signed)
Pt was told by hospital to take Ibuprofen.  However, due to his stomach issues he was advised not to take it in the past.  He was also prescribed celecoxib (CELEBREX) 100 MG capsule.   He wants to know if he can that the Celebrex instead of the ibuprofen?  Please call pt and advise at [phone].  Thanks, [employer]

## 2019-09-14 NOTE — Telephone Encounter (Signed)
Patient advised.

## 2019-09-15 ENCOUNTER — Telehealth: Payer: Self-pay | Admitting: Family Medicine

## 2019-09-15 NOTE — Telephone Encounter (Signed)
Pt recently had surgery and the pain medication is causing constipation.  He has tried the over the counter meds and they are not helping.  Pt is asking for advice on what to do or if something can be called in to:  [id number] [address], [place] [phone] (Phone) [phone] (Fax)   Thanks, [facility]

## 2019-09-15 NOTE — Telephone Encounter (Signed)
Please review

## 2019-10-04 ENCOUNTER — Ambulatory Visit (INDEPENDENT_AMBULATORY_CARE_PROVIDER_SITE_OTHER): Payer: BC Managed Care – PPO | Admitting: General Surgery

## 2019-10-04 ENCOUNTER — Encounter: Payer: Self-pay | Admitting: General Surgery

## 2019-10-04 ENCOUNTER — Other Ambulatory Visit: Payer: Self-pay

## 2019-10-04 VITALS — BP 130/84 | HR 69 | Temp 97.7°F | Ht 71.5 in | Wt 249.4 lb

## 2019-10-04 DIAGNOSIS — Z9049 Acquired absence of other specified parts of digestive tract: Secondary | ICD-10-CM

## 2019-10-04 NOTE — Progress Notes (Signed)
Russell Pineda is here today for a postoperative visit.  He underwent a laparoscopic cholecystectomy on September 12, 2019.  The cystic duct had a stone wedged in it and was quite dilated, requiring a stapler, rather than standard clips.  Other than that, the operation was very straightforward and he has done well in the postoperative period.  He denies any nausea or vomiting.  No fevers or chills.  He is eating a regular diet and having normal bowel movements.  He has noticed some minor musculoskeletal pain but has not required any additional pain medication for this.  Today's Vitals   10/04/19 0908  BP: 130/84  Pulse: 69  Temp: 97.7 F (36.5 C)  SpO2: 97%  Weight: 249 lb 6.4 oz (113.1 kg)  Height: 5' 11.5" (1.816 m)   Body mass index is 34.3 kg/m. Focused abdominal exam: The laparoscopic port sites still have there are Steri-Strips in place.  These were removed.  The center right upper quadrant port has some minor skin separation, but there is no erythema, induration, or purulent discharge.  The other 3 are well approximated and healing nicely.  Impression and plan: This is a 53 year old man who underwent a laparoscopic cholecystectomy for symptomatic cholelithiasis.  He is doing well.  He was advised to continue to refrain from lifting anything heavier than 10 pounds for an additional week.  Otherwise, he may resume all of his usual activities.  We will see him on an as-needed basis.

## 2019-10-04 NOTE — Patient Instructions (Addendum)
Continue lifting restrictions for 1 more week.  Follow-up with our office as needed.  Please call and ask to speak with a nurse if you develop questions or concerns.   GENERAL POST-OPERATIVE PATIENT INSTRUCTIONS   WOUND CARE INSTRUCTIONS:  Keep a dry clean dressing on the wound if there is drainage. The initial bandage may be removed after 24 hours.  Once the wound has quit draining you may leave it open to air.  If clothing rubs against the wound or causes irritation and the wound is not draining you may cover it with a dry dressing during the daytime.  Try to keep the wound dry and avoid ointments on the wound unless directed to do so.  If the wound becomes bright red and painful or starts to drain infected material that is not clear, please contact your physician immediately.  If the wound is mildly pink and has a thick firm ridge underneath it, this is normal, and is referred to as a healing ridge.  This will resolve over the next 4-6 weeks.  BATHING: You may shower if you have been informed of this by your surgeon. However, Please do not submerge in a tub, hot tub, or pool until incisions are completely sealed or have been told by your surgeon that you may do so.  DIET:  You may eat any foods that you can tolerate.  It is a good idea to eat a high fiber diet and take in plenty of fluids to prevent constipation.  If you do become constipated you may want to take a mild laxative or take ducolax tablets on a daily basis until your bowel habits are regular.  Constipation can be very uncomfortable, along with straining, after recent surgery.  ACTIVITY:  You are encouraged to cough and deep breath or use your incentive spirometer if you were given one, every 15-30 minutes when awake.  This will help prevent respiratory complications and low grade fevers post-operatively if you had a general anesthetic.  You may want to hug a pillow when coughing and sneezing to add additional support to the surgical  area, if you had abdominal or chest surgery, which will decrease pain during these times.  You are encouraged to walk and engage in light activity for the next two weeks.  You should not lift more than 20 pounds for 6 weeks after surgery as it could put you at increased risk for complications.  Twenty pounds is roughly equivalent to a plastic bag of groceries. At that time- Listen to your body when lifting, if you have pain when lifting, stop and then try again in a few days. Soreness after doing exercises or activities of daily living is normal as you get back in to your normal routine.  MEDICATIONS:  Try to take narcotic medications and anti-inflammatory medications, such as tylenol, ibuprofen, naprosyn, etc., with food.  This will minimize stomach upset from the medication.  Should you develop nausea and vomiting from the pain medication, or develop a rash, please discontinue the medication and contact your physician.  You should not drive, make important decisions, or operate machinery when taking narcotic pain medication.  SUNBLOCK Use sun block to incision area over the next year if this area will be exposed to sun. This helps decrease scarring and will allow you avoid a permanent darkened area over your incision.  QUESTIONS:  Please feel free to call our office if you have any questions, and we will be glad to assist you.

## 2019-10-13 ENCOUNTER — Ambulatory Visit (INDEPENDENT_AMBULATORY_CARE_PROVIDER_SITE_OTHER): Payer: BC Managed Care – PPO | Admitting: Gastroenterology

## 2019-10-13 ENCOUNTER — Other Ambulatory Visit: Payer: Self-pay

## 2019-10-13 ENCOUNTER — Encounter: Payer: Self-pay | Admitting: Gastroenterology

## 2019-10-13 VITALS — BP 129/85 | HR 75 | Temp 98.5°F | Wt 248.4 lb

## 2019-10-13 DIAGNOSIS — K219 Gastro-esophageal reflux disease without esophagitis: Secondary | ICD-10-CM

## 2019-10-14 NOTE — Progress Notes (Signed)
Russell Antigua, MD [address]  [place]  [place], [place] [address]  Main: [phone]  Fax: [phone]   Primary Care Physician: Birdie Sons, MD   Chief Complaint  Patient presents with  . Gastroesophageal Reflux    Patient states symptoms have improved since he had his gallbladder removed     HPI: Russell Pineda is a 53 y.o. male here for follow-up of abdominal pain, bloating, nausea.  This is all resolved since his recent cholecystectomy.  Was taking Nexium due to heartburn and now this is well controlled so he switched it to every other day instead of daily.  No dysphagia.  No weight loss.  Underwent EGD and colonoscopy recently in September 2020.  Please see procedure report for details  Current Outpatient Medications  Medication Sig Dispense Refill  . allopurinol (ZYLOPRIM) 100 MG tablet TAKE 1 TABLET (100 MG TOTAL) BY MOUTH 2 (TWO) TIMES DAILY. 180 tablet 3  . cetirizine (ZYRTEC) 10 MG tablet Take 10 mg by mouth at bedtime.    . fexofenadine (ALLEGRA) 180 MG tablet Take 180 mg by mouth daily.    . fluocinonide cream (LIDEX) AB-123456789 % Apply 1 application topically as needed. 30 g 1  . indomethacin (INDOCIN) 50 MG capsule TAKE ONE CAPSULE BY MOUTH TWICE A DAY WITH MEAL AS NEEDED FOR GOUT 30 capsule 3  . psyllium (HYDROCIL/METAMUCIL) 95 % PACK Take 1 packet by mouth daily.     No current facility-administered medications for this visit.     Allergies as of 10/13/2019 - Review Complete 10/13/2019  Allergen Reaction Noted  . Niacin Other (See Comments) 05/08/2015    ROS:  General: Negative for anorexia, weight loss, fever, chills, fatigue, weakness. ENT: Negative for hoarseness, difficulty swallowing , nasal congestion. CV: Negative for chest pain, angina, palpitations, dyspnea on exertion, peripheral edema.  Respiratory: Negative for dyspnea at rest, dyspnea on exertion, cough, sputum, wheezing.  GI: See history of present illness. GU:  Negative  for dysuria, hematuria, urinary incontinence, urinary frequency, nocturnal urination.  Endo: Negative for unusual weight change.    Physical Examination:   BP 129/85 (BP Location: Left Arm, Patient Position: Sitting, Cuff Size: Normal)   Pulse 75   Temp 98.5 F (36.9 C) (Oral)   Wt 248 lb 6 oz (112.7 kg)   BMI 34.16 kg/m   General: Well-nourished, well-developed in no acute distress.  Eyes: No icterus. Conjunctivae pink. Mouth: Oropharyngeal mucosa moist and pink , no lesions erythema or exudate. Neck: Supple, Trachea midline Abdomen: Bowel sounds are normal, nontender, nondistended, no hepatosplenomegaly or masses, no abdominal bruits or hernia , no rebound or guarding.   Extremities: No lower extremity edema. No clubbing or deformities. Neuro: Alert and oriented x 3.  Grossly intact. Skin: Warm and dry, no jaundice.   Psych: Alert and cooperative, normal mood and affect.   Labs: CMP     Component Value Date/Time   NA 142 07/15/2019 0851   K 4.0 07/15/2019 0851   CL 106 07/15/2019 0851   CO2 20 07/15/2019 0851   GLUCOSE 108 (H) 07/15/2019 0851   GLUCOSE 102 (H) 09/30/2017 1018   BUN 15 07/15/2019 0851   CREATININE 0.92 07/15/2019 0851   CREATININE 0.83 09/30/2017 1018   CALCIUM 9.5 07/15/2019 0851   PROT 6.4 07/15/2019 0851   ALBUMIN 4.3 07/15/2019 0851   AST 32 07/15/2019 0851   ALT 36 07/15/2019 0851   ALKPHOS 40 07/15/2019 0851   BILITOT 0.5 07/15/2019  Tyrone 07/15/2019 0851   GFRNONAA 103 09/30/2017 1018   GFRAA 112 07/15/2019 0851   GFRAA 120 09/30/2017 1018   Lab Results  Component Value Date   WBC 8.7 04/22/2016   HGB 16.3 04/22/2016   HCT 46.3 04/22/2016   MCV 89 04/22/2016   PLT 263 04/22/2016    Imaging Studies: No results found.  Assessment and Plan:   Russell Pineda is a 53 y.o. y/o male here for follow-up of GERD, and nausea vomiting with abdominal discomfort  All his symptoms are now well controlled and resolved  Discontinue Nexium.  If symptoms return he was advised to call us  Patient educated extensively on acid reflux lifestyle modification, including buying a bed wedge, not eating 3 hrs before bedtime, diet modifications, and handout given for the same.   Nausea vomiting abdominal discomfort was likely related to his gallbladder given that post cholecystectomy he does not have any symptoms.  No postcholecystectomy diarrhea  Follow-up as needed    Dr Russell Pineda

## 2020-02-06 ENCOUNTER — Ambulatory Visit
Admission: RE | Admit: 2020-02-06 | Discharge: 2020-02-06 | Disposition: A | Discharge status: Home / Self Care | Payer: BC Managed Care – PPO | Source: Ambulatory Visit | Attending: Physician Assistant | Admitting: Physician Assistant

## 2020-02-06 ENCOUNTER — Ambulatory Visit (INDEPENDENT_AMBULATORY_CARE_PROVIDER_SITE_OTHER): Payer: BC Managed Care – PPO | Admitting: Physician Assistant

## 2020-02-06 ENCOUNTER — Ambulatory Visit
Admission: RE | Admit: 2020-02-06 | Discharge: 2020-02-06 | Disposition: A | Discharge status: Home / Self Care | Payer: BC Managed Care – PPO | Attending: Physician Assistant | Admitting: Physician Assistant

## 2020-02-06 ENCOUNTER — Other Ambulatory Visit: Payer: Self-pay

## 2020-02-06 ENCOUNTER — Encounter: Payer: Self-pay | Admitting: Physician Assistant

## 2020-02-06 VITALS — BP 125/81 | HR 68 | Temp 96.9°F | Resp 16 | Wt 259.0 lb

## 2020-02-06 DIAGNOSIS — W19XXXA Unspecified fall, initial encounter: Secondary | ICD-10-CM | POA: Insufficient documentation

## 2020-02-06 DIAGNOSIS — M25531 Pain in right wrist: Secondary | ICD-10-CM | POA: Diagnosis not present

## 2020-02-06 DIAGNOSIS — M79641 Pain in right hand: Secondary | ICD-10-CM | POA: Diagnosis not present

## 2020-02-06 DIAGNOSIS — S52611A Displaced fracture of right ulna styloid process, initial encounter for closed fracture: Secondary | ICD-10-CM | POA: Insufficient documentation

## 2020-02-06 DIAGNOSIS — S6991XA Unspecified injury of right wrist, hand and finger(s), initial encounter: Secondary | ICD-10-CM | POA: Diagnosis not present

## 2020-02-06 DIAGNOSIS — M7989 Other specified soft tissue disorders: Secondary | ICD-10-CM | POA: Diagnosis not present

## 2020-02-06 NOTE — Patient Instructions (Signed)
Metacarpal Fracture  A metacarpal fracture is a break (fracture) of a bone in the hand. Metacarpals are the bones that go from your knuckles to your wrist. You have five metacarpal bones in each hand. This fracture is usually caused by a fall or an injury that crushes the hand. This injury is diagnosed with medical history, a physical exam, or imaging tests, such as an X-ray. What are the signs or symptoms? Symptoms of this condition may include:  Pain.  Swelling.  Stiffness.  Bruising.  Inability to move a finger.  A finger that looks misshapen.  An abnormal bend or bump in the hand or finger (deformity). How is this treated? Treatment depends on how bad the injury is.  If your broken bone is still in place and did not move, you may need: ? To wear a splint or cast for several weeks. ? To have the broken finger taped to another finger next to it (buddy taping).  If the broken bone has pieces that moved and no longer line up, your doctor may: ? Do surgery to fix the bones into place with metal screws, plates, or wires. ? Move the bones back into position without surgery (closed reduction).  After your bones are put together, you will need to wear a splint or cast for several weeks. Treatment may also include:  Physical therapy after your cast or splint is removed.  Follow-up visits and X-rays to make sure you are healing. Follow these instructions at home: If you have a splint:  Wear the splint as told by your doctor. Remove it only as told by your doctor.  Loosen the splint if your fingers or toes tingle, become numb, or turn cold and blue.  Keep the splint clean.  If the splint is not waterproof: ? Do not let it get wet. ? Cover it with a watertight covering when you take a bath or a shower. If you have a cast:  Do not stick anything inside the cast to scratch your skin.  Check the skin around the cast every day. Tell your doctor about any concerns.  You may  put lotion on dry skin around the edges of the cast. Do not put lotion on the skin underneath the cast.  Keep the cast clean.  If the cast is not waterproof: ? Do not let it get wet. ? Cover it with a watertight covering when you take a bath or a shower. Activity  Do not lift or hold anything with your injured hand.  Return to your normal activities as told by your doctor. Ask your doctor what activities are safe for you.  Do exercises as told by your doctor. Driving  Do not drive or use heavy machinery while taking pain medicine.  Do not drive while wearing a cast or splint on a hand that you use for driving. Managing pain, stiffness, and swelling  If told, put ice on the injured area. Put ice only if you have a splint, not a cast. ? If you can remove your splint, remove it as told by your doctor. ? Put ice in a plastic bag. ? Place a towel between your skin and the bag. ? Leave the ice on for 20 minutes, 2-3 times a day.  Move your fingers often. This helps to prevent stiffness and swelling.  Raise the injured area above the level of your heart while you are sitting or lying down. General instructions  Do not put pressure on any part  of the cast or splint until it is fully hardened. This may take several hours.  Do not use any products that contain nicotine or tobacco, such as cigarettes and e-cigarettes. These can delay bone healing. If you need help quitting, ask your doctor.  Do not take baths, swim, or use a hot tub until your doctor says it is okay. Ask your doctor if you may take showers. You may only be allowed to take sponge baths.  Take over-the-counter and prescription medicines only as told by your doctor.  Keep all follow-up visits as told by your doctor. This is important. Contact a doctor if:  Your pain is worse.  You have redness, swelling, or pain that gets worse.  You have a fever.  There is a bad smell coming from your cast or splint. Get help  right away if:  You have very bad pain under the cast or in your hand.  You have trouble breathing.  The following happen, even after you loosen your splint: ? Your hand or fingernails turn blue or gray. ? Your hand feels cold or numb. Summary  A metacarpal fracture is a break (fracture) of a bone in the hand.  Treatment depends on how bad the injury is. You may need a cast or splint for a broken bone that did not move. You may need surgery for a very bad injury that moved the pieces of bone in your hand.  Follow your doctor's instructions for taking care of your injury after treatment. This information is not intended to replace advice given to you by your health care provider. Make sure you discuss any questions you have with your health care provider. Document Revised: 12/18/2017 Document Reviewed: 12/18/2017 Elsevier Patient Education  2020 [employer].

## 2020-02-06 NOTE — Progress Notes (Signed)
Patient: Russell Pineda Male    DOB: 08-31-1966   54 y.o.   MRN: [id number] Visit Date: 02/06/2020  Today's Provider: Trinna Post, PA-C   Chief Complaint  Patient presents with  . Fall   Subjective:    Patient fell onto outstretched right hand this past Friday. He is now having difficulty extending his fourth and fifth finger as well as pain and swelling. He is right hand dominant.   Fall The accident occurred 3 to 5 days ago. The fall occurred while walking (patient reports that he was walking down steps outside his home). He fell from a height of 1 to 2 ft. He landed on grass. There was no blood loss. The point of impact was the right shoulder, right elbow and right wrist. The pain is present in the right hand. The symptoms are aggravated by extension, flexion, movement, pressure on injury, rotation and use of injured limb. Pertinent negatives include no abdominal pain, bowel incontinence, fever, headaches, hearing loss, hematuria, loss of consciousness, nausea, numbness, tingling, visual change or vomiting. He has tried ice, NSAID and acetaminophen for the symptoms. The treatment provided no relief.    Allergies  Allergen Reactions  . Niacin Other (See Comments)    Flushing     Current Outpatient Medications:  .  allopurinol (ZYLOPRIM) 100 MG tablet, TAKE 1 TABLET (100 MG TOTAL) BY MOUTH 2 (TWO) TIMES DAILY., Disp: 180 tablet, Rfl: 3 .  cetirizine (ZYRTEC) 10 MG tablet, Take 10 mg by mouth at bedtime., Disp: , Rfl:  .  fexofenadine (ALLEGRA) 180 MG tablet, Take 180 mg by mouth daily., Disp: , Rfl:  .  psyllium (HYDROCIL/METAMUCIL) 95 % PACK, Take 1 packet by mouth daily., Disp: , Rfl:  .  fluocinonide cream (LIDEX) [id number] %, Apply 1 application topically as needed. (Patient not taking: Reported on 02/06/2020), Disp: 30 g, Rfl: 1 .  indomethacin (INDOCIN) 50 MG capsule, TAKE ONE CAPSULE BY MOUTH TWICE A DAY WITH MEAL AS NEEDED FOR GOUT (Patient not taking: Reported on  02/06/2020), Disp: 30 capsule, Rfl: 3  Review of Systems  Constitutional: Negative for fever.  Gastrointestinal: Negative for abdominal pain, bowel incontinence, nausea and vomiting.  Genitourinary: Negative for hematuria.  Neurological: Negative for tingling, loss of consciousness, numbness and headaches.    Social History   Tobacco Use  . Smoking status: Never Smoker  . Smokeless tobacco: Former Systems developer    Types: Chew  Substance Use Topics  . Alcohol use: Yes    Alcohol/week: 2.0 standard drinks    Types: 2 Cans of beer per week    Comment: occasional      Objective:   BP 125/81   Pulse 68   Temp (!) 96.9 F (36.1 C) (Oral)   Resp 16   Wt 259 lb (117.5 kg)   BMI 35.62 kg/m  Vitals:   02/06/20 1335  BP: 125/81  Pulse: 68  Resp: 16  Temp: (!) 96.9 F (36.1 C)  TempSrc: Oral  Weight: 259 lb (117.5 kg)  Body mass index is 35.62 kg/m.   Physical Exam Constitutional:      Appearance: Normal appearance.  Musculoskeletal:     Right hand: Swelling and tenderness present. No deformity. Decreased range of motion. Normal capillary refill. Normal pulse.     Left hand: Normal.     Comments: Tender to palpation over 4th metacarpal on right hand. Difficulty extending right 4th and 5th fingers.   Neurological:  Mental Status: He is alert.      No results found for any visits on 02/06/20.     Assessment & Plan    1. Right hand pain  Suspect a fracture, likely will need referral to hand surgeon. Instructed alternating tylenol and ibuprofen.   - DG Hand Complete Right; Future - DG Wrist Complete Right; Future  2. Fall, initial encounter  - DG Hand Complete Right; Future - DG Wrist Complete Right; Future  The entirety of the information documented in the History of Present Illness, Review of Systems and Physical Exam were personally obtained by me. Portions of this information were initially documented by Jennings Books, CMA and reviewed by me for thoroughness  and accuracy.   F/u PRN        Trinna Post, PA-C  [facility]

## 2020-02-07 ENCOUNTER — Telehealth: Payer: Self-pay

## 2020-02-07 ENCOUNTER — Encounter: Payer: Self-pay | Admitting: Physician Assistant

## 2020-02-07 DIAGNOSIS — S52614A Nondisplaced fracture of right ulna styloid process, initial encounter for closed fracture: Secondary | ICD-10-CM

## 2020-02-07 NOTE — Telephone Encounter (Signed)
Copied from [place] [phone]. Topic: Referral - Request for Referral >> Feb 07, 2020  1:41 PM Virl Axe D wrote: Has patient seen PCP for this complaint? Yes *If NO, is insurance requiring patient see PCP for this issue before PCP can refer them? Referral for which specialty: Ortho Preferred provider/office: In network Reason for referral:Hand injury  Pt requesting referral from [place] for his hand

## 2020-02-08 NOTE — Telephone Encounter (Signed)
Referral placed.

## 2020-02-08 NOTE — Telephone Encounter (Signed)
Patient was advised.  

## 2020-02-08 NOTE — Addendum Note (Signed)
Addended by: Trinna Post on: 02/08/2020 09:09 AM   Modules accepted: Orders

## 2020-02-09 DIAGNOSIS — M79641 Pain in right hand: Secondary | ICD-10-CM | POA: Diagnosis not present

## 2020-02-09 DIAGNOSIS — S6391XA Sprain of unspecified part of right wrist and hand, initial encounter: Secondary | ICD-10-CM | POA: Diagnosis not present

## 2020-02-23 ENCOUNTER — Telehealth: Payer: Self-pay

## 2020-02-23 NOTE — Telephone Encounter (Signed)
Copied from [place] [phone]. Topic: General - Other >> Feb 23, 2020  9:06 AM Jodie Echevaria wrote: Reason for CRM: Patient called to inform Dr Caryn Section that he is having some issues with sleeping at night, also complained of a pain in his back around his kidney area. Per patient he was placed on amitriptyline (ELAVIL) 25 MG tablet and that this dose worked well and he is hoping that Dr Caryn Section was able to do something for him again. Patient tried scheduling an appointment for today but nothing available till 02/24/20 but he is not available he would like a call back at Ph# [phone]

## 2020-02-24 ENCOUNTER — Ambulatory Visit: Payer: BC Managed Care – PPO | Admitting: Family Medicine

## 2020-02-24 MED ORDER — AMITRIPTYLINE HCL 25 MG PO TABS: 25.0000 mg | ORAL_TABLET | Freq: Every evening | ORAL | 2 refills | Status: DC | PRN

## 2020-02-24 NOTE — Telephone Encounter (Signed)
I don't understand what the amitriptyline has to do with the back back pain?  Is he wanting to get back on the amitriptyline? As far as his back is concerned would need to see him in the office to have him evaluated.

## 2020-02-24 NOTE — Telephone Encounter (Signed)
Patient is requesting Amitriptyline 25 mg RX for sleep. Pharmacy- [facility] [address] Patient advised he would need a OV for back pain. Appointment scheduled.

## 2020-02-27 ENCOUNTER — Other Ambulatory Visit: Payer: Self-pay

## 2020-02-27 ENCOUNTER — Ambulatory Visit (INDEPENDENT_AMBULATORY_CARE_PROVIDER_SITE_OTHER): Payer: BC Managed Care – PPO | Admitting: Family Medicine

## 2020-02-27 ENCOUNTER — Encounter: Payer: Self-pay | Admitting: Family Medicine

## 2020-02-27 VITALS — BP 113/74 | HR 80 | Temp 96.9°F | Wt 250.6 lb

## 2020-02-27 DIAGNOSIS — S29012A Strain of muscle and tendon of back wall of thorax, initial encounter: Secondary | ICD-10-CM

## 2020-02-27 MED ORDER — CYCLOBENZAPRINE HCL 5 MG PO TABS: 5.0000 mg | ORAL_TABLET | Freq: Three times a day (TID) | ORAL | 1 refills | Status: DC | PRN

## 2020-02-27 NOTE — Patient Instructions (Signed)

## 2020-02-27 NOTE — Progress Notes (Signed)
Established patient visit      Patient: Russell Pineda   DOB: 1966/11/19   54 y.o. Male  MRN: [id number] Visit Date: 02/27/2020  Today's healthcare provider: Vernie Murders, PA  Subjective:    Chief Complaint  Patient presents with  . Back Pain   Back Pain This is a new problem. Episode onset: a couple months. The problem occurs constantly. The problem has been gradually worsening since onset. The pain is present in the thoracic spine. The quality of the pain is described as aching (dull). The pain does not radiate. The symptoms are aggravated by sitting. Associated symptoms include numbness (calf area). Pertinent negatives include no leg pain, tingling or weakness. He has tried nothing for the symptoms.     Patient Active Problem List   Diagnosis Date Noted  . Cholelithiasis 08/02/2019  . Hepatic steatosis 08/02/2019  . Stomach irritation   . Abdominal cramps   . Gastric polyp   . Special screening for malignant neoplasms, colon   . Polyp of sigmoid colon   . Anxiety 08/18/2017  . Displacement of lumbar intervertebral disc without myelopathy 07/14/2017  . Hyperuricemia 06/12/2016  . Lipoma of abdominal wall 05/08/2015  . OSA (obstructive sleep apnea) 05/08/2015  . Allergic rhinitis 06/07/2007  . Esophageal reflux 06/07/2007  . Hyperlipemia, mixed 11/24/2006   Past Medical History:  Diagnosis Date  . Acute stress disorder   . Allergic rhinitis   . Esophageal reflux   . Fatty liver   . Gout 2016  . Hearing loss 2001  . Hypercholesterolemia   . Hyperlipidemia   . Lipoma of abdominal wall   . Lumbago   . Multiple benign nevi   . OSA (obstructive sleep apnea)    CPAP   Social History   Socioeconomic History  . Marital status: Married    Spouse name: Not on file  . Number of children: Not on file  . Years of education: HS Grad  . Highest education level: Not on file  Occupational History  . Occupation: Full-Time    Comment: [employer]  Tobacco Use    . Smoking status: Never Smoker  . Smokeless tobacco: Former Systems developer    Types: Chew  Substance and Sexual Activity  . Alcohol use: Yes    Alcohol/week: 2.0 standard drinks    Types: 2 Cans of beer per week    Comment: occasional  . Drug use: No  . Sexual activity: Yes  Other Topics Concern  . Not on file  Social History Narrative  . Not on file   Social Determinants of Health   Financial Resource Strain:   . Difficulty of Paying Living Expenses:   Food Insecurity:   . Worried About Charity fundraiser in the Last Year:   . Arboriculturist in the Last Year:   Transportation Needs:   . Film/video editor (Medical):   Marland Kitchen Lack of Transportation (Non-Medical):   Physical Activity:   . Days of Exercise per Week:   . Minutes of Exercise per Session:   Stress:   . Feeling of Stress :   Social Connections:   . Frequency of Communication with Friends and Family:   . Frequency of Social Gatherings with Friends and Family:   . Attends Religious Services:   . Active Member of Clubs or Organizations:   . Attends Archivist Meetings:   Marland Kitchen Marital Status:   Intimate Partner Violence:   . Fear of Current or  Ex-Partner:   . Emotionally Abused:   Marland Kitchen Physically Abused:   . Sexually Abused:    Family History  Problem Relation Age of Onset  . Emphysema Mother   . Heart disease Father   . Heart attack Father   . Cancer Maternal Grandmother        Bone  . Cancer Paternal Grandfather   . Alcohol abuse Paternal Uncle   . Alcohol abuse Maternal Grandfather    Allergies  Allergen Reactions  . Niacin Other (See Comments)    Flushing       Medications: Outpatient Medications Prior to Visit  Medication Sig  . allopurinol (ZYLOPRIM) 100 MG tablet TAKE 1 TABLET (100 MG TOTAL) BY MOUTH 2 (TWO) TIMES DAILY.  Marland Kitchen amitriptyline (ELAVIL) 25 MG tablet Take 1 tablet (25 mg total) by mouth at bedtime as needed.  . cetirizine (ZYRTEC) 10 MG tablet Take 10 mg by mouth at bedtime.  .  fexofenadine (ALLEGRA) 180 MG tablet Take 180 mg by mouth daily.  . psyllium (HYDROCIL/METAMUCIL) 95 % PACK Take 1 packet by mouth daily.  . [DISCONTINUED] fluocinonide cream (LIDEX) [id number] % Apply 1 application topically as needed. (Patient not taking: Reported on 02/06/2020)  . [DISCONTINUED] indomethacin (INDOCIN) 50 MG capsule TAKE ONE CAPSULE BY MOUTH TWICE A DAY WITH MEAL AS NEEDED FOR GOUT (Patient not taking: Reported on 02/06/2020)   No facility-administered medications prior to visit.    Review of Systems  Constitutional: Negative.   Respiratory: Negative.   Cardiovascular: Negative.   Musculoskeletal: Positive for back pain.  Neurological: Positive for numbness (calf area). Negative for tingling and weakness.        Objective:    BP 113/74 (BP Location: Right Arm, Patient Position: Sitting, Cuff Size: Large)   Pulse 80   Temp (!) 96.9 F (36.1 C) (Temporal)   Wt 250 lb 9.6 oz (113.7 kg)   BMI 34.46 kg/m    Physical Exam Constitutional:      General: He is not in acute distress.    Appearance: He is well-developed.  HENT:     Head: Normocephalic and atraumatic.     Right Ear: Hearing normal.     Left Ear: Hearing normal.     Nose: Nose normal.  Eyes:     General: Lids are normal. No scleral icterus.       Right eye: No discharge.        Left eye: No discharge.     Conjunctiva/sclera: Conjunctivae normal.  Cardiovascular:     Rate and Rhythm: Normal rate and regular rhythm.     Heart sounds: Normal heart sounds.  Pulmonary:     Effort: Pulmonary effort is normal. No respiratory distress.     Breath sounds: Normal breath sounds.     Comments: Well healed scars from laparoscopic cholecystectomy 09-12-19. No tenderness or masses. Abdominal:     General: Bowel sounds are normal.     Tenderness: There is no abdominal tenderness.  Musculoskeletal:        General: No tenderness. Normal range of motion.     Cervical back: Neck supple.  Skin:    Findings: No  lesion or rash.  Neurological:     Mental Status: He is alert and oriented to person, place, and time.  Psychiatric:        Speech: Speech normal.        Behavior: Behavior normal.        Thought Content: Thought content normal.  Assessment & Plan:    1. Upper back strain, initial encounter Developed upper mid back aching over the past several months without a specific injury. No radiation of pain. No tenderness to palpitation. No muscle weakness. SLR's 90 degrees without pain. 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 Discomfort worse with sitting and improves when he stands. Cholecystectomy 09-12-19 has reduced core strength. Given Flexeril 5 mg TID or 2 HS and rehab back exercises. Apply moist heat and recheck prn. Should not drive more than an hour without getting out to walk a few minutes. - cyclobenzaprine (FLEXERIL) 5 MG tablet; Take 1 tablet (5 mg total) by mouth 3 (three) times daily as needed for muscle spasms.  Dispense: 30 tablet; Refill: [place], [place] [phone] (phone) [phone] (fax)  [place]

## 2020-03-15 ENCOUNTER — Ambulatory Visit (INDEPENDENT_AMBULATORY_CARE_PROVIDER_SITE_OTHER): Payer: BC Managed Care – PPO | Admitting: Physician Assistant

## 2020-03-15 DIAGNOSIS — R0981 Nasal congestion: Secondary | ICD-10-CM | POA: Diagnosis not present

## 2020-03-15 MED ORDER — MONTELUKAST SODIUM 10 MG PO TABS: 10.0000 mg | ORAL_TABLET | Freq: Every day | ORAL | 3 refills | Status: DC

## 2020-03-15 MED ORDER — FLUTICASONE PROPIONATE 50 MCG/ACT NA SUSP: 2.0000 | Freq: Every day | NASAL | 6 refills | Status: DC

## 2020-03-15 NOTE — Progress Notes (Signed)
Virtual telephone visit    Virtual Visit via Telephone Note   This visit type was conducted due to national recommendations for restrictions regarding the COVID-19 Pandemic (e.g. social distancing) in an effort to limit this patient's exposure and mitigate transmission in our community. Due to his co-morbid illnesses, this patient is at least at moderate risk for complications without adequate follow up. This format is felt to be most appropriate for this patient at this time. The patient did not have access to video technology or had technical difficulties with video requiring transitioning to audio format only (telephone). Physical exam was limited to content and character of the telephone converstion.    Patient location: Home Provider location: Office   Patient: Russell Pineda   DOB: 1965/12/07   54 y.o. Male  MRN: [id number] Visit Date: 03/15/2020  Today's Provider: Trinna Post, PA-C  Subjective:    Chief Complaint  Patient presents with  . URI   HPI  Patient he reports nasal congestion x 1 week. Reports he is coughing slightly in the morning. He has been having nasal congestion. Denies trouble breathing. Denies fevers and chills. Denies nausea and vomiting. He is taking allegra daily. He has also started Claritin. This was not helpful. He is not using flonase. Denies facial pain. Denies having COVID. Denies vaccination against COVID. Reports his wife had similar symptoms last week.       Medications: Outpatient Medications Prior to Visit  Medication Sig  . allopurinol (ZYLOPRIM) 100 MG tablet TAKE 1 TABLET (100 MG TOTAL) BY MOUTH 2 (TWO) TIMES DAILY.  Marland Kitchen amitriptyline (ELAVIL) 25 MG tablet Take 1 tablet (25 mg total) by mouth at bedtime as needed.  . cetirizine (ZYRTEC) 10 MG tablet Take 10 mg by mouth at bedtime.  . cyclobenzaprine (FLEXERIL) 5 MG tablet Take 1 tablet (5 mg total) by mouth 3 (three) times daily as needed for muscle spasms.  . fexofenadine (ALLEGRA)  180 MG tablet Take 180 mg by mouth daily.  . psyllium (HYDROCIL/METAMUCIL) 95 % PACK Take 1 packet by mouth daily.   No facility-administered medications prior to visit.    Review of Systems      Objective:    There were no vitals taken for this visit.       Assessment & Plan:    1. Allergic Rhinits  - symptoms and exam c/w viral URI  - no evidence of strep pharyngitis, CAP, AOM, bacterial sinusitis, or other bacterial infection - concern for possible COVID19 infection - will send for outpatient testing - discussed need to quarantine 10 days from start of symptoms and until fever-free for at least 48 hours - discussed need to quarantine household members - discussed symptomatic management, natural course, and return precautions - sent in flonase refill, recommend he take this -may take sudafed  -sent singulair 10 mg daily   Return if symptoms worsen or fail to improve.    I discussed the assessment and treatment plan with the patient. The patient was provided an opportunity to ask questions and all were answered. The patient agreed with the plan and demonstrated an understanding of the instructions.   The patient was advised to call back or seek an in-person evaluation if the symptoms worsen or if the condition fails to improve as anticipated.  I provided 15 minutes of non-face-to-face time during this encounter.  ITrinna Post, PA-C, have reviewed all documentation for this visit. The documentation on 03/15/20 for the exam, diagnosis, procedures,  and orders are all accurate and complete.   Paulene Floor [facility] [phone] (phone) [phone] (fax)  [place]

## 2020-03-15 NOTE — Patient Instructions (Signed)
Allergic Rhinitis, Adult Allergic rhinitis is a reaction to allergens in the air. Allergens are tiny specks (particles) in the air that cause your body to have an allergic reaction. This condition cannot be passed from person to person (is not contagious). Allergic rhinitis cannot be cured, but it can be controlled. There are two types of allergic rhinitis:  Seasonal. This type is also called hay fever. It happens only during certain times of the year.  Perennial. This type can happen at any time of the year. What are the causes? This condition may be caused by:  Pollen from grasses, trees, and weeds.  House dust mites.  Pet dander.  Mold. What are the signs or symptoms? Symptoms of this condition include:  Sneezing.  Runny or stuffy nose (nasal congestion).  A lot of mucus in the back of the throat (postnasal drip).  Itchy nose.  Tearing of the eyes.  Trouble sleeping.  Being sleepy during day. How is this treated? There is no cure for this condition. You should avoid things that trigger your symptoms (allergens). Treatment can help to relieve symptoms. This may include:  Medicines that block allergy symptoms, such as antihistamines. These may be given as a shot, nasal spray, or pill.  Shots that are given until your body becomes less sensitive to the allergen (desensitization).  Stronger medicines, if all other treatments have not worked. Follow these instructions at home: Avoiding allergens   Find out what you are allergic to. Common allergens include smoke, dust, and pollen.  Avoid them if you can. These are some of the things that you can do to avoid allergens: ? Replace carpet with wood, tile, or vinyl flooring. Carpet can trap dander and dust. ? Clean any mold found in the home. ? Do not smoke. Do not allow smoking in your home. ? Change your heating and air conditioning filter at least once a month. ? During allergy season:  Keep windows closed as much as  you can. If possible, use air conditioning when there is a lot of pollen in the air.  Use a special filter for allergies with your furnace and air conditioner.  Plan outdoor activities when pollen counts are lowest. This is usually during the early morning or evening hours.  If you do go outdoors when pollen count is high, wear a special mask for people with allergies.  When you come indoors, take a shower and change your clothes before sitting on furniture or bedding. General instructions  Do not use fans in your home.  Do not hang clothes outside to dry.  Wear sunglasses to keep pollen out of your eyes.  Wash your hands right away after you touch household pets.  Take over-the-counter and prescription medicines only as told by your doctor.  Keep all follow-up visits as told by your doctor. This is important. Contact a doctor if:  You have a fever.  You have a cough that does not go away (is persistent).  You start to make whistling sounds when you breathe (wheeze).  Your symptoms do not get better with treatment.  You have thick fluid coming from your nose.  You start to have nosebleeds. Get help right away if:  Your tongue or your lips are swollen.  You have trouble breathing.  You feel dizzy or you feel like you are going to pass out (faint).  You have cold sweats. Summary  Allergic rhinitis is a reaction to allergens in the air.  This condition may be   caused by allergens. These include pollen, dust mites, pet dander, and mold.  Symptoms include a runny, itchy nose, sneezing, or tearing eyes. You may also have trouble sleeping or feel sleepy during the day.  Treatment includes taking medicines and avoiding allergens. You may also get shots or take stronger medicines.  Get help if you have a fever or a cough that does not stop. Get help right away if you are short of breath. This information is not intended to replace advice given to you by your health care  provider. Make sure you discuss any questions you have with your health care provider. Document Revised: 03/01/2019 Document Reviewed: 06/01/2018 Elsevier Patient Education  2020 Elsevier Inc.  

## 2020-03-18 ENCOUNTER — Other Ambulatory Visit: Payer: Self-pay | Admitting: Family Medicine

## 2020-03-18 NOTE — Telephone Encounter (Signed)
Requested Prescriptions  Pending Prescriptions Disp Refills  . amitriptyline (ELAVIL) 25 MG tablet [Pharmacy Med Name: AMITRIPTYLINE HCL 25 MG TAB] 90 tablet 1    Sig: TAKE 1 TABLET (25 MG TOTAL) BY MOUTH AT BEDTIME AS NEEDED.     Psychiatry:  Antidepressants - Heterocyclics (TCAs) Passed - 03/18/2020  1:32 PM      Passed - Valid encounter within last 6 months    Recent Outpatient Visits          3 days ago allergic rhinitis   [place], Wendee [facility], [place]   2 weeks ago Upper back strain, initial encounter   [facility], Elcho E, [place]   1 month ago Right hand pain   [place], [place]   6 months ago Acute pain of right shoulder   [facility] Birdie Sons, [place]   7 months ago RUQ pain   [facility] [place], Dionne Bucy, [place]

## 2020-04-09 DIAGNOSIS — M5387 Other specified dorsopathies, lumbosacral region: Secondary | ICD-10-CM | POA: Diagnosis not present

## 2020-04-09 DIAGNOSIS — M9903 Segmental and somatic dysfunction of lumbar region: Secondary | ICD-10-CM | POA: Diagnosis not present

## 2020-04-09 DIAGNOSIS — M9904 Segmental and somatic dysfunction of sacral region: Secondary | ICD-10-CM | POA: Diagnosis not present

## 2020-04-09 DIAGNOSIS — M9902 Segmental and somatic dysfunction of thoracic region: Secondary | ICD-10-CM | POA: Diagnosis not present

## 2020-04-18 DIAGNOSIS — M5387 Other specified dorsopathies, lumbosacral region: Secondary | ICD-10-CM | POA: Diagnosis not present

## 2020-04-18 DIAGNOSIS — M9904 Segmental and somatic dysfunction of sacral region: Secondary | ICD-10-CM | POA: Diagnosis not present

## 2020-04-18 DIAGNOSIS — M9903 Segmental and somatic dysfunction of lumbar region: Secondary | ICD-10-CM | POA: Diagnosis not present

## 2020-04-18 DIAGNOSIS — M9902 Segmental and somatic dysfunction of thoracic region: Secondary | ICD-10-CM | POA: Diagnosis not present

## 2020-06-01 ENCOUNTER — Other Ambulatory Visit: Payer: Self-pay | Admitting: Physician Assistant

## 2020-06-01 DIAGNOSIS — R0981 Nasal congestion: Secondary | ICD-10-CM

## 2020-07-08 IMAGING — US US ABDOMEN COMPLETE
1 series · 13 of 25 positions shown · non-contrast
Comparison: None.

CLINICAL DATA: Abdominal pain

EXAM:
ABDOMEN ULTRASOUND COMPLETE

[Series 1: us abdomen complete · 0.28mm/px · 13 of 75 slices shown]
[im 1/75]
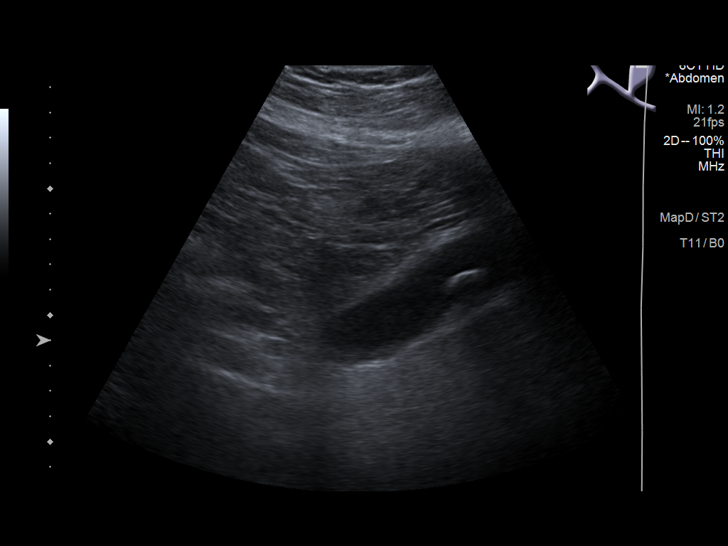
[im 7/75]
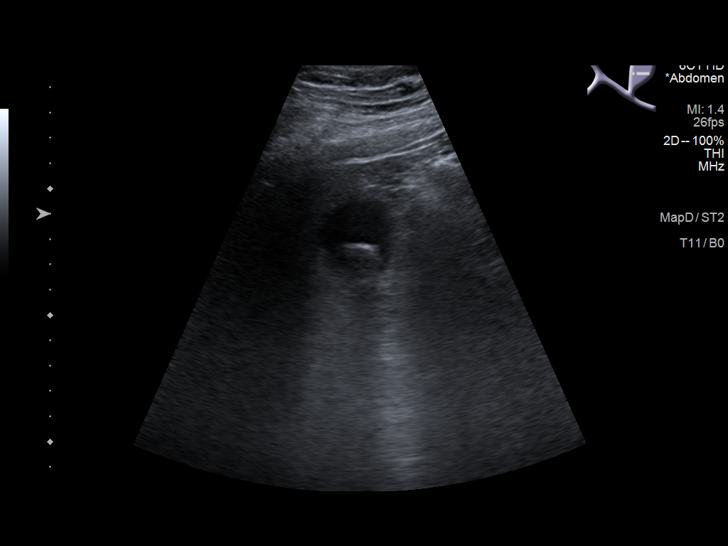
[im 13/75]
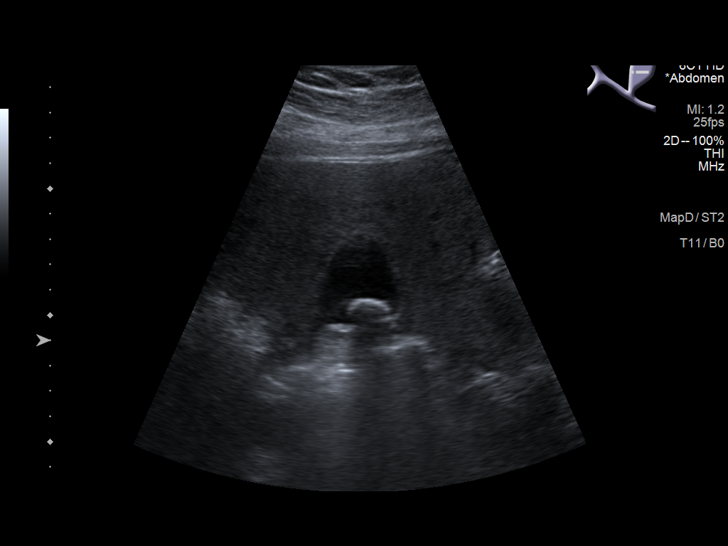
[im 19/75]
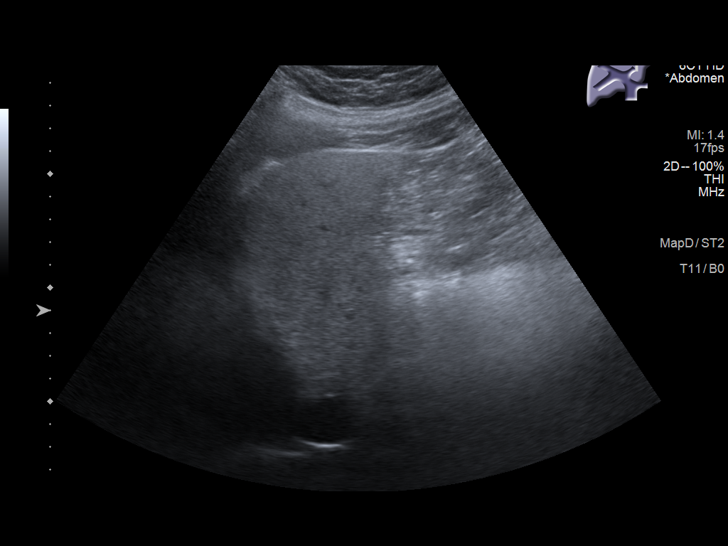
[im 25/75]
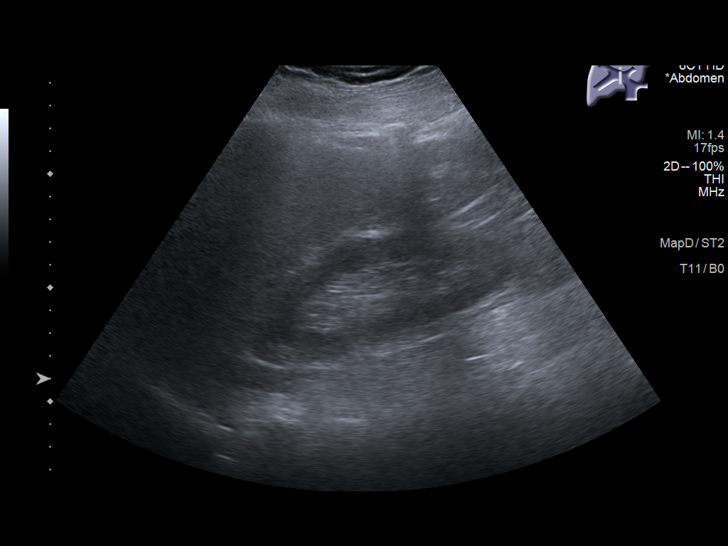
[im 31/75]
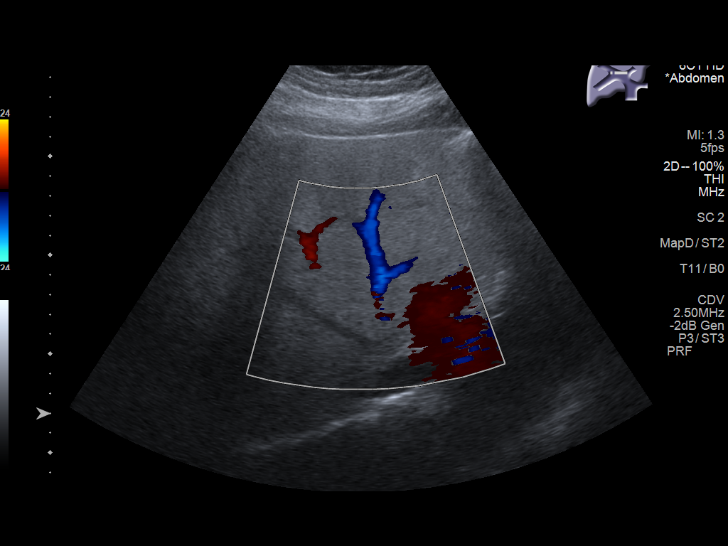
[im 38/75]
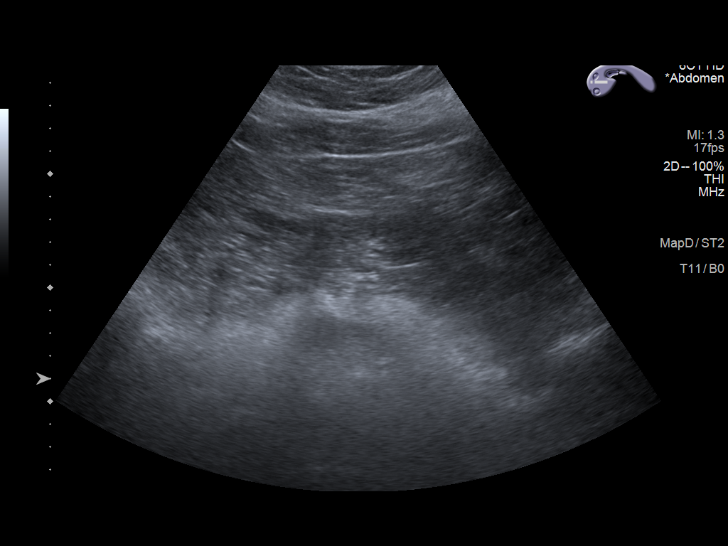
[im 44/75]
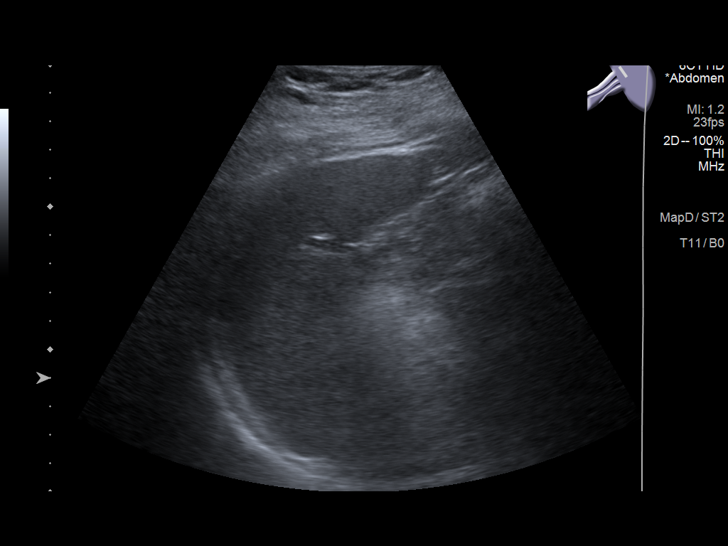
[im 50/75]
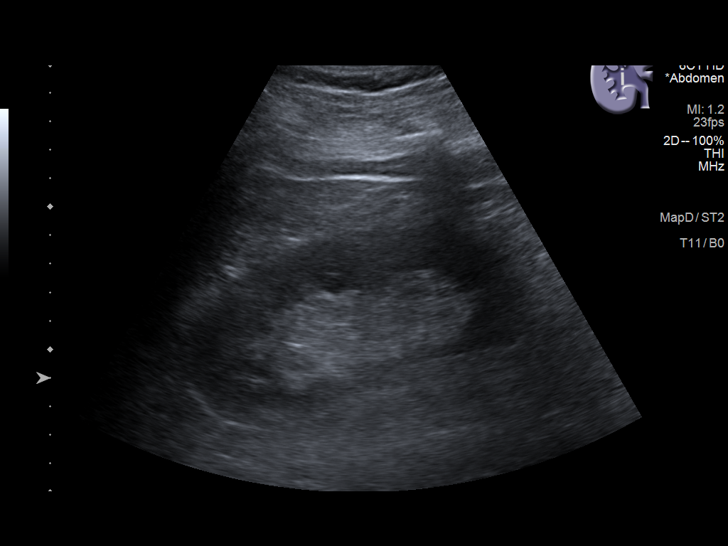
[im 56/75]
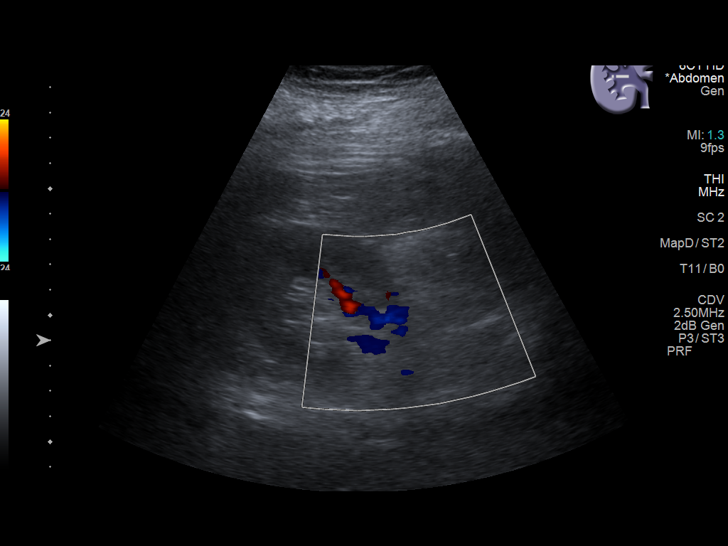
[im 62/75]
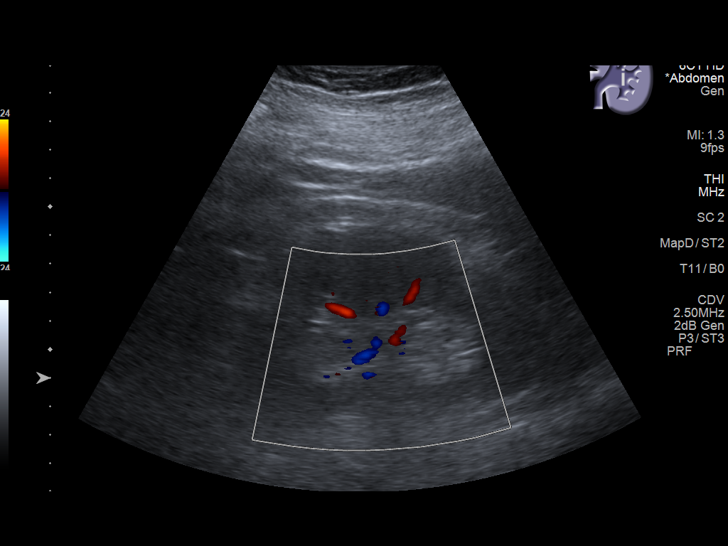
[im 68/75]
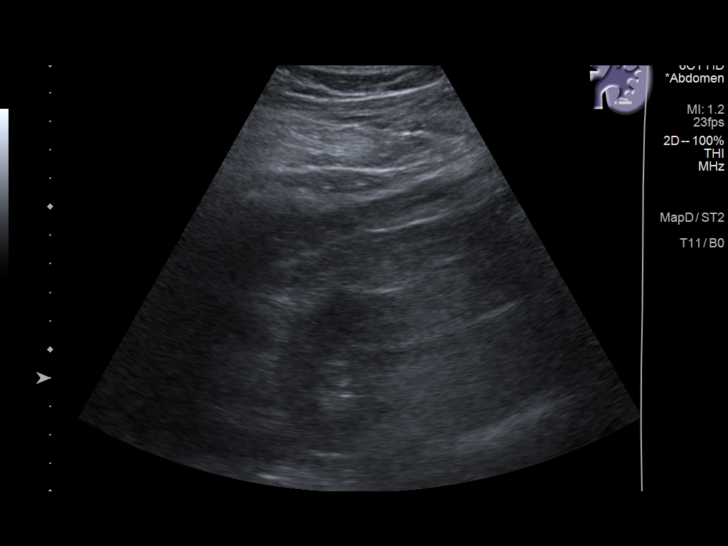
[im 75/75]
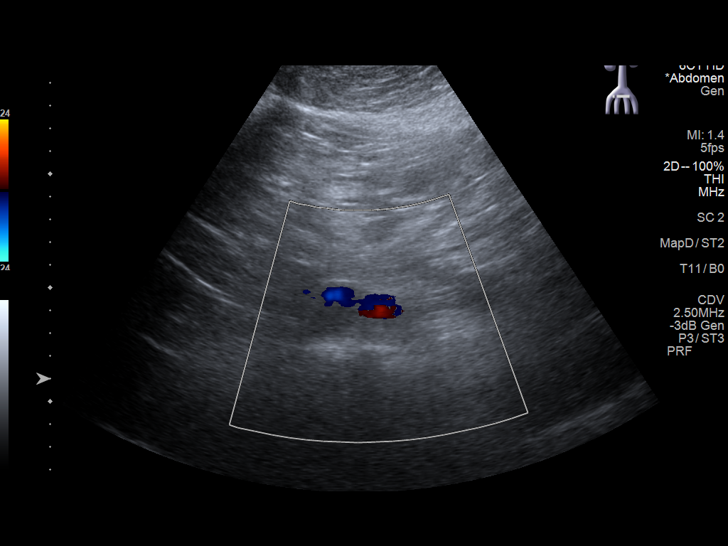

[13 of 25 positions shown; findings below may reference images not displayed]

FINDINGS: Gallbladder: Within the gallbladder, there are echogenic foci which
move and shadow consistent with cholelithiasis. Largest gallstone
measures 1.7 cm in length. No gallbladder wall thickening or
pericholecystic fluid. No sonographic Murphy sign noted by
sonographer.

Common bile duct: Diameter: 8 mm proximally, prominent. The distal
common bile duct is obscured by gas. There is no intrahepatic
biliary duct dilatation.

Liver: No focal lesion identified. Liver echogenicity overall is
increased. Portal vein is patent on color Doppler imaging with
normal direction of blood flow towards the liver.

IVC: No abnormality visualized.

Pancreas: Visualized portion unremarkable. Portions of pancreas
obscured by gas.

Spleen: Size and appearance within normal limits.

Right Kidney: Length: 12.0 cm. Echogenicity within normal limits. No
mass or hydronephrosis visualized.

Left Kidney: Length: 12.6 cm. Echogenicity within normal limits. No
mass or hydronephrosis visualized.

Abdominal aorta: No aneurysm visualized.

Other findings: No demonstrable ascites.
IMPRESSION: 1. Cholelithiasis. No gallbladder wall thickening or pericholecystic
fluid.

2. Prominent proximal common bile duct. No obstructing lesion
identified. Note, however, that portions of the common bile duct are
obscured by gas. From an imaging standpoint, MRCP would be the
imaging study of choice to optimally visualize the biliary ductal
system.

3. Increase in liver echogenicity, a finding indicative of hepatic
steatosis. While no focal liver lesions are evident on this study,
it must be cautioned that the sensitivity of ultrasound for
detection of focal liver lesions is diminished in this circumstance.

4. Portions of pancreas obscured by gas. Visualized portions of
pancreas appear normal.

5.  Study otherwise unremarkable.

## 2020-07-13 ENCOUNTER — Other Ambulatory Visit: Payer: Self-pay | Admitting: Family Medicine

## 2020-07-28 IMAGING — CR DG RIBS 2V*R*
1 series · 4 of 4 positions shown · non-contrast
Comparison: None.

CLINICAL DATA: Fall from ladder yesterday with persistent rib pain,
initial encounter

EXAM:
RIGHT RIBS - 2 VIEW

[Series 1: dg ribs unilateral right · 0.14mm/px · 4 of 4 slices shown]
[im 1/4]
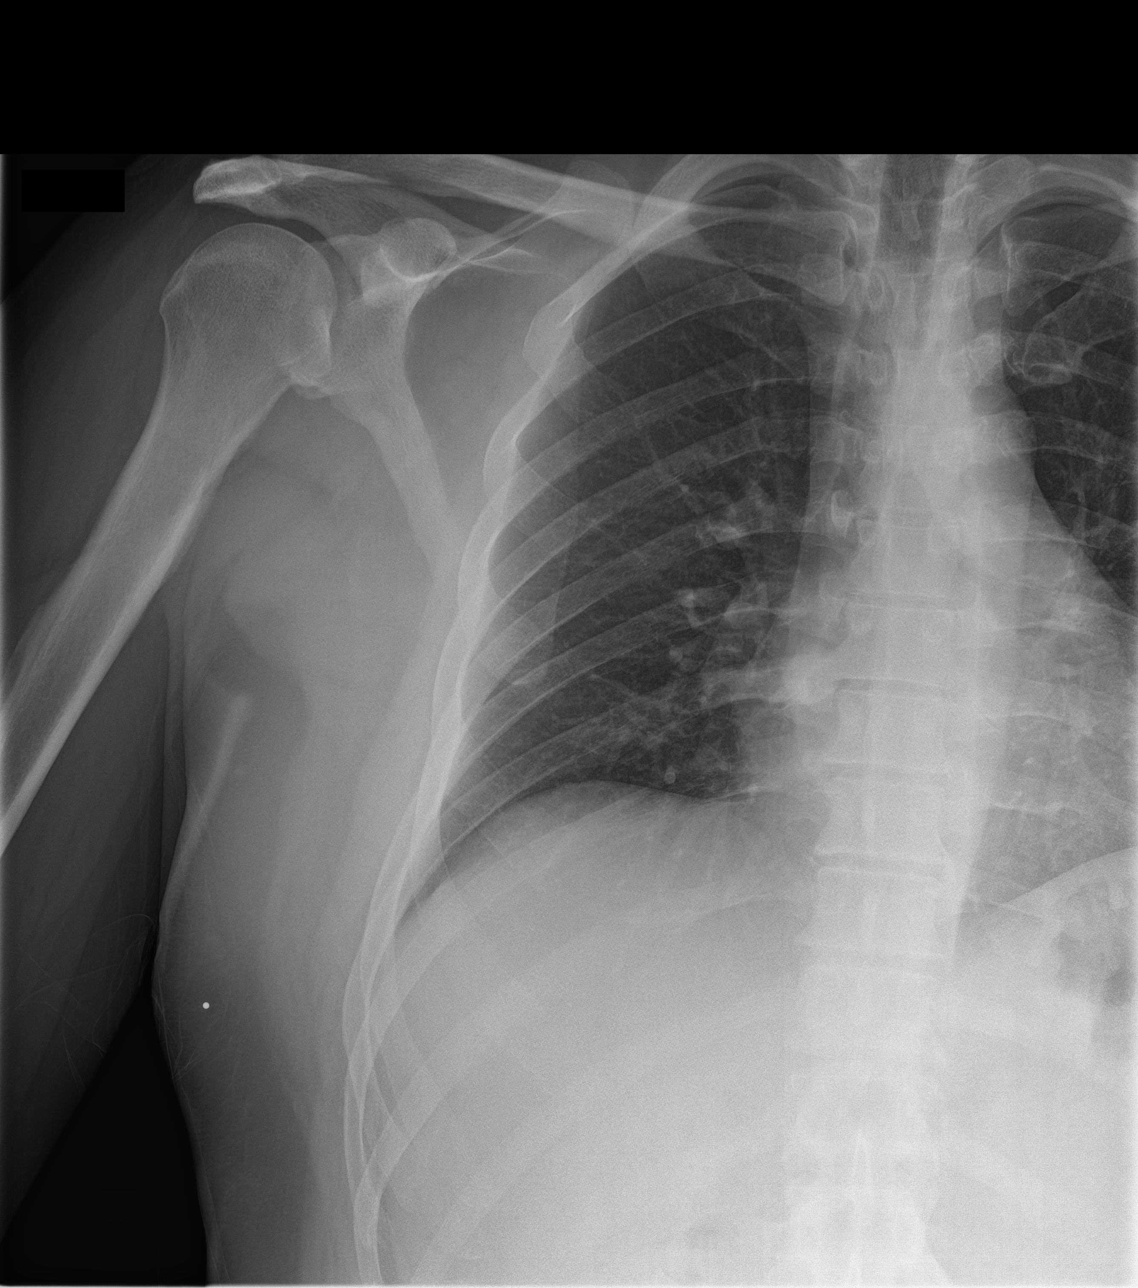
[im 2/4]
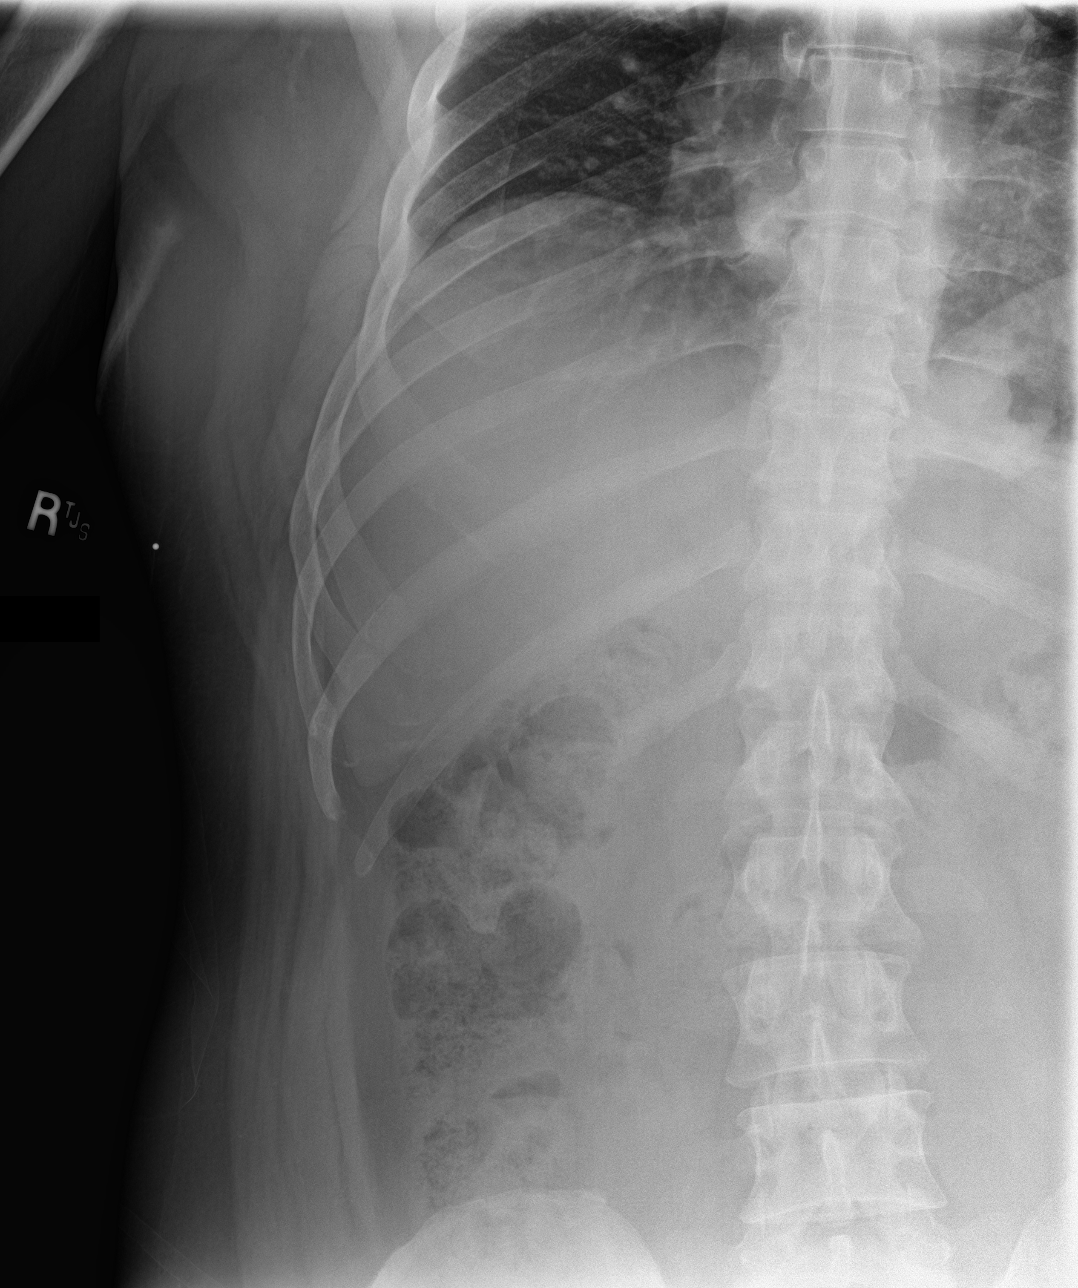
[im 3/4]
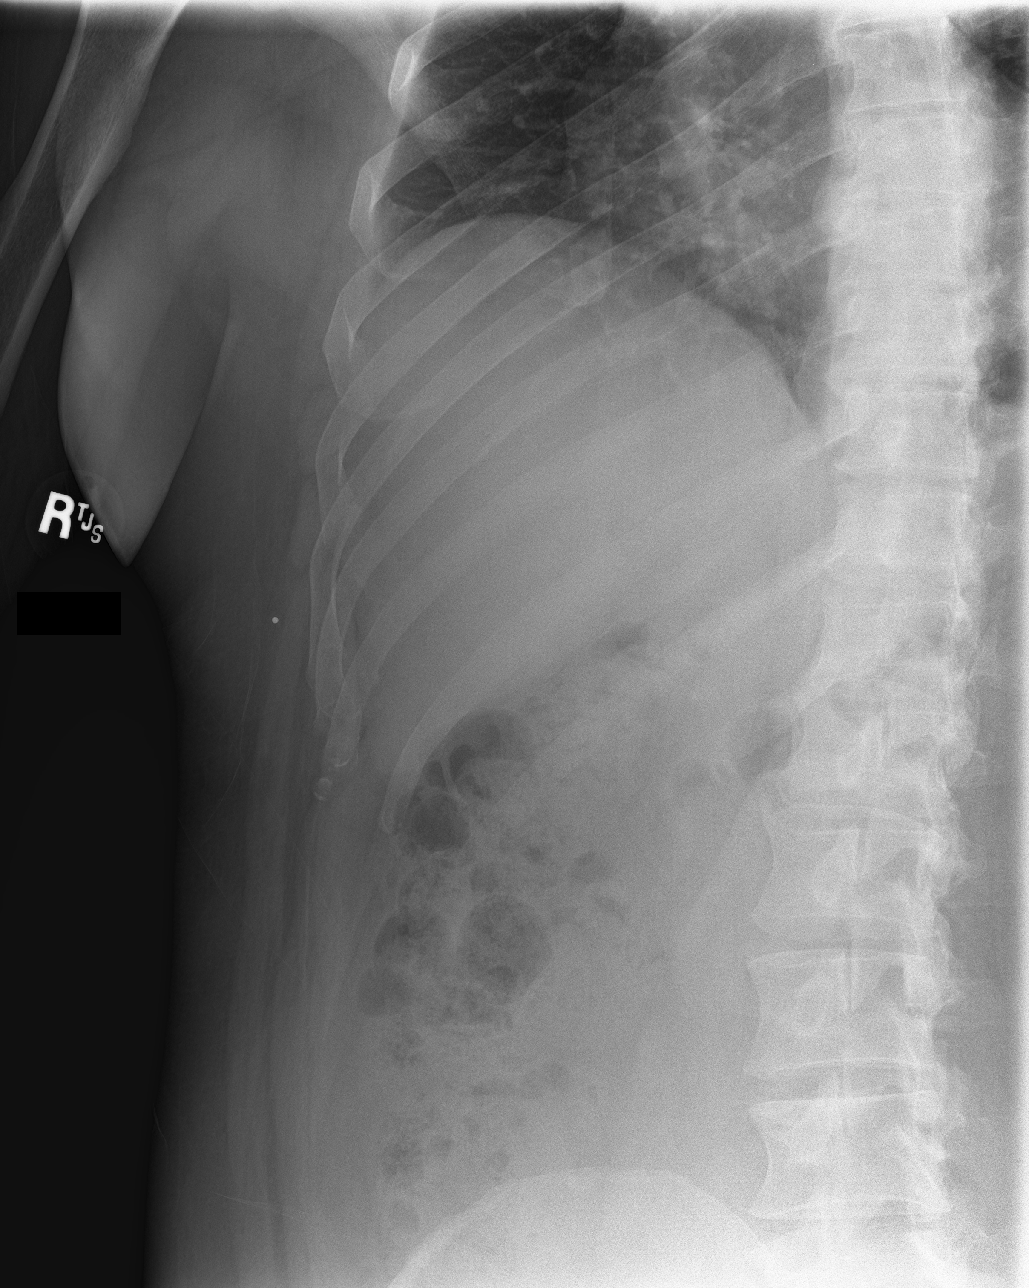
[im 4/4]
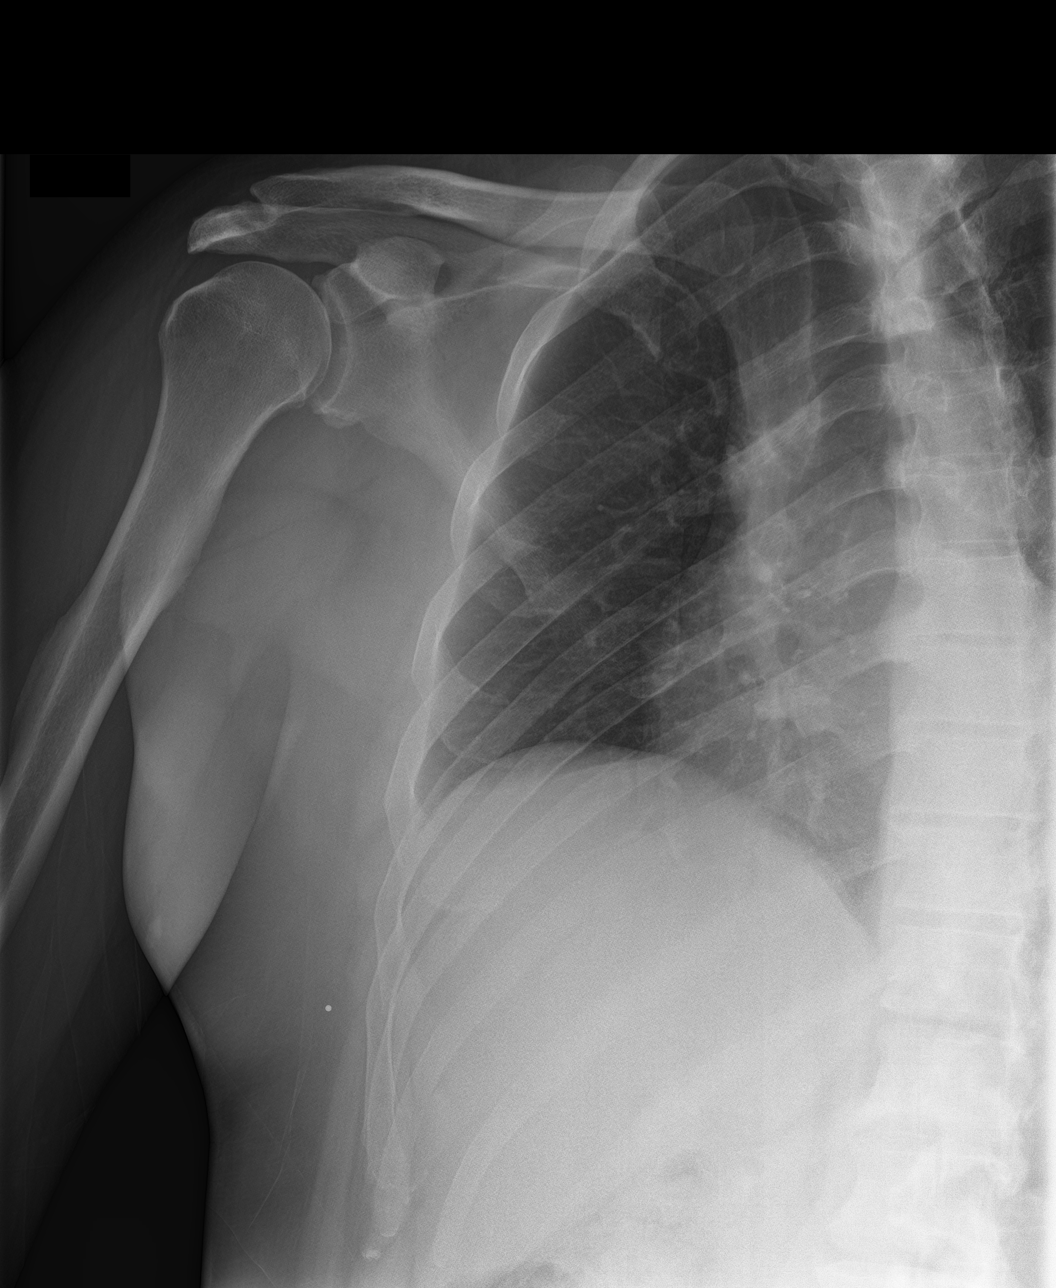

[4 of 4 positions shown; findings below may reference images not displayed]

FINDINGS: No fracture or other bone lesions are seen involving the ribs.
IMPRESSION: No acute rib fracture noted.  No pneumothorax is seen.

## 2020-09-14 ENCOUNTER — Other Ambulatory Visit: Payer: Self-pay | Admitting: Family Medicine

## 2020-09-14 NOTE — Telephone Encounter (Signed)
Requested medications are due for refill today yes  Requested medications are on the active medication list yes  Last refill 7/27  Last visit over one year ago  Notes to clinic Failed protocol of visit within 6 months

## 2020-10-15 ENCOUNTER — Telehealth: Payer: Self-pay | Admitting: *Deleted

## 2020-10-15 DIAGNOSIS — H9313 Tinnitus, bilateral: Secondary | ICD-10-CM

## 2020-10-15 NOTE — Telephone Encounter (Signed)
Copied from [place] [id number]. Topic: Appointment Scheduling - Scheduling Inquiry for Clinic >> Oct 15, 2020  8:11 AM Russell Pineda wrote: Reason for CRM: Pt needs a CPE before the end of year for insurance and work / Dr. Maralyn Sago next appt was towards the end of January / please advise

## 2020-10-15 NOTE — Telephone Encounter (Signed)
There is a CPE open at [facility] on Monday the 6th and a 2:20 slot on Friday the 10th.

## 2020-10-15 NOTE — Telephone Encounter (Signed)
Please review. Did you want to work the patient in?

## 2020-10-15 NOTE — Telephone Encounter (Signed)
Copied from [place] [id number]. Topic: Referral - Request for Referral >> Oct 15, 2020  8:12 AM Russell Pineda wrote: Has patient seen PCP for this complaint? Yes  *If NO, is insurance requiring patient see PCP for this issue before PCP can refer them? 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 Please call Pt when referral is placed

## 2020-10-29 ENCOUNTER — Ambulatory Visit (INDEPENDENT_AMBULATORY_CARE_PROVIDER_SITE_OTHER): Payer: BC Managed Care – PPO | Admitting: Family Medicine

## 2020-10-29 ENCOUNTER — Other Ambulatory Visit: Payer: Self-pay

## 2020-10-29 ENCOUNTER — Encounter: Payer: Self-pay | Admitting: Family Medicine

## 2020-10-29 VITALS — BP 136/90 | HR 67 | Temp 98.2°F | Resp 16 | Ht 72.0 in | Wt 245.0 lb

## 2020-10-29 DIAGNOSIS — E790 Hyperuricemia without signs of inflammatory arthritis and tophaceous disease: Secondary | ICD-10-CM

## 2020-10-29 DIAGNOSIS — Z125 Encounter for screening for malignant neoplasm of prostate: Secondary | ICD-10-CM

## 2020-10-29 DIAGNOSIS — Z1159 Encounter for screening for other viral diseases: Secondary | ICD-10-CM | POA: Diagnosis not present

## 2020-10-29 DIAGNOSIS — W57XXXA Bitten or stung by nonvenomous insect and other nonvenomous arthropods, initial encounter: Secondary | ICD-10-CM

## 2020-10-29 DIAGNOSIS — Z23 Encounter for immunization: Secondary | ICD-10-CM | POA: Diagnosis not present

## 2020-10-29 DIAGNOSIS — E782 Mixed hyperlipidemia: Secondary | ICD-10-CM

## 2020-10-29 DIAGNOSIS — Z0000 Encounter for general adult medical examination without abnormal findings: Secondary | ICD-10-CM | POA: Diagnosis not present

## 2020-10-29 DIAGNOSIS — R5383 Other fatigue: Secondary | ICD-10-CM

## 2020-10-29 NOTE — Progress Notes (Signed)
Complete physical exam   Patient: Russell Pineda   DOB: Jun 02, 1966   54 y.o. Male  MRN: [id number] Visit Date: 10/29/2020  Today's healthcare provider: Lelon Huh, MD   Chief Complaint  Patient presents with  . Annual Exam  . Hyperlipidemia   Subjective    Russell Pineda is a 54 y.o. male who presents today for a complete physical exam.  He reports consuming a general diet. The patient does not participate in regular exercise at present. He generally feels fairly well. He reports sleeping well. He does have additional problems to discuss today.  HPI  Lipid/Cholesterol, Follow-up  Last lipid panel Other pertinent labs  Lab Results  Component Value Date   CHOL 216 (H) 07/15/2019   HDL 49 07/15/2019   LDLCALC 126 (H) 07/15/2019   TRIG 205 (H) 07/15/2019   CHOLHDL 4.4 07/15/2019   Lab Results  Component Value Date   ALT 36 07/15/2019   AST 32 07/15/2019   PLT 263 04/22/2016   TSH 2.51 09/30/2017     He was last seen for this 2 years ago.  Management since that visit includes continue healthy diet.  He reports good compliance with treatment. He is not having side effects.   Symptoms: No chest pain No chest pressure/discomfort  No dyspnea No lower extremity edema  No numbness or tingling of extremity No orthopnea  No palpitations No paroxysmal nocturnal dyspnea  No speech difficulty No syncope   Current diet: well balanced Current exercise: none  The 10-year ASCVD risk score Mikey Bussing DC Jr., et al., 2013) is: 5.3%  ---------------------------------------------------------------------------------------------------  Follow up for Gout:  The patient was last seen for this more than 1 year ago. Current treatment includes Allopurinol 100mg  twice daily.  He reports good compliance with treatment. He feels that condition is controlled.  He does have brief minor flares if he eats high protein foods.  He is not having side effects.    ----------------------------------------------------------------------------------------- He also states he had several tick bits over the summer and fall and has been feeling very fatigued. His dogged had positive ehrlichiosis serology about a month ago and patient would like to be tested. No fevers or unusual joint swelling or aches.   Past Medical History:  Diagnosis Date  . Acute stress disorder   . Allergic rhinitis   . Esophageal reflux   . Fatty liver   . Gout 2016  . Hearing loss 2001  . Hypercholesterolemia   . Hyperlipidemia   . Lipoma of abdominal wall   . Lumbago   . Multiple benign nevi   . OSA (obstructive sleep apnea)    CPAP   Past Surgical History:  Procedure Laterality Date  . CHOLECYSTECTOMY N/A 09/12/2019   Procedure: LAPAROSCOPIC CHOLECYSTECTOMY;  Surgeon: Fredirick Maudlin, MD;  Location: [facility] ORS;  Service: General;  Laterality: N/A;  . COLONOSCOPY WITH PROPOFOL N/A 07/27/2019   Procedure: COLONOSCOPY WITH PROPOFOL;  Surgeon: Virgel Manifold, MD;  Location: [facility] ENDOSCOPY;  Service: Endoscopy;  Laterality: N/A;  . ESOPHAGOGASTRODUODENOSCOPY (EGD) WITH PROPOFOL N/A 07/27/2019   Procedure: ESOPHAGOGASTRODUODENOSCOPY (EGD) WITH PROPOFOL;  Surgeon: Virgel Manifold, MD;  Location: [facility] ENDOSCOPY;  Service: Endoscopy;  Laterality: N/A;  . HERNIA REPAIR    . orchidectomy     left  . TONSILLECTOMY    . WISDOM TOOTH EXTRACTION     Social History   Socioeconomic History  . Marital status: Married    Spouse name: Not on file  .  Number of children: Not on file  . Years of education: HS Grad  . Highest education level: Not on file  Occupational History  . Occupation: Full-Time    Comment: [employer]  Tobacco Use  . Smoking status: Never Smoker  . Smokeless tobacco: Former Systems developer    Types: Secondary school teacher  . Vaping Use: Never used  Substance and Sexual Activity  . Alcohol use: Yes    Alcohol/week: 2.0 standard drinks    Types: 2 Cans of beer  per week    Comment: occasional  . Drug use: No  . Sexual activity: Yes  Other Topics Concern  . Not on file  Social History Narrative  . Not on file   Social Determinants of Health   Financial Resource Strain:   . Difficulty of Paying Living Expenses: Not on file  Food Insecurity:   . Worried About Charity fundraiser in the Last Year: Not on file  . Ran Out of Food in the Last Year: Not on file  Transportation Needs:   . Lack of Transportation (Medical): Not on file  . Lack of Transportation (Non-Medical): Not on file  Physical Activity:   . Days of Exercise per Week: Not on file  . Minutes of Exercise per Session: Not on file  Stress:   . Feeling of Stress : Not on file  Social Connections:   . Frequency of Communication with Friends and Family: Not on file  . Frequency of Social Gatherings with Friends and Family: Not on file  . Attends Religious Services: Not on file  . Active Member of Clubs or Organizations: Not on file  . Attends Archivist Meetings: Not on file  . Marital Status: Not on file  Intimate Partner Violence:   . Fear of Current or Ex-Partner: Not on file  . Emotionally Abused: Not on file  . Physically Abused: Not on file  . Sexually Abused: Not on file   Family Status  Relation Name Status  . Mother  Deceased at age 74       Emphysema  . Father  Deceased at age 36       MI  . MGM  Deceased  . PGF  Deceased  . Sister  Alive  . Brother  Alive  . Sister  Alive  . Sister  Alive  . Annamarie Major  (Not Specified)  . MGF  (Not Specified)   Family History  Problem Relation Age of Onset  . Emphysema Mother   . Heart disease Father   . Heart attack Father   . Cancer Maternal Grandmother        Bone  . Cancer Paternal Grandfather   . Alcohol abuse Paternal Uncle   . Alcohol abuse Maternal Grandfather    Allergies  Allergen Reactions  . Niacin Other (See Comments)    Flushing    Patient Care Team: Birdie Sons, MD as PCP -  General (Family Medicine)   Medications: Outpatient Medications Prior to Visit  Medication Sig  . allopurinol (ZYLOPRIM) 100 MG tablet TAKE 1 TABLET (100 MG TOTAL) BY MOUTH 2 (TWO) TIMES DAILY.  Marland Kitchen amitriptyline (ELAVIL) 25 MG tablet TAKE 1 TABLET (25 MG TOTAL) BY MOUTH AT BEDTIME AS NEEDED.  Marland Kitchen cetirizine (ZYRTEC) 10 MG tablet Take 10 mg by mouth at bedtime.  . fexofenadine (ALLEGRA) 180 MG tablet Take 180 mg by mouth daily.  . fluticasone (FLONASE) 50 MCG/ACT nasal spray Place 2 sprays into both  nostrils daily.  . psyllium (HYDROCIL/METAMUCIL) 95 % PACK Take 1 packet by mouth daily.  . [DISCONTINUED] cyclobenzaprine (FLEXERIL) 5 MG tablet Take 1 tablet (5 mg total) by mouth 3 (three) times daily as needed for muscle spasms. (Patient not taking: Reported on 10/29/2020)  . [DISCONTINUED] montelukast (SINGULAIR) 10 MG tablet TAKE 1 TABLET BY MOUTH EVERYDAY AT BEDTIME (Patient not taking: Reported on 10/29/2020)   No facility-administered medications prior to visit.    Review of Systems  Constitutional: Negative for appetite change, chills, fatigue and fever.  HENT: Negative for congestion, ear pain, hearing loss, nosebleeds and trouble swallowing.   Eyes: Negative for pain and visual disturbance.  Respiratory: Negative for cough, chest tightness and shortness of breath.   Cardiovascular: Negative for chest pain, palpitations and leg swelling.  Gastrointestinal: Negative for abdominal pain, blood in stool, constipation, diarrhea, nausea and vomiting.  Endocrine: Negative for polydipsia, polyphagia and polyuria.  Genitourinary: Negative for dysuria and flank pain.  Musculoskeletal: Negative for arthralgias, back pain, joint swelling, myalgias and neck stiffness.  Skin: Negative for color change, rash and wound.  Allergic/Immunologic: Positive for environmental allergies.  Neurological: Negative for dizziness, tremors, seizures, speech difficulty, weakness, light-headedness and headaches.   Psychiatric/Behavioral: Negative for behavioral problems, confusion, decreased concentration, dysphoric mood and sleep disturbance. The patient is not nervous/anxious.   All other systems reviewed and are negative.     Objective    BP 136/90 (BP Location: Right Arm, Patient Position: Sitting, Cuff Size: Large)   Pulse 67   Temp 98.2 F (36.8 C) (Oral)   Resp 16   Ht 6' (1.829 m)   Wt 245 lb (111.1 kg)   BMI 33.23 kg/m    Physical Exam   General Appearance:    Mildly obese male. Alert, cooperative, in no acute distress, appears stated age  Head:    Normocephalic, without obvious abnormality, atraumatic  Eyes:    PERRL, conjunctiva/corneas clear, EOM's intact, fundi    benign, both eyes       Ears:    Normal TM's and external ear canals, both ears  Nose:   Nares normal, septum midline, mucosa normal, no drainage   or sinus tenderness  Throat:   Lips, mucosa, and tongue normal; teeth and gums normal  Neck:   Supple, symmetrical, trachea midline, no adenopathy;       thyroid:  No enlargement/tenderness/nodules; no carotid   bruit or JVD  Back:     Symmetric, no curvature, ROM normal, no CVA tenderness  Lungs:     Clear to auscultation bilaterally, respirations unlabored  Chest wall:    No tenderness or deformity  Heart:    Normal heart rate. Normal rhythm. No murmurs, rubs, or gallops.  S1 and S2 normal  Abdomen:     Soft, non-tender, bowel sounds active all four quadrants,    no masses, no organomegaly. Well healed laparoscope scars from prior cholecystectomy  Genitalia:    deferred  Rectal:    deferred  Extremities:   All extremities are intact. No cyanosis or edema  Pulses:   2+ and symmetric all extremities  Skin:   Skin color, texture, turgor normal, no rashes or lesions  Lymph nodes:   Cervical, supraclavicular, and axillary nodes normal  Neurologic:   CNII-XII intact. Normal strength, sensation and reflexes      throughout     Last depression screening scores PHQ  2/9 Scores 10/29/2020 09/13/2018 09/30/2017  PHQ - 2 Score 0 0 0  PHQ- 9  Score 1 - -   Last fall risk screening Fall Risk  10/29/2020  Falls in the past year? 0  Number falls in past yr: 0  Injury with Fall? 0  Follow up Falls evaluation completed   Last Audit-C alcohol use screening No flowsheet data found. A score of 3 or more in women, and 4 or more in men indicates increased risk for alcohol abuse, EXCEPT if all of the points are from question 1   No results found for any visits on 10/29/20.  Assessment & Plan    Routine Health Maintenance and Physical Exam  Exercise Activities and Dietary recommendations Goals   None     Immunization History  Administered Date(s) Administered  . Influenza Split 10/29/2012  . Tdap 02/26/2010    Health Maintenance  Topic Date Due  . Hepatitis C Screening  Never done  . COVID-19 Vaccine (1) Never done  . HIV Screening  Never done  . TETANUS/TDAP  02/27/2020  . INFLUENZA VACCINE  02/21/2021 (Originally 06/24/2020)  . COLONOSCOPY  07/26/2029    Discussed health benefits of physical activity, and encouraged him to engage in regular exercise appropriate for his age and condition.   1. Annual physical exam   2. Need for tetanus, diphtheria, and acellular pertussis (Tdap) vaccine in patient of adolescent age or older  - Tdap vaccine greater than or equal to 7yo IM  3. Need for hepatitis C screening test  - Hepatitis C antibody  4. Prostate cancer screening  - PSA Total (Reflex To Free) ([facility] only)  5. Need for shingles vaccine  - Zoster, Recombinant (Shingrix)  6. Other fatigue His dog had positive ehrlichia serology last months.  - Ehrlichia Antibody Panel  7. Tick bite, unspecified site, initial encounter  - Ehrlichia Antibody Panel  8. Hyperuricemia Doing well with current dose of allopurinol so long as he avoid high protein foods.  - Uric acid  9. Hyperlipemia, mixed Diet controlled.  - Comprehensive  metabolic panel - Lipid panel - CBC with Differential/Platelet   He also reports he has health screening forms required by his insurance that he will bringing by office to have completely.   He was encouraged to get flu vaccine and Covid vaccine and declined both.        The entirety of the information documented in the History of Present Illness, Review of Systems and Physical Exam were personally obtained by me. Portions of this information were initially documented by the CMA and reviewed by me for thoroughness and accuracy.      Lelon Huh, MD  [facility] [phone] (phone) [phone] (fax)  [place]

## 2020-10-29 NOTE — Patient Instructions (Addendum)
.   Covid-19 vaccines: The Covid vaccines have been given to hundreds of millions of people and found to be very effective and are as safe as any other vaccine.  The risk of dying from Covid infections is much higher than having a serious reaction to the vaccine.  I strongly recommend getting fully vaccinated against Covid-19.  Having had Covid provides similar immunity as having one dose of the vaccine. In that case a Covid vaccine is recommended 3-6 months after having had recovered from Covid infection

## 2020-10-30 LAB — CBC WITH DIFFERENTIAL/PLATELET
Basophils Absolute: 0 10*3/uL (ref 0.0–0.2)
Basos: 1 %
EOS (ABSOLUTE): 0.1 10*3/uL (ref 0.0–0.4)
Eos: 2 %
Hematocrit: 46.2 % (ref 37.5–51.0)
Hemoglobin: 16.8 g/dL (ref 13.0–17.7)
Immature Grans (Abs): 0 10*3/uL (ref 0.0–0.1)
Immature Granulocytes: 0 %
Lymphocytes Absolute: 1.2 10*3/uL (ref 0.7–3.1)
Lymphs: 23 %
MCH: 33.6 pg — ABNORMAL HIGH (ref 26.6–33.0)
MCHC: 36.4 g/dL — ABNORMAL HIGH (ref 31.5–35.7)
MCV: 92 fL (ref 79–97)
Monocytes Absolute: 0.6 10*3/uL (ref 0.1–0.9)
Monocytes: 13 %
Neutrophils Absolute: 3 10*3/uL (ref 1.4–7.0)
Neutrophils: 61 %
Platelets: 235 10*3/uL (ref 150–450)
RBC: 5 x10E6/uL (ref 4.14–5.80)
RDW: 11.4 % — ABNORMAL LOW (ref 11.6–15.4)
WBC: 4.9 10*3/uL (ref 3.4–10.8)

## 2020-10-30 LAB — COMPREHENSIVE METABOLIC PANEL
ALT: 32 IU/L (ref 0–44)
AST: 31 IU/L (ref 0–40)
Albumin/Globulin Ratio: 2.2 (ref 1.2–2.2)
Albumin: 4.3 g/dL (ref 3.8–4.9)
Alkaline Phosphatase: 41 IU/L — ABNORMAL LOW (ref 44–121)
BUN/Creatinine Ratio: 22 — ABNORMAL HIGH (ref 9–20)
BUN: 18 mg/dL (ref 6–24)
Bilirubin Total: 0.6 mg/dL (ref 0.0–1.2)
CO2: 23 mmol/L (ref 20–29)
Calcium: 9.3 mg/dL (ref 8.7–10.2)
Chloride: 104 mmol/L (ref 96–106)
Creatinine, Ser: 0.83 mg/dL (ref 0.76–1.27)
GFR calc Af Amer: 117 mL/min/{1.73_m2} (ref >59)
GFR calc non Af Amer: 101 mL/min/{1.73_m2} (ref >59)
Globulin, Total: 2 g/dL (ref 1.5–4.5)
Glucose: 91 mg/dL (ref 65–99)
Potassium: 4.2 mmol/L (ref 3.5–5.2)
Sodium: 142 mmol/L (ref 134–144)
Total Protein: 6.3 g/dL (ref 6.0–8.5)

## 2020-10-30 LAB — EHRLICHIA ANTIBODY PANEL
E. Chaffeensis (HME) IgM Titer: NEGATIVE
E.Chaffeensis (HME) IgG: NEGATIVE
HGE IgG Titer: NEGATIVE
HGE IgM Titer: NEGATIVE

## 2020-10-30 LAB — LIPID PANEL
Chol/HDL Ratio: 3.6 ratio (ref 0.0–5.0)
Cholesterol, Total: 203 mg/dL — ABNORMAL HIGH (ref 100–199)
HDL: 57 mg/dL (ref >39)
LDL Chol Calc (NIH): 124 mg/dL — ABNORMAL HIGH (ref 0–99)
Triglycerides: 126 mg/dL (ref 0–149)
VLDL Cholesterol Cal: 22 mg/dL (ref 5–40)

## 2020-10-30 LAB — URIC ACID: Uric Acid: 4.7 mg/dL (ref 3.8–8.4)

## 2020-10-30 LAB — HEPATITIS C ANTIBODY: Hep C Virus Ab: <0.1 s/co ratio (ref 0.0–0.9)

## 2020-10-30 LAB — PSA TOTAL (REFLEX TO FREE): Prostate Specific Ag, Serum: 0.5 ng/mL (ref 0.0–4.0)

## 2020-11-03 ENCOUNTER — Other Ambulatory Visit: Payer: Self-pay | Admitting: Family Medicine

## 2020-11-03 DIAGNOSIS — R0981 Nasal congestion: Secondary | ICD-10-CM

## 2020-11-03 NOTE — Telephone Encounter (Signed)
Requested Prescriptions  Pending Prescriptions Disp Refills  . allopurinol (ZYLOPRIM) 100 MG tablet [Pharmacy Med Name: ALLOPURINOL 100 MG TABLET] 180 tablet 3    Sig: TAKE 1 TABLET (100 MG TOTAL) BY MOUTH 2 (TWO) TIMES DAILY.     Endocrinology:  Gout Agents Passed - 11/03/2020 10:53 AM      Passed - Uric Acid in normal range and within 360 days    Uric Acid  Date Value Ref Range Status  10/29/2020 4.7 3.8 - 8.4 mg/dL Final    Comment:               Therapeutic target for gout patients: <6.0         Passed - Cr in normal range and within 360 days    Creat  Date Value Ref Range Status  09/30/2017 0.83 0.60 - 1.35 mg/dL Final   Creatinine, Ser  Date Value Ref Range Status  10/29/2020 0.83 0.76 - 1.27 mg/dL Final         Passed - Valid encounter within last 12 months    Recent Outpatient Visits          5 days ago Annual physical exam   [facility] Birdie Sons, MD   7 months ago allergic rhinitis   [place], [place], PA-C   8 months ago Upper back strain, initial encounter   [place], PA-C   9 months ago Right hand pain   [place], [place], [place]   1 year ago Acute pain of right shoulder   [facility] Birdie Sons, MD      Future Appointments            In 12 months Fisher, Kirstie Peri, MD [facility]           . montelukast (SINGULAIR) 10 MG tablet [Pharmacy Med Name: MONTELUKAST SOD 10 MG TABLET] 90 tablet     Sig: TAKE 1 TABLET BY MOUTH EVERYDAY AT BEDTIME     Pulmonology:  Leukotriene Inhibitors Passed - 11/03/2020 10:53 AM      Passed - Valid encounter within last 12 months    Recent Outpatient Visits          5 days ago Annual physical exam   [facility] Birdie Sons, MD   7 months ago allergic rhinitis   [place], [place], [place]   8 months ago Upper back strain,  initial encounter   [place], PA-C   9 months ago Right hand pain   [place], [place], [place]   1 year ago Acute pain of right shoulder   [facility] Birdie Sons, MD      Future Appointments            In 12 months Fisher, Kirstie Peri, MD [facility], [place]

## 2020-11-05 DIAGNOSIS — H9313 Tinnitus, bilateral: Secondary | ICD-10-CM | POA: Diagnosis not present

## 2020-11-05 DIAGNOSIS — H903 Sensorineural hearing loss, bilateral: Secondary | ICD-10-CM | POA: Diagnosis not present

## 2020-11-21 ENCOUNTER — Telehealth: Payer: BC Managed Care – PPO | Admitting: Family Medicine

## 2020-11-21 ENCOUNTER — Telehealth: Payer: Self-pay

## 2020-11-21 DIAGNOSIS — Z03818 Encounter for observation for suspected exposure to other biological agents ruled out: Secondary | ICD-10-CM | POA: Diagnosis not present

## 2020-11-21 DIAGNOSIS — Z1152 Encounter for screening for COVID-19: Secondary | ICD-10-CM | POA: Diagnosis not present

## 2020-11-21 NOTE — Telephone Encounter (Signed)
I called and spoke with patient. He has been exposed and is having mild symptoms. I advised him that out office would be able to do a drive through COVID test this evening. Patient was offered a virtual visit today at 11:20am with Dr. Sherrie Mustache. He accepted the appointment, but then decided to cancel. He wants to call around to see if there is a facility that is able to do rapid COVID test.

## 2020-11-21 NOTE — Telephone Encounter (Signed)
Copied from CRM [id number]. Topic: General - Other >> Nov 21, 2020  8:23 AM Lyn Hollingshead D wrote: PT need to schedule a covid test / please advise

## 2020-12-07 DIAGNOSIS — Z03818 Encounter for observation for suspected exposure to other biological agents ruled out: Secondary | ICD-10-CM | POA: Diagnosis not present

## 2020-12-07 DIAGNOSIS — Z20822 Contact with and (suspected) exposure to covid-19: Secondary | ICD-10-CM | POA: Diagnosis not present

## 2021-04-27 ENCOUNTER — Emergency Department (HOSPITAL_COMMUNITY)
Admission: EM | Admit: 2021-04-27 | Discharge: 2021-04-27 | Disposition: A | Discharge status: Home / Self Care | Payer: BC Managed Care – PPO | Attending: Emergency Medicine | Admitting: Emergency Medicine

## 2021-04-27 ENCOUNTER — Emergency Department (HOSPITAL_COMMUNITY): Payer: BC Managed Care – PPO

## 2021-04-27 DIAGNOSIS — Z9049 Acquired absence of other specified parts of digestive tract: Secondary | ICD-10-CM | POA: Diagnosis not present

## 2021-04-27 DIAGNOSIS — Z87891 Personal history of nicotine dependence: Secondary | ICD-10-CM | POA: Diagnosis not present

## 2021-04-27 DIAGNOSIS — J9811 Atelectasis: Secondary | ICD-10-CM | POA: Diagnosis not present

## 2021-04-27 DIAGNOSIS — Z20822 Contact with and (suspected) exposure to covid-19: Secondary | ICD-10-CM | POA: Diagnosis not present

## 2021-04-27 DIAGNOSIS — R079 Chest pain, unspecified: Secondary | ICD-10-CM | POA: Diagnosis not present

## 2021-04-27 DIAGNOSIS — I10 Essential (primary) hypertension: Secondary | ICD-10-CM | POA: Diagnosis not present

## 2021-04-27 DIAGNOSIS — R0789 Other chest pain: Secondary | ICD-10-CM | POA: Diagnosis not present

## 2021-04-27 LAB — CBC WITH DIFFERENTIAL/PLATELET
Abs Immature Granulocytes: 0.02 10*3/uL (ref 0.00–0.07)
Basophils Absolute: 0 10*3/uL (ref 0.0–0.1)
Basophils Relative: 0 %
Eosinophils Absolute: 0.2 10*3/uL (ref 0.0–0.5)
Eosinophils Relative: 2 %
HCT: 46.5 % (ref 39.0–52.0)
Hemoglobin: 15.9 g/dL (ref 13.0–17.0)
Immature Granulocytes: 0 %
Lymphocytes Relative: 20 %
Lymphs Abs: 1.5 10*3/uL (ref 0.7–4.0)
MCH: 32.1 pg (ref 26.0–34.0)
MCHC: 34.2 g/dL (ref 30.0–36.0)
MCV: 93.9 fL (ref 80.0–100.0)
Monocytes Absolute: 0.8 10*3/uL (ref 0.1–1.0)
Monocytes Relative: 11 %
Neutro Abs: 5 10*3/uL (ref 1.7–7.7)
Neutrophils Relative %: 67 %
Platelets: 194 10*3/uL (ref 150–400)
RBC: 4.95 MIL/uL (ref 4.22–5.81)
RDW: 12.8 % (ref 11.5–15.5)
WBC: 7.6 10*3/uL (ref 4.0–10.5)
nRBC: 0 % (ref 0.0–0.2)

## 2021-04-27 LAB — BASIC METABOLIC PANEL
Anion gap: 7 (ref 5–15)
BUN: 24 mg/dL — ABNORMAL HIGH (ref 6–20)
CO2: 25 mmol/L (ref 22–32)
Calcium: 9.1 mg/dL (ref 8.9–10.3)
Chloride: 104 mmol/L (ref 98–111)
Creatinine, Ser: 0.88 mg/dL (ref 0.61–1.24)
GFR, Estimated: >60 mL/min (ref >60)
Glucose, Bld: 101 mg/dL — ABNORMAL HIGH (ref 70–99)
Potassium: 4.1 mmol/L (ref 3.5–5.1)
Sodium: 136 mmol/L (ref 135–145)

## 2021-04-27 LAB — RESP PANEL BY RT-PCR (FLU A&B, COVID) ARPGX2
Influenza A by PCR: NEGATIVE
Influenza B by PCR: NEGATIVE
SARS Coronavirus 2 by RT PCR: NEGATIVE

## 2021-04-27 LAB — TROPONIN I (HIGH SENSITIVITY)
Troponin I (High Sensitivity): <2 ng/L (ref <18)
Troponin I (High Sensitivity): 2 ng/L (ref <18)

## 2021-04-27 MED ORDER — SODIUM CHLORIDE (PF) 0.9 % IJ SOLN
INTRAMUSCULAR | Status: AC
  Filled 2021-04-27: qty 50

## 2021-04-27 MED ORDER — IOHEXOL 350 MG/ML SOLN
100.0000 mL | Freq: Once | INTRAVENOUS | Status: AC | PRN
  Administered 2021-04-27: 100 mL via INTRAVENOUS

## 2021-04-27 NOTE — ED Provider Notes (Signed)
[facility] DEPT Provider Note   CSN: [id number] Arrival date & time: 04/27/21  1045     History Chief Complaint  Patient presents with  . Chest Pain    Pavlos HUSSIEN GREENBLATT is a 55 y.o. male.  Mr. Warne said he woke up and felt fine.  He and his wife went to shop, and after getting out of the car and starting to walk around, he developed severe left chest pressure.  It radiated to his upper back, jaw, and neck.  It was relieved with rest, and while he is completely still, he is not experiencing any pain.  However, even minimal exertion like bending over and picking something up off the floor will give him pain.  He states that he thinks it is the exertion that creates the pain rather than any movement of the arms or chest.  He did shovel a lot of gravel around his home this past week, but this was not an unusual activity for him.  He states that his son had COVID-19 about 1.5 weeks ago.  However, he has not had any symptoms of COVID-19.  The history is provided by the patient.  Chest Pain Pain location:  L chest Pain quality: pressure   Pain radiates to:  Neck and L jaw Pain severity:  Moderate Onset quality:  Sudden Timing:  Intermittent Progression:  Waxing and waning (only present with exertion; minimal exertion creates pain ) Chronicity:  New Context comment:  Occurred just after getting out of the car to go shopping Relieved by:  Rest Worsened by:  Exertion Associated symptoms: no abdominal pain, no back pain, no cough, no diaphoresis, no fever, no lower extremity edema, no palpitations, no shortness of breath and no vomiting        Past Medical History:  Diagnosis Date  . Acute stress disorder   . Allergic rhinitis   . Esophageal reflux   . Fatty liver   . Gout 2016  . Hearing loss 2001  . Hypercholesterolemia   . Hyperlipidemia   . Lipoma of abdominal wall   . Lumbago   . Multiple benign nevi   . OSA (obstructive sleep apnea)     CPAP    Patient Active Problem List   Diagnosis Date Noted  . Cholelithiasis 08/02/2019  . Hepatic steatosis 08/02/2019  . Stomach irritation   . Abdominal cramps   . Gastric polyp   . Special screening for malignant neoplasms, colon   . Polyp of sigmoid colon   . Anxiety 08/18/2017  . Displacement of lumbar intervertebral disc without myelopathy 07/14/2017  . Hyperuricemia 06/12/2016  . Lipoma of abdominal wall 05/08/2015  . OSA (obstructive sleep apnea) 05/08/2015  . Allergic rhinitis 06/07/2007  . Esophageal reflux 06/07/2007  . Hyperlipemia, mixed 11/24/2006    Past Surgical History:  Procedure Laterality Date  . CHOLECYSTECTOMY N/A 09/12/2019   Procedure: LAPAROSCOPIC CHOLECYSTECTOMY;  Surgeon: Fredirick Maudlin, MD;  Location: [facility] ORS;  Service: General;  Laterality: N/A;  . COLONOSCOPY WITH PROPOFOL N/A 07/27/2019   Procedure: COLONOSCOPY WITH PROPOFOL;  Surgeon: Virgel Manifold, MD;  Location: [facility] ENDOSCOPY;  Service: Endoscopy;  Laterality: N/A;  . ESOPHAGOGASTRODUODENOSCOPY (EGD) WITH PROPOFOL N/A 07/27/2019   Procedure: ESOPHAGOGASTRODUODENOSCOPY (EGD) WITH PROPOFOL;  Surgeon: Virgel Manifold, MD;  Location: [facility] ENDOSCOPY;  Service: Endoscopy;  Laterality: N/A;  . HERNIA REPAIR    . orchidectomy     left  . TONSILLECTOMY    . WISDOM TOOTH EXTRACTION  Family History  Problem Relation Age of Onset  . Emphysema Mother   . Heart disease Father   . Heart attack Father   . Cancer Maternal Grandmother        Bone  . Cancer Paternal Grandfather   . Alcohol abuse Paternal Uncle   . Alcohol abuse Maternal Grandfather     Social History   Tobacco Use  . Smoking status: Never Smoker  . Smokeless tobacco: Former Systems developer    Types: Secondary school teacher  . Vaping Use: Never used  Substance Use Topics  . Alcohol use: Yes    Alcohol/week: 2.0 standard drinks    Types: 2 Cans of beer per week    Comment: occasional  . Drug use: No    Home  Medications Prior to Admission medications   Medication Sig Start Date End Date Taking? Authorizing Provider  allopurinol (ZYLOPRIM) 100 MG tablet TAKE 1 TABLET (100 MG TOTAL) BY MOUTH 2 (TWO) TIMES DAILY. 11/03/20   Birdie Sons, MD  amitriptyline (ELAVIL) 25 MG tablet TAKE 1 TABLET (25 MG TOTAL) BY MOUTH AT BEDTIME AS NEEDED. 09/14/20   Birdie Sons, MD  cetirizine (ZYRTEC) 10 MG tablet Take 10 mg by mouth at bedtime.    [provider]  fexofenadine (ALLEGRA) 180 MG tablet Take 180 mg by mouth daily.    [provider]  fluticasone (FLONASE) 50 MCG/ACT nasal spray Place 2 sprays into both nostrils daily. 03/15/20   Trinna Post, PA-C  psyllium (HYDROCIL/METAMUCIL) 95 % PACK Take 1 packet by mouth daily.    [provider]    Allergies    Niacin  Review of Systems   Review of Systems  Constitutional: Negative for chills, diaphoresis and fever.  HENT: Negative for ear pain and sore throat.   Eyes: Negative for pain and visual disturbance.  Respiratory: Negative for cough and shortness of breath.   Cardiovascular: Positive for chest pain. Negative for palpitations.  Gastrointestinal: Negative for abdominal pain and vomiting.  Genitourinary: Negative for dysuria and hematuria.  Musculoskeletal: Negative for arthralgias and back pain.  Skin: Negative for color change and rash.  Neurological: Negative for seizures and syncope.  All other systems reviewed and are negative.   Physical Exam Updated Vital Signs BP (!) 150/94   Pulse 68   Temp 98.9 F (37.2 C) (Oral)   Resp 13   Ht 5\' 11"  (1.803 m)   Wt 111.1 kg   SpO2 99%   BMI 34.17 kg/m   Physical Exam Vitals and nursing note reviewed.  Constitutional:      Appearance: He is well-developed.  HENT:     Head: Normocephalic and atraumatic.  Eyes:     Conjunctiva/sclera: Conjunctivae normal.  Cardiovascular:     Rate and Rhythm: Normal rate and regular rhythm.     Heart sounds: No  murmur heard.   Pulmonary:     Effort: Pulmonary effort is normal. No respiratory distress.     Breath sounds: Normal breath sounds.  Musculoskeletal:     Cervical back: Neck supple.  Skin:    General: Skin is warm and dry.  Neurological:     General: No focal deficit present.     Mental Status: He is alert.  Psychiatric:        Mood and Affect: Mood normal.        Behavior: Behavior normal.     ED Results / Procedures / Treatments   Labs (all labs ordered are  listed, but only abnormal results are displayed) Labs Reviewed  BASIC METABOLIC PANEL - Abnormal; Notable for the following components:      Result Value   Glucose, Bld 101 (*)    BUN 24 (*)    All other components within normal limits  RESP PANEL BY RT-PCR (FLU A&B, COVID) ARPGX2  CBC WITH DIFFERENTIAL/PLATELET  TROPONIN I (HIGH SENSITIVITY)  TROPONIN I (HIGH SENSITIVITY)    EKG EKG Interpretation  Date/Time:  Saturday April 27 2021 10:55:32 EDT Ventricular Rate:  76 PR Interval:  168 QRS Duration: 90 QT Interval:  381 QTC Calculation: 429 R Axis:   29 Text Interpretation: Sinus rhythm RSR' in V1 or V2, probably normal variant 12 Lead; Mason-Likar Normal axis No acute ischemia Confirmed by Lorre Munroe ([id number]) on 04/27/2021 5:00:52 PM   Radiology DG Chest 2 View  Result Date: 04/27/2021 CLINICAL DATA:  LEFT side chest pain radiating to jaw EXAM: CHEST - 2 VIEW COMPARISON:  09/16/2018 FINDINGS: Normal heart size, mediastinal contours, and pulmonary vascularity. Mild bibasilar atelectasis. Lungs otherwise clear. No infiltrate, pleural effusion, or pneumothorax. Osseous structures unremarkable. IMPRESSION: Bibasilar atelectasis. Electronically Signed   By: Lavonia Dana M.D.   On: 04/27/2021 12:27   CT Angio Chest/Abd/Pel for Dissection W and/or W/WO  Result Date: 04/27/2021 CLINICAL DATA:  Chest pain that radiates to the back and neck. Concern for thoracic aortic aneurysm. EXAM: CT ANGIOGRAPHY CHEST, ABDOMEN AND  PELVIS TECHNIQUE: Non-contrast CT of the chest was initially obtained. Multidetector CT imaging through the chest, abdomen and pelvis was performed using the standard protocol during bolus administration of intravenous contrast. Multiplanar reconstructed images and MIPs were obtained and reviewed to evaluate the vascular anatomy. CONTRAST:  160mL OMNIPAQUE IOHEXOL 350 MG/ML SOLN COMPARISON:  Same day chest radiograph, CT abdomen pelvis dated 08/05/2019, and CT chest dated 05/15/2015. FINDINGS: CTA CHEST FINDINGS Cardiovascular: Preferential opacification of the thoracic aorta. No evidence of thoracic aortic aneurysm or dissection. No evidence of intramural hematoma. No evidence of pulmonary embolism given the limitations of the exam. Normal heart size. No pericardial effusion. Mediastinum/Nodes: No enlarged mediastinal, hilar, or axillary lymph nodes. Thyroid gland, trachea, and esophagus demonstrate no significant findings. Lungs/Pleura: Mild bilateral lower lung atelectasis is noted. There is no focal consolidation, pleural effusion, or pneumothorax. Musculoskeletal: Degenerative changes are seen in the spine. Review of the MIP images confirms the above findings. CTA ABDOMEN AND PELVIS FINDINGS VASCULAR Aorta: Normal caliber aorta without aneurysm, dissection, vasculitis or significant stenosis. Celiac: Patent without evidence of aneurysm, dissection, vasculitis or significant stenosis. SMA: Patent without evidence of aneurysm, dissection, vasculitis or significant stenosis. Renals: Both renal arteries are patent without evidence of aneurysm, dissection, vasculitis, fibromuscular dysplasia or significant stenosis. IMA: Patent without evidence of aneurysm, dissection, vasculitis or significant stenosis. Inflow: Patent without evidence of aneurysm, dissection, vasculitis or significant stenosis. Veins: No obvious venous abnormality within the limitations of this arterial phase study. Review of the MIP images  confirms the above findings. NON-VASCULAR Hepatobiliary: No focal liver abnormality is seen. Status post cholecystectomy. No biliary dilatation. Pancreas: Unremarkable. No pancreatic ductal dilatation or surrounding inflammatory changes. Spleen: Normal in size without focal abnormality. Adrenals/Urinary Tract: Adrenal glands are unremarkable. Kidneys are normal, without renal calculi, focal lesion, or hydronephrosis. Bladder is unremarkable. Stomach/Bowel: Stomach is within normal limits. Appendix appears normal. No evidence of bowel wall thickening, distention, or inflammatory changes. Lymphatic: No enlarged abdominal or retroperitoneal lymph nodes. Reproductive: Prostate is unremarkable. Other: No abdominal wall hernia or abnormality. No abdominopelvic ascites.  Musculoskeletal: Degenerative changes are seen in the spine. Review of the MIP images confirms the above findings. IMPRESSION: No findings to explain the patient's symptoms. No aortic aneurysm, dissection, or intramural hematoma. Electronically Signed   By: Zerita Boers M.D.   On: 04/27/2021 19:29    Procedures Procedures   Medications Ordered in ED Medications - No data to display  ED Course  I have reviewed the triage vital signs and the nursing notes.  Pertinent labs & imaging results that were available during my care of the patient were reviewed by me and considered in my medical decision making (see chart for details).    MDM Rules/Calculators/A&P                          Rmani T Litchford presents with exertional chest pain.  He has tightness with any type of light activity.  At rest he is fine.  He was evaluated for evidence of acute coronary syndrome.  Both troponin levels were quite low, and his EKG is reassuring.  When I reevaluated him after 2 negative troponins, he was still experiencing symptoms with exertion.  Therefore, I was concerned about other emergent cardiopulmonary pathology.  CT angiogram of his aorta failed to  reveal dissection.  I was concerned about this pathology mainly because he did endorse some radiation to the upper back.  Symptoms do not seem consistent with pulmonary embolism.  No evidence of pneumonia or COVID-19.  I have recommended that he follow closely with his primary care doctor.  Incidentally, his blood pressure rose during his ED course.  He will need to follow-up with his doctor to ensure he does not have hypertension. Final Clinical Impression(s) / ED Diagnoses Final diagnoses:  Chest pain, unspecified type  Hypertension, unspecified type    Rx / DC Orders ED Discharge Orders    None       Arnaldo Natal, MD 04/27/21 1949

## 2021-04-27 NOTE — ED Triage Notes (Signed)
Patient reports he was shopping earlier and developed left sided chest pain radiating to neck and jaw. Patient rates pain 8/10

## 2021-04-27 NOTE — ED Provider Notes (Signed)
Emergency Medicine Provider Triage Evaluation Note  Russell Pineda , a 55 y.o. male  was evaluated in triage.  Pt complains of left-sided chest pain rating to the jaw while walking in a department store today.  No history of similar symptoms in the past.  No cough, shortness of breath or leg swelling.  Review of Systems  Positive: Chest pain Negative: Cough, shortness of breath, leg swelling  Physical Exam  BP 139/82 (BP Location: Left Arm)   Pulse 77   Temp 97.9 F (36.6 C) (Oral)   Resp 20   Ht 5\' 11"  (1.803 m)   Wt 111.1 kg   SpO2 97%   BMI 34.17 kg/m  Gen:   Awake, no distress   Resp:  Normal effort  MSK:   Moves extremities without difficulty  Other:  No edema noted to bilateral lower extremities  Medical Decision Making  Medically screening exam initiated at 12:13 PM.  Appropriate orders placed.  Geoge T Abed was informed that the remainder of the evaluation will be completed by another provider, this initial triage assessment does not replace that evaluation, and the importance of remaining in the ED until their evaluation is complete.  Labs, troponin, EKG and chest x-ray ordered   Delia Heady, PA-C 04/27/21 1215    Carmin Muskrat, MD 04/29/21 2320

## 2021-05-03 ENCOUNTER — Ambulatory Visit: Payer: Self-pay

## 2021-05-03 NOTE — Telephone Encounter (Signed)
Reason for Disposition  Pain also in shoulder(s) or arm(s) or jaw (Exception: pain is clearly made worse by movement)  Answer Assessment - Initial Assessment Questions 1. LOCATION: "Where does it hurt?"       Middle, back, neck 2. RADIATION: "Does the pain go anywhere else?" (e.g., into neck, jaw, arms, back)     Yes 3. ONSET: "When did the chest pain begin?" (Minutes, hours or days)      1 hour ago 4. PATTERN "Does the pain come and go, or has it been constant since it started?"  "Does it get worse with exertion?"      Constant 5. DURATION: "How long does it last" (e.g., seconds, minutes, hours)     1 hour 6. SEVERITY: "How bad is the pain?"  (e.g., Scale 1-10; mild, moderate, or severe)    - MILD (1-3): doesn't interfere with normal activities     - MODERATE (4-7): interferes with normal activities or awakens from sleep    - SEVERE (8-10): excruciating pain, unable to do any normal activities       Now -8 7. CARDIAC RISK FACTORS: "Do you have any history of heart problems or risk factors for heart disease?" (e.g., angina, prior heart attack; diabetes, high blood pressure, high cholesterol, smoker, or strong family history of heart disease)     No 8. PULMONARY RISK FACTORS: "Do you have any history of lung disease?"  (e.g., blood clots in lung, asthma, emphysema, birth control pills)     No 9. CAUSE: "What do you think is causing the chest pain?"     Unsure 10. OTHER SYMPTOMS: "Do you have any other symptoms?" (e.g., dizziness, nausea, vomiting, sweating, fever, difficulty breathing, cough)       No 11. PREGNANCY: "Is there any chance you are pregnant?" "When was your last menstrual period?"       N/a  Protocols used: Chest Pain-A-AH

## 2021-05-03 NOTE — Telephone Encounter (Signed)
Pt. Reports he wa seen in ED 04/27/21 with chest pain. "They did all kinds of tests and said my heart is fine." States he has several more episodes that last several minutes to hours. Pain is in center of chest. Radiates to back and neck. Having pain now. 8/10. Instructed to go to ED now. Verbalizes understanding.

## 2021-05-06 ENCOUNTER — Ambulatory Visit (INDEPENDENT_AMBULATORY_CARE_PROVIDER_SITE_OTHER): Payer: BC Managed Care – PPO | Admitting: Family Medicine

## 2021-05-06 ENCOUNTER — Encounter: Payer: Self-pay | Admitting: Family Medicine

## 2021-05-06 ENCOUNTER — Other Ambulatory Visit: Payer: Self-pay

## 2021-05-06 VITALS — BP 120/79 | HR 72 | Wt 246.0 lb

## 2021-05-06 DIAGNOSIS — K219 Gastro-esophageal reflux disease without esophagitis: Secondary | ICD-10-CM

## 2021-05-06 DIAGNOSIS — R079 Chest pain, unspecified: Secondary | ICD-10-CM

## 2021-05-06 MED ORDER — PANTOPRAZOLE SODIUM 40 MG PO TBEC: 40.0000 mg | DELAYED_RELEASE_TABLET | Freq: Every day | ORAL | 4 refills | Status: DC

## 2021-05-06 NOTE — Progress Notes (Signed)
      Established patient visit   Patient: Russell Pineda   DOB: December 30, 1965   55 y.o. Male  MRN: [id number] Visit Date: 05/06/2021  Today's healthcare provider: Lelon Huh, MD   Chief Complaint  Patient presents with   Follow-up   Chest Pain   Subjective    HPI  Follow up ER visit  Patient was seen in ER for chest pain on 04/27/2021. He was treated for Unspecified chest pain. Treatment for this included lab work including normal troponins, EKG, CT Angio  The pain lasted about a day and resolved. Had another nearly identical episode on 6/8 which resolved after about a day. He started taking OTC Zantac on 05/02/2021 and has not had any pain since then. He states he does have history of GERD and was on pantoprazole for years, but reflux symptoms resolved after having cholecystectomy and has since stopped taking pantoprazole.   -----------------------------------------------------------------------------------------   Medications: Outpatient Medications Prior to Visit  Medication Sig   allopurinol (ZYLOPRIM) 100 MG tablet TAKE 1 TABLET (100 MG TOTAL) BY MOUTH 2 (TWO) TIMES DAILY.   amitriptyline (ELAVIL) 25 MG tablet TAKE 1 TABLET (25 MG TOTAL) BY MOUTH AT BEDTIME AS NEEDED.   cetirizine (ZYRTEC) 10 MG tablet Take 10 mg by mouth at bedtime.   fexofenadine (ALLEGRA) 180 MG tablet Take 180 mg by mouth daily.   fluticasone (FLONASE) 50 MCG/ACT nasal spray Place 2 sprays into both nostrils daily.   psyllium (HYDROCIL/METAMUCIL) 95 % PACK Take 1 packet by mouth daily.   No facility-administered medications prior to visit.    Review of Systems  Constitutional: Negative.   Respiratory: Negative.    Cardiovascular:  Positive for chest pain (Comes and goes.). Negative for palpitations and leg swelling.  Gastrointestinal: Negative.   Musculoskeletal:  Positive for back pain (Comes and goes) and neck pain.  Neurological:  Negative for dizziness, light-headedness and headaches.       Objective    BP 120/79 (BP Location: Right Arm, Patient Position: Sitting, Cuff Size: Large)   Pulse 72   Wt 246 lb (111.6 kg)   SpO2 98%   BMI 34.31 kg/m    Physical Exam    General: Appearance:    Obese male in no acute distress  Eyes:    PERRL, conjunctiva/corneas clear, EOM's intact       Lungs:     Clear to auscultation bilaterally, respirations unlabored  Heart:    Normal heart rate. Normal rhythm. No murmurs, rubs, or gallops.    MS:   All extremities are intact.    Neurologic:   Awake, alert, oriented x 3. No apparent focal neurological           defect.        Assessment & Plan     1. Chest pain, unspecified type Negative cardiac work up and chest CTA in ER. Is likely secondary to GERD. He did have unremarkable EGD in 2020, but was not having symptoms at that time.   2. Gastroesophageal reflux disease, unspecified whether esophagitis present Start pantoprazole (PROTONIX) 40 MG tablet; Take 1 tablet (40 mg total) by mouth daily.  se: 90 tablet; Refill: 4   May take OTC zantac in addition to pantoprazole PRN only. Is to call for GI referral is symptoms return on PPI          Lelon Huh, MD  [facility] [phone] (phone) [phone] (fax)  [place]

## 2021-06-06 ENCOUNTER — Other Ambulatory Visit: Payer: Self-pay | Admitting: Family Medicine

## 2021-06-24 ENCOUNTER — Ambulatory Visit: Payer: BC Managed Care – PPO | Admitting: Family Medicine

## 2021-06-24 DIAGNOSIS — Z03818 Encounter for observation for suspected exposure to other biological agents ruled out: Secondary | ICD-10-CM | POA: Diagnosis not present

## 2021-06-24 DIAGNOSIS — Z20822 Contact with and (suspected) exposure to covid-19: Secondary | ICD-10-CM | POA: Diagnosis not present

## 2021-06-24 NOTE — Progress Notes (Deleted)
      Established patient visit   Patient: Russell Pineda   DOB: 05-08-1966   55 y.o. Male  MRN: [id number] Visit Date: 06/24/2021  Today's healthcare provider: Lelon Huh, MD   No chief complaint on file.  Subjective    Back Pain Pertinent negatives include no abdominal pain, chest pain or fever.   ***  {Show patient history (optional):23778}   Medications: Outpatient Medications Prior to Visit  Medication Sig   allopurinol (ZYLOPRIM) 100 MG tablet TAKE 1 TABLET (100 MG TOTAL) BY MOUTH 2 (TWO) TIMES DAILY.   amitriptyline (ELAVIL) 25 MG tablet TAKE 1 TABLET (25 MG TOTAL) BY MOUTH AT BEDTIME AS NEEDED.   cetirizine (ZYRTEC) 10 MG tablet Take 10 mg by mouth at bedtime.   fexofenadine (ALLEGRA) 180 MG tablet Take 180 mg by mouth daily.   fluticasone (FLONASE) 50 MCG/ACT nasal spray Place 2 sprays into both nostrils daily.   pantoprazole (PROTONIX) 40 MG tablet Take 1 tablet (40 mg total) by mouth daily.   psyllium (HYDROCIL/METAMUCIL) 95 % PACK Take 1 packet by mouth daily.   No facility-administered medications prior to visit.    Review of Systems  Constitutional:  Negative for appetite change, chills and fever.  Respiratory:  Negative for chest tightness, shortness of breath and wheezing.   Cardiovascular:  Negative for chest pain and palpitations.  Gastrointestinal:  Negative for abdominal pain, nausea and vomiting.  Musculoskeletal:  Positive for back pain.   {Labs  Heme  Chem  Endocrine  Serology  Results Review (optional):23779}   Objective    There were no vitals taken for this visit. {Show previous vital signs (optional):23777}   Physical Exam  ***  No results found for any visits on 06/24/21.  Assessment & Plan     ***  No follow-ups on file.      {provider attestation***:1}   Lelon Huh, MD  [facility] [phone] (phone) [phone] (fax)  [place]

## 2021-06-25 ENCOUNTER — Ambulatory Visit: Payer: Self-pay | Admitting: *Deleted

## 2021-06-25 NOTE — Telephone Encounter (Signed)
Pt reports direct exposure to individual over weekend, close contact in household, who has tested positive Sunday.Pt reports his symptoms started Saturday evening. Reports had rapid and PCR done yesterday, negative. Onset Saturday, worsening yesterday. Reports sore throat, body aches, subjective fever, headache. Also reports productive cough, "Greenish phlegm" at times. HAs been taking Mucinex "Helps some"  Denies any SOB, no CP.HAs not been vaccinated. Home care advise given. Advised to retest tomorrow or Thursday. Guidelines for  self isolation reviewed as well as symptoms that warrant an ED eval. AFter hours call. Assured pt NT would route to practice for PCPs review and final disposition.'  CB# [phone]

## 2021-06-25 NOTE — Telephone Encounter (Signed)
Summary: Covid negative, symptoms worsening, seeking advice   Pt has tested negative for Covid twice however still has symptoms. They have gotten worse      Reason for Disposition  [1] COVID-19 infection suspected by caller or triager AND [2] mild symptoms (cough, fever, or others) AND [3] negative COVID-19 rapid test  Answer Assessment - Initial Assessment Questions 1. ONSET: "When did the cough begin?"      *No Answer* 2. SEVERITY: "How bad is the cough today?"      *No Answer* 3. SPUTUM: "Describe the color of your sputum" (none, dry cough; clear, white, yellow, green)     *No Answer* 4. HEMOPTYSIS: "Are you coughing up any blood?" If so ask: "How much?" (flecks, streaks, tablespoons, etc.)     *No Answer* 5. DIFFICULTY BREATHING: "Are you having difficulty breathing?" If Yes, ask: "How bad is it?" (e.g., mild, moderate, severe)    - MILD: No SOB at rest, mild SOB with walking, speaks normally in sentences, can lie down, no retractions, pulse < 100.    - MODERATE: SOB at rest, SOB with minimal exertion and prefers to sit, cannot lie down flat, speaks in phrases, mild retractions, audible wheezing, pulse 100-120.    - SEVERE: Very SOB at rest, speaks in single words, struggling to breathe, sitting hunched forward, retractions, pulse > 120      *No Answer* 6. FEVER: "Do you have a fever?" If Yes, ask: "What is your temperature, how was it measured, and when did it start?"     *No Answer* 7. CARDIAC HISTORY: "Do you have any history of heart disease?" (e.g., heart attack, congestive heart failure)      *No Answer* 8. LUNG HISTORY: "Do you have any history of lung disease?"  (e.g., pulmonary embolus, asthma, emphysema)     *No Answer* 9. PE RISK FACTORS: "Do you have a history of blood clots?" (or: recent major surgery, recent prolonged travel, bedridden)     *No Answer* 10. OTHER SYMPTOMS: "Do you have any other symptoms?" (e.g., runny nose, wheezing, chest pain)       *No Answer* 11.  PREGNANCY: "Is there any chance you are pregnant?" "When was your last menstrual period?"       *No Answer* 12. TRAVEL: "Have you traveled out of the country in the last month?" (e.g., travel history, exposures)       *No Answer*  Answer Assessment - Initial Assessment Questions 1. COVID-19 EXPOSURE: "Please describe how you were exposed to someone with a COVID-19 infection."     Past weekend, Friday. Sunday friend tested positive 2. PLACE of CONTACT: "Where were you when you were exposed to COVID-19?" (e.g., home, school, medical waiting room; which city?)     House 3. TYPE of CONTACT: "How much contact was there?" (e.g., sitting next to, live in same house, work in same office, same building)     A lot, same camp 4. DURATION of CONTACT: "How long were you in contact with the COVID-19 patient?" (e.g., a few seconds, passed by person, a few minutes, 15 minutes or longer, live with the patient)     All weekend 5. MASK: "Were you wearing a mask?" "Was the other person wearing a mask?" Note: wearing a mask reduces the risk of an otherwise close contact.     no 6. DATE of CONTACT: "When did you have contact with a COVID-19 patient?" (e.g., how many days ago)     Friday 7. COMMUNITY SPREAD: "Are there lots  of cases of COVID-19 (community spread) where you live?" (See public health department website, if unsure)       8. SYMPTOMS: "Do you have any symptoms?" (e.g., fever, cough, breathing difficulty, loss of taste or smell)     Cough, productive, sore throat, headache, body aches 9. VACCINE: "Have you gotten the COVID-19 vaccine?" If Yes, ask: "Which one, how many shots, when did you get it?"     no 10. BOOSTER: "Have you received your COVID-19 booster?" If Yes, ask: "Which one and when did you get it?"         12. HIGH RISK: "Do you have any heart or lung problems?" (e.g., asthma , COPD, heart failure) "Do you have a weak immune system or other risk factors?" (e.g., HIV positive, chemotherapy,  renal failure, diabetes mellitus, sickle cell anemia, obesity)  Protocols used: Cough - Acute Productive-A-AH, Coronavirus (COVID-19) Exposure-A-AH, Coronavirus (COVID-19) Diagnosed or Suspected-A-AH

## 2021-06-26 ENCOUNTER — Telehealth: Payer: BC Managed Care – PPO | Admitting: Nurse Practitioner

## 2021-06-26 ENCOUNTER — Encounter: Payer: Self-pay | Admitting: Nurse Practitioner

## 2021-06-26 DIAGNOSIS — U099 Post covid-19 condition, unspecified: Secondary | ICD-10-CM | POA: Diagnosis not present

## 2021-06-26 DIAGNOSIS — Z20822 Contact with and (suspected) exposure to covid-19: Secondary | ICD-10-CM | POA: Diagnosis not present

## 2021-06-26 DIAGNOSIS — Z03818 Encounter for observation for suspected exposure to other biological agents ruled out: Secondary | ICD-10-CM | POA: Diagnosis not present

## 2021-06-26 MED ORDER — NIRMATRELVIR/RITONAVIR (PAXLOVID)TABLET: 3.0000 | ORAL_TABLET | Freq: Two times a day (BID) | ORAL | 0 refills | Status: AC

## 2021-06-26 NOTE — Telephone Encounter (Signed)
FYI

## 2021-06-26 NOTE — Progress Notes (Signed)
Virtual Visit Consent   [name], you are scheduled for a virtual visit with Russell Pineda, [place], a [facility] provider, today.     Just as with appointments in the office, your consent must be obtained to participate.  Your consent will be active for this visit and any virtual visit you may have with one of our providers in the next 365 days.     If you have a MyChart account, a copy of this consent can be sent to you electronically.  All virtual visits are billed to your insurance company just like a traditional visit in the office.    As this is a virtual visit, video technology does not allow for your provider to perform a traditional examination.  This may limit your provider's ability to fully assess your condition.  If your provider identifies any concerns that need to be evaluated in person or the need to arrange testing (such as labs, EKG, etc.), we will make arrangements to do so.     Although advances in technology are sophisticated, we cannot ensure that it will always work on either your end or our end.  If the connection with a video visit is poor, the visit may have to be switched to a telephone visit.  With either a video or telephone visit, we are not always able to ensure that we have a secure connection.     I need to obtain your verbal consent now.   Are you willing to proceed with your visit today? YES   Darshawn T Bankson has provided verbal consent on 06/26/2021 for a virtual visit (video or telephone).   Russell Hassell Done, FNP   Date: 06/26/2021 10:30 AM   Virtual Visit via Video Note   I, Russell Terren Jandreau, connected with Russell Pineda ([id number], Apr 15, 1966) on 06/26/21 at 10:45 AM EDT by a video-enabled telemedicine application and verified that I am speaking with the correct person using two identifiers.  Location: Patient: Virtual Visit Location Patient: Home Provider: Virtual Visit Location Provider: Mobile   I discussed the limitations  of evaluation and management by telemedicine and the availability of in person appointments. The patient expressed understanding and agreed to proceed.    History of Present Illness: Russell Pineda is a 55 y.o. who identifies as a male who was assigned male at birth, and is being seen today for covid positive.  HPI: Patient was exposed to covid over weekem\nd. He started  getting symptoms late Monday evening/ with scratchy throat Has progressed t o cough and congestion.  Review of Systems  Constitutional:  Positive for chills, fever and malaise/fatigue.  HENT:  Positive for congestion and sore throat.   Respiratory:  Positive for cough and sputum production. Negative for shortness of breath.   Musculoskeletal:  Positive for myalgias.  Neurological:  Negative for headaches.   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/08/2015   OSA (obstructive sleep apnea) 05/08/2015   Allergic rhinitis 06/07/2007   Esophageal reflux 06/07/2007   Hyperlipemia, mixed 11/24/2006    Allergies:  Allergies  Allergen Reactions  Niacin Other (See Comments)    Flushing   Medications:  Current Outpatient Medications:    allopurinol (ZYLOPRIM) 100 MG tablet, TAKE 1 TABLET (100 MG TOTAL) BY MOUTH 2 (TWO) TIMES DAILY., Disp: 180 tablet, Rfl: 3   amitriptyline (ELAVIL) 25 MG tablet, TAKE 1 TABLET (25 MG TOTAL) BY MOUTH AT BEDTIME AS NEEDED., Disp: 90 tablet, Rfl: 1   cetirizine (ZYRTEC) 10 MG tablet, Take 10 mg by mouth at bedtime., Disp: , Rfl:    fexofenadine (ALLEGRA) 180 MG tablet, Take 180 mg by mouth daily., Disp: , Rfl:     fluticasone (FLONASE) 50 MCG/ACT nasal spray, Place 2 sprays into both nostrils daily., Disp: 16 g, Rfl: 6   pantoprazole (PROTONIX) 40 MG tablet, Take 1 tablet (40 mg total) by mouth daily., Disp: 90 tablet, Rfl: 4   psyllium (HYDROCIL/METAMUCIL) 95 % PACK, Take 1 packet by mouth daily., Disp: , Rfl:   Observations/Objective: Patient is well-developed, well-nourished in no acute distress.  Resting comfortably  at home.  Head is normocephalic, atraumatic.  No labored breathing.  Speech is clear and coherent with logical content.  Patient is alert and oriented at baseline.  Dry cough noted  Assessment and Plan:  Russell Pineda in today with chief complaint of Covid Positive   1. 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 Take meds as prescribed 2. Use a cool mist humidifier especially during the winter months and when heat has been humid. 3. Use saline nose sprays frequently 4. Saline irrigations of the nose can be very helpful if Pineda frequently.  * 4X daily for 1 week*  * Use of a nettie pot can be helpful with this. Follow directions with this* 5. Drink plenty of fluids 6. Keep thermostat turn down low 7.For any cough or congestion  Use plain Mucinex- regular strength or max strength is fine   * Children- consult with Pharmacist for dosing 8. For fever or aces or pains- take tylenol or ibuprofen appropriate for age and weight.  * for fevers greater than 101 orally you may alternate ibuprofen and tylenol every  3 hours.   Meds ordered this encounter  Medications   nirmatrelvir/ritonavir EUA (PAXLOVID) TABS    Sig: Take 3 tablets by mouth 2 (two) times daily for 5 days. (Take nirmatrelvir 150 mg two tablets twice daily for 5 days and ritonavir 100 mg one tablet twice daily for 5 days) Patient GFR is 60    Dispense:  30 tablet    Refill:  0    Order Specific Question:   Supervising Provider    Answer:   Noemi Chapel [[id number]]        Follow Up Instructions: I discussed the  assessment and treatment plan with the patient. The patient was provided an opportunity to ask questions and all were answered. The patient agreed with the plan and demonstrated an understanding of the instructions.  A copy of instructions were sent to the patient via MyChart.  The patient was advised to call back or seek an in-person evaluation if the symptoms worsen or if the condition fails to improve as anticipated.  Time:  I spent 10 minutes with the patient via telehealth technology discussing the above problems/concerns.    Russell Hassell Done, FNP

## 2021-06-26 NOTE — Patient Instructions (Signed)
You are being prescribed PAXLOVID for COVID-19 infection.     Please pick up your [facility] at[facility]    Please call the pharmacy or go through the drive through vs going inside if you are picking up the mediation yourself to prevent further spread. If prescribed to a [facility] affiliated pharmacy, a pharmacist will bring the medication out to your car.   Medications to hold while taking this treatment: none  *If asked to hold, you can resume them 24 hours after your last dose   ADMINISTRATION INSTRUCTIONS: Take with or without food. Swallow the tablets whole. Don't chew, crush, or break the medications because it might not work as well  For each dose of the medication, you should be taking 3 tablets together (2 pink oval and 1 white oval) TWICE a day for FIVE days   Finish your full five-day course of Paxlovid even if you feel better before you're done. Stopping this medication too early can make it less effective to prevent severe illness related to [place].    Paxlovid is prescribed for YOU ONLY. Don't share it with others, even if they have similar symptoms as you. This medication might not be right for everyone.  Make sure to take steps to protect yourself and others while you're taking this medication in order to get well soon and to prevent others from getting sick with COVID-19.  Paxlovid (nirmatrelvir / ritonavir) can cause hormonal birth control medications to not work well. If you or your partner is currently taking hormonal birth control, use condoms or other birth control methods to prevent unintended pregnancies.    COMMON SIDE EFFECTS: Altered or bad taste in your mouth  Diarrhea  High blood pressure (1% of people) Muscle aches (1% of people)     If your COVID-19 symptoms get worse, get medical help right away. Call 911 if you experience symptoms such as worsening cough, trouble breathing, chest pain that doesn't go away, confusion, a hard time staying  awake, and pale or blue-colored skin. This medication won't prevent all COVID-19 cases from getting worse.

## 2021-06-28 ENCOUNTER — Telehealth: Payer: BC Managed Care – PPO | Admitting: Emergency Medicine

## 2021-06-28 DIAGNOSIS — U071 COVID-19: Secondary | ICD-10-CM

## 2021-06-28 MED ORDER — LIDOCAINE VISCOUS HCL 2 % MT SOLN: 15.0000 mL | Freq: Four times a day (QID) | OROMUCOSAL | 0 refills | Status: DC | PRN

## 2021-06-28 NOTE — Progress Notes (Signed)
Based on the information that you have shared in the e-Visit Questionnaire, we recommend that you convert this visit to a video visit in order for the provider to better assess what is going on.  The provider will be able to give you a more accurate diagnosis and treatment plan if we can more freely discuss your symptoms and with the addition of a virtual examination.   In a video visit, we are able to prescribe the antivirals that treat COVID.  I think this would help you most, but it requires a more detailed history and review of your situation.  If you convert to a video visit, we will bill your insurance (similar to an office visit) and you will not be charged for this e-Visit. You will be able to stay at home and speak with the first available [facility] advanced practice provider. The link to do a video visit is in the drop down Menu tab of your Welcome screen in [place].   Approximately 5 minutes was used in reviewing the patient's chart, questionnaire, prescribing medications, and documentation.

## 2021-06-28 NOTE — Progress Notes (Signed)
I would consider it normal for you to still have some worsening symptoms.  The antiviral doesn't really help with your symptoms, it just helps you to get better faster.  I would recommend continuing the course of the antiviral and I'd continue with over-the-counter cough and cold medications.  I'll send over some lidocaine that can help to numb your throat.  Unfortunately, there aren't really any other options available.  Please also review the info below.     E-Visit  for Positive Covid Test Result  We are sorry you are not feeling well. We are here to help!  You have tested positive for COVID-19, meaning that you were infected with the novel coronavirus and could give the virus to others.  It is vitally important that you stay home so you do not spread it to others.      Please continue isolation at home, for at least 10 days since the start of your symptoms and until you have had 24 hours with no fever (without taking a fever reducer) and with improving of symptoms.  If you have no symptoms but tested positive (or all symptoms resolve after 5 days and you have no fever) you can leave your house but continue to wear a mask around others for an additional 5 days. If you have a fever,continue to stay home until you have had 24 hours of no fever. Most cases improve 5-10 days from onset but we have seen a small number of patients who have gotten worse after the 10 days.  Please be sure to watch for worsening symptoms and remain taking the proper precautions.   Go to the nearest hospital ED for assessment if fever/cough/breathlessness are severe or illness seems like a threat to life.    The following symptoms may appear 2-14 days after exposure: Fever Cough Shortness of breath or difficulty breathing Chills Repeated shaking with chills Muscle pain Headache Sore throat New loss of taste or smell Fatigue Congestion or runny nose Nausea or vomiting Diarrhea  You may also take  acetaminophen (Tylenol) as needed for fever.  HOME CARE: Only take medications as instructed by your medical team. Drink plenty of fluids and get plenty of rest. A steam or ultrasonic humidifier can help if you have congestion.   GET HELP RIGHT AWAY IF YOU HAVE EMERGENCY WARNING SIGNS.  Call 911 or proceed to your closest emergency facility if: You develop worsening high fever. Trouble breathing Bluish lips or face Persistent pain or pressure in the chest New confusion Inability to wake or stay awake You cough up blood. Your symptoms become more severe Inability to hold down food or fluids  This list is not all possible symptoms. Contact your medical provider for any symptoms that are severe or concerning to you.    Your e-visit answers were reviewed by a board certified advanced clinical practitioner to complete your personal care plan.  Depending on the condition, your plan could have included both over the counter or prescription medications.  If there is a problem please reply once you have received a response from your provider.  Your safety is important to [place].  If you have drug allergies check your prescription carefully.    You can use MyChart to ask questions about today's visit, request a non-urgent call back, or ask for a work or school excuse for 24 hours related to this e-Visit. If it has been greater than 24 hours you will need to follow up with your provider,  or enter a new e-Visit to address those concerns. You will get an e-mail in the next two days asking about your experience.  I hope that your e-visit has been valuable and will speed your recovery. Thank you for using e-visits.     Approximately 5 minutes was used in reviewing the patient's chart, questionnaire, prescribing medications, and documentation.

## 2021-06-28 NOTE — Addendum Note (Signed)
Addended by: Montine Circle B on: 06/28/2021 09:20 AM   Modules accepted: Orders

## 2021-07-08 ENCOUNTER — Ambulatory Visit: Payer: BC Managed Care – PPO | Admitting: Family Medicine

## 2021-07-08 NOTE — Progress Notes (Deleted)
      Established patient visit   Patient: Russell Pineda   DOB: September 01, 1966   55 y.o. Male  MRN: [id number] Visit Date: 07/08/2021  Today's healthcare provider: Gwyneth Sprout, FNP   No chief complaint on file.  Subjective    Back Pain This is a chronic problem. The current episode started more than 1 year ago. The problem occurs intermittently.   ***  {Show patient history (optional):23778}   Medications: Outpatient Medications Prior to Visit  Medication Sig  . allopurinol (ZYLOPRIM) 100 MG tablet TAKE 1 TABLET (100 MG TOTAL) BY MOUTH 2 (TWO) TIMES DAILY.  Marland Kitchen amitriptyline (ELAVIL) 25 MG tablet TAKE 1 TABLET (25 MG TOTAL) BY MOUTH AT BEDTIME AS NEEDED.  Marland Kitchen cetirizine (ZYRTEC) 10 MG tablet Take 10 mg by mouth at bedtime.  . fexofenadine (ALLEGRA) 180 MG tablet Take 180 mg by mouth daily.  . fluticasone (FLONASE) 50 MCG/ACT nasal spray Place 2 sprays into both nostrils daily.  Marland Kitchen lidocaine (XYLOCAINE) 2 % solution Use as directed 15 mLs in the mouth or throat every 6 (six) hours as needed for mouth pain.  . pantoprazole (PROTONIX) 40 MG tablet Take 1 tablet (40 mg total) by mouth daily.  . psyllium (HYDROCIL/METAMUCIL) 95 % PACK Take 1 packet by mouth daily.   No facility-administered medications prior to visit.    Review of Systems  Musculoskeletal:  Positive for back pain.   {Labs  Heme  Chem  Endocrine  Serology  Results Review (optional):23779}   Objective    There were no vitals taken for this visit. {Show previous vital signs (optional):23777}   Physical Exam  ***  No results found for any visits on 07/08/21.  Assessment & Plan     ***  No follow-ups on file.      {provider attestation***:1}   Gwyneth Sprout, [place] [phone] (phone) [phone] (fax)  [place]

## 2021-07-08 NOTE — Progress Notes (Signed)
Established patient visit   Patient: Russell Pineda   DOB: 08/24/1966   55 y.o. Male  MRN: [id number] Visit Date: 07/09/2021  Today's healthcare provider: Gwyneth Sprout, FNP   Chief Complaint  Patient presents with   Back Pain   Edema    Patient reports in the past 7 days or more he noticed  swelling in his lower right leg   Skin Problem    Patient reports that he has noticed change of his scalp and states that he has concern of mole that has been present, he denies any growth or change to area since he first discovered it.   Subjective  -------------------------------------------------------------------------------------------------------------------- Back Pain This is a recurrent problem. The current episode started more than 1 year ago. The problem occurs intermittently. The problem has been waxing and waning since onset. The pain is present in the lumbar spine. The quality of the pain is described as aching and burning (dull pain). The pain does not radiate. The symptoms are aggravated by bending, position and twisting (patient believed that pain was triggered by certains foods that he would eat often such as salads). Associated symptoms include abdominal pain. Pertinent negatives include no bladder incontinence, bowel incontinence, chest pain, dysuria, fever, headaches, leg pain, numbness, paresis, paresthesias, pelvic pain, perianal numbness, tingling, weakness or weight loss. He has tried nothing for the symptoms.  HPI     Edema    Additional comments: Patient reports in the past 7 days or more he noticed  swelling in his lower right leg        Skin Problem    Additional comments: Patient reports that he has noticed change of his scalp and states that he has concern of mole that has been present, he denies any growth or change to area since he first discovered it.      Last edited by Minette Headland, CMA on 07/09/2021  8:49 AM.        Patient Active Problem  List   Diagnosis Date Noted   Encounter for skin care 07/09/2021   Slow transit constipation 07/09/2021   Status post cholecystectomy 07/09/2021   Varicose veins of right lower extremity with inflammation 07/09/2021   Cholelithiasis 08/02/2019   Hepatic steatosis 08/02/2019   Stomach irritation    Abdominal cramps    Gastric polyp    Special screening for malignant neoplasms, colon    Polyp of sigmoid colon    Anxiety 08/18/2017   Displacement of lumbar intervertebral disc without myelopathy 07/14/2017   Hyperuricemia 06/12/2016   Lipoma of abdominal wall 05/08/2015   OSA (obstructive sleep apnea) 05/08/2015   Allergic rhinitis 06/07/2007   Esophageal reflux 06/07/2007   Hyperlipemia, mixed 11/24/2006   Past Medical History:Diagnosis Date   Acute stress disorder    Allergic rhinitis    Esophageal reflux    Fatty liver    Gout 2016   Hearing loss 2001   Hypercholesterolemia    Hyperlipidemia    Lipoma of abdominal wall    Lumbago    Multiple benign nevi    OSA (obstructive sleep apnea)    CPAP   Family HistoryProblem Relation Age of Onset   Emphysema Mother    Heart disease Father    Heart attack Father    Cancer Maternal Grandmother        Bone   Cancer Paternal Grandfather    Alcohol abuse Paternal Uncle    Alcohol abuse Maternal Grandfather  AllergiesAllergen Reactions   Niacin Other (See Comments)    Flushing       Medications: Outpatient Medications Prior to Visit  Medication Sig   allopurinol (ZYLOPRIM) 100 MG tablet TAKE 1 TABLET (100 MG TOTAL) BY MOUTH 2 (TWO) TIMES DAILY.   amitriptyline (ELAVIL) 25 MG tablet TAKE 1 TABLET (25 MG TOTAL) BY MOUTH AT BEDTIME AS NEEDED.   fexofenadine (ALLEGRA) 180 MG tablet Take 180 mg by mouth daily.   psyllium (HYDROCIL/METAMUCIL) 95 % PACK Take 1 packet by mouth daily.   [DISCONTINUED] cetirizine (ZYRTEC) 10 MG tablet Take 10 mg by mouth at bedtime.   [DISCONTINUED] fluticasone (FLONASE) 50 MCG/ACT nasal spray  Place 2 sprays into both nostrils daily.   [DISCONTINUED] lidocaine (XYLOCAINE) 2 % solution Use as directed 15 mLs in the mouth or throat every 6 (six) hours as needed for mouth pain.   [DISCONTINUED] pantoprazole (PROTONIX) 40 MG tablet Take 1 tablet (40 mg total) by mouth daily.   No facility-administered medications prior to visit.    Review of Systems  Constitutional:  Negative for fever and weight loss.  Cardiovascular:  Negative for chest pain.  Gastrointestinal:  Positive for abdominal pain. Negative for bowel incontinence.  Genitourinary:  Negative for bladder incontinence, dysuria and pelvic pain.  Musculoskeletal:  Positive for back pain.  Neurological:  Negative for tingling, weakness, numbness, headaches and paresthesias.      Objective  -------------------------------------------------------------------------------------------------------------------- BP 120/83   Pulse 68   Temp 98.4 F (36.9 C) (Oral)   Resp 16   Wt 241 lb 14.4 oz (109.7 kg)   BMI 33.74 kg/m     Physical Exam Vitals and nursing note reviewed.  Constitutional:      General: He is not in acute distress.    Appearance: Normal appearance. He is obese. He is not ill-appearing or diaphoretic.  Cardiovascular:     Rate and Rhythm: Normal rate and regular rhythm.     Heart sounds: Normal heart sounds, S1 normal and S2 normal. No murmur heard.   No friction rub.     Comments: RLE- non pitting edema,  Pulmonary:     Effort: Pulmonary effort is normal.     Breath sounds: Normal breath sounds.  Abdominal:     General: There is no distension.     Palpations: Abdomen is soft. There is no mass.     Tenderness: There is no abdominal tenderness. There is no right CVA tenderness, left CVA tenderness, guarding or rebound.     Hernia: No hernia is present.  Musculoskeletal:        General: Normal range of motion.     Right lower leg: Edema present.       Legs:  Skin:    General: Skin is dry.      Capillary Refill: Capillary refill takes less than 2 seconds.  Neurological:     General: No focal deficit present.     Mental Status: He is alert and oriented to person, place, and time.  Psychiatric:        Mood and Affect: Mood normal.        Behavior: Behavior normal.        Thought Content: Thought content normal.        Judgment: Judgment normal.      No results found for any visits on 07/09/21.  Assessment & Plan  ---------------------------------------------------------------------------------------------------------------------- Problem List Items Addressed This Visit       Cardiovascular and Mediastinum   Varicose veins  of right lower extremity with inflammation    Slight edema noted in RLE from ankle to foot, non pitting Discussed importance of low salt diet Discussed goal of 64 oz water intake Discussed need for frequent ambulation, goal up every hour or flexing feet when seated for long periods No changes in veins noted; prev R ankle injury, cool from mid ankle down Negative edema in remainder of leg Wells score of 1- hold off on PVLs at this time        Digestive   Esophageal reflux    Less complaints following gallbladder removal Continue to take PPI; refills ordered Plan for gastro referral at later date      Relevant Medications   pantoprazole (PROTONIX) 40 MG tablet   Slow transit constipation - Primary    Daily use of fiber supplement Discussed water intake Plan to switch dosing from AM to PM Suggestion of use of Miralax in place of fiber supplement Goal to get fiber and water from dietary intake Continue to monitor GI symptoms         Other   Encounter for skin care    Pt concerned over spot over L eye on top of crown of head 1.30m,  round, red Has not changed shape, color, size Discussed ABCDs of Melanoma Advised 30 SPF, use of hats, remaining out of sun from 10a-2p, aka "prime sun" hours Amb ref to derm       Relevant Orders    Ambulatory referral to Dermatology   Status post cholecystectomy    GI complaints of generalized abdominal pain following high fiber meals Pt roughly follow a 'keto' diet Recommend foods low in saturated fat as well as small, frequent dosing Choosing water as drink of choice and avoiding caloric beverages Remain on PPI for known GERD; plan for endoscopy in future       Return if symptoms worsen or fail to improve.      IVonna Kotyk FNP, have reviewed all documentation for this visit. The documentation on 07/09/21 for the exam, diagnosis, procedures, and orders are all accurate and complete.    EGwyneth Sprout [phone](phone) [phone](fax)  [place]

## 2021-07-09 ENCOUNTER — Encounter: Payer: Self-pay | Admitting: Family Medicine

## 2021-07-09 ENCOUNTER — Ambulatory Visit (INDEPENDENT_AMBULATORY_CARE_PROVIDER_SITE_OTHER): Payer: BC Managed Care – PPO | Admitting: Family Medicine

## 2021-07-09 ENCOUNTER — Other Ambulatory Visit: Payer: Self-pay

## 2021-07-09 VITALS — BP 120/83 | HR 68 | Temp 98.4°F | Resp 16 | Wt 241.9 lb

## 2021-07-09 DIAGNOSIS — I8311 Varicose veins of right lower extremity with inflammation: Secondary | ICD-10-CM | POA: Insufficient documentation

## 2021-07-09 DIAGNOSIS — Z9049 Acquired absence of other specified parts of digestive tract: Secondary | ICD-10-CM | POA: Diagnosis not present

## 2021-07-09 DIAGNOSIS — K5901 Slow transit constipation: Secondary | ICD-10-CM | POA: Diagnosis not present

## 2021-07-09 DIAGNOSIS — K219 Gastro-esophageal reflux disease without esophagitis: Secondary | ICD-10-CM | POA: Diagnosis not present

## 2021-07-09 DIAGNOSIS — Z7689 Persons encountering health services in other specified circumstances: Secondary | ICD-10-CM | POA: Insufficient documentation

## 2021-07-09 MED ORDER — PANTOPRAZOLE SODIUM 40 MG PO TBEC: 40.0000 mg | DELAYED_RELEASE_TABLET | Freq: Every day | ORAL | 4 refills | Status: DC

## 2021-07-09 NOTE — Assessment & Plan Note (Signed)
Daily use of fiber supplement Discussed water intake Plan to switch dosing from AM to PM Suggestion of use of Miralax in place of fiber supplement Goal to get fiber and water from dietary intake Continue to monitor GI symptoms

## 2021-07-09 NOTE — Assessment & Plan Note (Signed)
Slight edema noted in RLE from ankle to foot, non pitting Discussed importance of low salt diet Discussed goal of 64 oz water intake Discussed need for frequent ambulation, goal up every hour or flexing feet when seated for long periods No changes in veins noted; prev R ankle injury, cool from mid ankle down Negative edema in remainder of leg Wells score of 1- hold off on PVLs at this time

## 2021-07-09 NOTE — Assessment & Plan Note (Signed)
Less complaints following gallbladder removal Continue to take PPI; refills ordered Plan for gastro referral at later date

## 2021-07-09 NOTE — Assessment & Plan Note (Signed)
GI complaints of generalized abdominal pain following high fiber meals Pt roughly follow a 'keto' diet Recommend foods low in saturated fat as well as small, frequent dosing Choosing water as drink of choice and avoiding caloric beverages Remain on PPI for known GERD; plan for endoscopy in future

## 2021-07-09 NOTE — Assessment & Plan Note (Signed)
Pt concerned over spot over L eye on top of crown of head 1.110m,  round, red Has not changed shape, color, size Discussed ABCDs of Melanoma Advised 30 SPF, use of hats, remaining out of sun from 10a-2p, aka "prime sun" hours Amb ref to derm

## 2021-07-23 DIAGNOSIS — H903 Sensorineural hearing loss, bilateral: Secondary | ICD-10-CM | POA: Diagnosis not present

## 2021-07-24 DIAGNOSIS — H903 Sensorineural hearing loss, bilateral: Secondary | ICD-10-CM | POA: Diagnosis not present

## 2021-08-05 DIAGNOSIS — H903 Sensorineural hearing loss, bilateral: Secondary | ICD-10-CM | POA: Diagnosis not present

## 2021-08-27 DIAGNOSIS — D225 Melanocytic nevi of trunk: Secondary | ICD-10-CM | POA: Diagnosis not present

## 2021-08-27 DIAGNOSIS — D1801 Hemangioma of skin and subcutaneous tissue: Secondary | ICD-10-CM | POA: Diagnosis not present

## 2021-08-27 DIAGNOSIS — D2361 Other benign neoplasm of skin of right upper limb, including shoulder: Secondary | ICD-10-CM | POA: Diagnosis not present

## 2021-08-27 DIAGNOSIS — L578 Other skin changes due to chronic exposure to nonionizing radiation: Secondary | ICD-10-CM | POA: Diagnosis not present

## 2021-08-30 ENCOUNTER — Encounter: Payer: Self-pay | Admitting: General Surgery

## 2021-09-09 DIAGNOSIS — H40013 Open angle with borderline findings, low risk, bilateral: Secondary | ICD-10-CM | POA: Diagnosis not present

## 2021-09-24 ENCOUNTER — Telehealth: Payer: Self-pay | Admitting: Family Medicine

## 2021-09-24 DIAGNOSIS — T753XXA Motion sickness, initial encounter: Secondary | ICD-10-CM

## 2021-09-24 MED ORDER — SCOPOLAMINE 1 MG/3DAYS TD PT72: 1.0000 | MEDICATED_PATCH | TRANSDERMAL | 0 refills | Status: DC

## 2021-09-24 NOTE — Telephone Encounter (Signed)
Patient is going fishing this Friday and would like PCP to prescribe scopolamine patches for nauseousness please send Rx to   [id number] [address], [place] Phone:  [phone]  Fax:  [phone]

## 2021-10-15 ENCOUNTER — Other Ambulatory Visit: Payer: Self-pay

## 2021-10-15 ENCOUNTER — Encounter: Payer: Self-pay | Admitting: Family Medicine

## 2021-10-15 ENCOUNTER — Ambulatory Visit (INDEPENDENT_AMBULATORY_CARE_PROVIDER_SITE_OTHER): Payer: BC Managed Care – PPO | Admitting: Family Medicine

## 2021-10-15 VITALS — BP 132/85 | HR 77 | Temp 98.6°F | Ht 72.0 in | Wt 249.1 lb

## 2021-10-15 DIAGNOSIS — Z0000 Encounter for general adult medical examination without abnormal findings: Secondary | ICD-10-CM | POA: Diagnosis not present

## 2021-10-15 DIAGNOSIS — F439 Reaction to severe stress, unspecified: Secondary | ICD-10-CM

## 2021-10-15 DIAGNOSIS — F5101 Primary insomnia: Secondary | ICD-10-CM | POA: Diagnosis not present

## 2021-10-15 DIAGNOSIS — E790 Hyperuricemia without signs of inflammatory arthritis and tophaceous disease: Secondary | ICD-10-CM

## 2021-10-15 DIAGNOSIS — E782 Mixed hyperlipidemia: Secondary | ICD-10-CM

## 2021-10-15 MED ORDER — PROPRANOLOL HCL 60 MG PO TABS: 60.0000 mg | ORAL_TABLET | Freq: Three times a day (TID) | ORAL | 1 refills | Status: DC | PRN

## 2021-10-15 NOTE — Patient Instructions (Signed)
Please review the attached list of medications and notify my office if there are any errors.   Please bring all of your medications to every appointment so we can make sure that our medication list is the same as yours.   It is recommended to engage in 150 minutes of moderate exercise every week.

## 2021-10-15 NOTE — Progress Notes (Signed)
Complete physical exam   Patient: Russell Pineda   DOB: Jun 17, 1966   55 y.o. Male  MRN: [id number] Visit Date: 10/15/2021  Today's healthcare provider: Lelon Huh, MD   Chief Complaint  Patient presents with   Annual Exam   Hyperlipidemia    Subjective    Clearance Russell Pineda is a 55 y.o. male who presents today for a complete physical exam.  He reports consuming a general diet. Home exercise routine includes walking. He generally feels fairly well. He reports sleeping fairly well. He does have additional problems to discuss today.  HPI  Lipid/Cholesterol, Follow-up  Last lipid panel Other pertinent labs  Lab Results  Component Value Date   CHOL 203 (H) 10/29/2020   HDL 57 10/29/2020   LDLCALC 124 (H) 10/29/2020   TRIG 126 10/29/2020   CHOLHDL 3.6 10/29/2020   Lab Results  Component Value Date   ALT 32 10/29/2020   AST 31 10/29/2020   PLT 194 04/27/2021   TSH 2.51 09/30/2017     He was last seen for this 11 months ago.  Management since that visit includes continue lifestyle modification.  He reports good compliance with treatment. He is not having side effects.   Symptoms: No chest pain No chest pressure/discomfort  No dyspnea No lower extremity edema  No numbness or tingling of extremity No orthopnea  No palpitations No paroxysmal nocturnal dyspnea  No speech difficulty No syncope   Current diet: well balanced Current exercise: walking  The 10-year ASCVD risk score (Arnett DK, et al., 2019) is: 4.4%  ---------------------------------------------------------------------------------------------------   Follow up for Hyperuricemia:  The patient was last seen for this 11 months ago. Changes made at last visit include none.  He reports good compliance with treatment. He feels that condition is Unchanged. Has not had any gout flares since starting allopurinol He is not having side effects.    -----------------------------------------------------------------------------------------  He also reports he has been having more episodes of feeling anxious with heart racing and having trouble focusing, usually related to stressful tasks at work. He previously saw a psychiatrist and was prescribed and anxiety medication that he did not tolerate.   Past Medical History:  Diagnosis Date   Acute stress disorder    Allergic rhinitis    Esophageal reflux    Fatty liver    Gout 2016   Hearing loss 2001   Hypercholesterolemia    Hyperlipidemia    Lipoma of abdominal wall    Lumbago    Multiple benign nevi    OSA (obstructive sleep apnea)    CPAP   Past Surgical History:  Procedure Laterality Date   CHOLECYSTECTOMY N/A 09/12/2019   Procedure: LAPAROSCOPIC CHOLECYSTECTOMY;  Surgeon: Fredirick Maudlin, MD;  Location: [facility]S;  Service: General;  Laterality: N/A;   COLONOSCOPY WITH PROPOFOL N/A 07/27/2019   Procedure: COLONOSCOPY WITH PROPOFOL;  Surgeon: Virgel Manifold, MD;  Location: [facility];  Service: Endoscopy;  Laterality: N/A;   ESOPHAGOGASTRODUODENOSCOPY (EGD) WITH PROPOFOL N/A 07/27/2019   Procedure: ESOPHAGOGASTRODUODENOSCOPY (EGD) WITH PROPOFOL;  Surgeon: Virgel Manifold, MD;  Location: [facility];  Service: Endoscopy;  Laterality: N/A;   HERNIA REPAIR     orchidectomy     left   TONSILLECTOMY     WISDOM TOOTH EXTRACTION     Social History   Socioeconomic History   Marital status: Married    Spouse name: Not on file   Number of children: Not on file   Years of education: HS  Grad   Highest education level: Not on file  Occupational History   Occupation: Full-Time    Comment: [employer]  Tobacco Use   Smoking status: Never   Smokeless tobacco: Former    Types: Chew    Quit date: 11/24/2004  Vaping Use   Vaping Use: Never used  Substance and Sexual Activity   Alcohol use: Yes    Alcohol/week: 2.0 standard drinks    Types: 2 Cans of beer per  week    Comment: occasional   Drug use: No   Sexual activity: Yes  Other Topics Concern   Not on file  Social History Narrative   Not on file   Social Determinants of Health   Financial Resource Strain: Not on file  Food Insecurity: Not on file  Transportation Needs: Not on file  Physical Activity: Not on file  Stress: Not on file  Social Connections: Not on file  Intimate Partner Violence: Not on file   Family Status  Relation Name Status   Mother  Deceased at age 71       Emphysema   Father  Deceased at age 52       MI   MGM  Deceased   PGF  Deceased   Sister  Insurance risk surveyor   Sister  Alive   Sister  Alive   Psychiatrist  (Not Specified)   MGF  (Not Specified)   Family History  Problem Relation Age of Onset   Emphysema Mother    Heart disease Father    Heart attack Father    Cancer Maternal Grandmother        Bone   Cancer Paternal Grandfather    Alcohol abuse Paternal Uncle    Alcohol abuse Maternal Grandfather    Allergies  Allergen Reactions   Niacin Other (See Comments)    Flushing    Patient Care Team: Birdie Sons, MD as PCP - General (Family Medicine)   Medications: Outpatient Medications Prior to Visit  Medication Sig   allopurinol (ZYLOPRIM) 100 MG tablet TAKE 1 TABLET (100 MG TOTAL) BY MOUTH 2 (TWO) TIMES DAILY.   amitriptyline (ELAVIL) 25 MG tablet TAKE 1 TABLET (25 MG TOTAL) BY MOUTH AT BEDTIME AS NEEDED.   fexofenadine (ALLEGRA) 180 MG tablet Take 180 mg by mouth daily.   pantoprazole (PROTONIX) 40 MG tablet Take 1 tablet (40 mg total) by mouth daily.   psyllium (HYDROCIL/METAMUCIL) 95 % PACK Take 1 packet by mouth daily.   [DISCONTINUED] scopolamine (TRANSDERM-SCOP, 1.5 MG,) 1 MG/3DAYS Place 1 patch (1.5 mg total) onto the skin every 3 (three) days. (Patient not taking: Reported on 10/15/2021)   No facility-administered medications prior to visit.    Review of Systems  Constitutional:  Negative for appetite change, chills,  fatigue and fever.  HENT:  Negative for congestion, ear pain, hearing loss, nosebleeds and trouble swallowing.   Eyes:  Negative for pain and visual disturbance.  Respiratory:  Negative for cough, chest tightness, shortness of breath and wheezing.   Cardiovascular:  Negative for chest pain, palpitations and leg swelling.  Gastrointestinal:  Positive for abdominal pain. Negative for blood in stool, constipation, diarrhea, nausea and vomiting.  Endocrine: Negative for polydipsia, polyphagia and polyuria.  Genitourinary:  Negative for dysuria and flank pain.  Musculoskeletal:  Negative for arthralgias, back pain, joint swelling, myalgias and neck stiffness.  Skin:  Negative for color change, rash and wound.  Neurological:  Negative for dizziness, tremors, seizures, speech  difficulty, weakness, light-headedness and headaches.  Psychiatric/Behavioral:  Negative for behavioral problems, confusion, decreased concentration, dysphoric mood and sleep disturbance. The patient is not nervous/anxious.   All other systems reviewed and are negative.    Objective    BP 132/85 (BP Location: Right Arm, Patient Position: Sitting, Cuff Size: Normal)   Pulse 77   Temp 98.6 F (37 C) (Oral)   Ht 6' (1.829 m)   Wt 249 lb 1.6 oz (113 kg)   SpO2 99%   BMI 33.78 kg/m    Physical Exam    General Appearance:    Mildly obese male. Alert, cooperative, in no acute distress, appears stated age  Head:    Normocephalic, without obvious abnormality, atraumatic  Eyes:    PERRL, conjunctiva/corneas clear, EOM's intact, fundi    benign, both eyes       Ears:    Normal TM's and external ear canals, both ears  Neck:   Supple, symmetrical, trachea midline, no adenopathy;       thyroid:  No enlargement/tenderness/nodules; no carotid   bruit or JVD  Back:     Symmetric, no curvature, ROM normal, no CVA tenderness  Lungs:     Clear to auscultation bilaterally, respirations unlabored  Chest wall:    No tenderness or  deformity  Heart:    Normal heart rate. Normal rhythm. No murmurs, rubs, or gallops.  S1 and S2 normal  Abdomen:     Soft, non-tender, bowel sounds active all four quadrants,    no masses, no organomegaly  Genitalia:    deferred  Rectal:    deferred  Extremities:   All extremities are intact. No cyanosis or edema  Pulses:   2+ and symmetric all extremities  Skin:   Skin color, texture, turgor normal, no rashes or lesions  Lymph nodes:   Cervical, supraclavicular, and axillary nodes normal  Neurologic:   CNII-XII intact. Normal strength, sensation and reflexes      throughout     Last depression screening scores PHQ 2/9 Scores 05/06/2021 10/29/2020 09/13/2018  PHQ - 2 Score 0 0 0  PHQ- 9 Score 2 1 -   Last fall risk screening Fall Risk  05/06/2021  Falls in the past year? 0  Number falls in past yr: 0  Injury with Fall? 0  Risk for fall due to : No Fall Risks  Follow up Falls evaluation completed   Last Audit-C alcohol use screening Alcohol Use Disorder Test (AUDIT) 05/06/2021  1. How often do you have a drink containing alcohol? 3  2. How many drinks containing alcohol do you have on a typical day when you are drinking? 0  3. How often do you have six or more drinks on one occasion? 1  AUDIT-C Score 4   A score of 3 or more in women, and 4 or more in men indicates increased risk for alcohol abuse, EXCEPT if all of the points are from question 1   No results found for any visits on 10/15/21.  Assessment & Plan    Routine Health Maintenance and Physical Exam  Exercise Activities and Dietary recommendations  Goals   None     Immunization History  Administered Date(s) Administered   Influenza Split 10/29/2012   Tdap 02/26/2010, 10/29/2020   Zoster Recombinat (Shingrix) 10/29/2020    Health Maintenance  Topic Date Due   HIV Screening  Never done   Zoster Vaccines- Shingrix (2 of 2) 12/24/2020   COLONOSCOPY (Pts 45-17yrs Insurance coverage will  need to be confirmed)   07/26/2029   TETANUS/TDAP  10/29/2030   Hepatitis C Screening  Completed   Pneumococcal Vaccine 69-12 Years old  Aged Out   HPV VACCINES  Aged Out   INFLUENZA VACCINE  Discontinued   COVID-19 Vaccine  Discontinued    Discussed health benefits of physical activity, and encouraged him to engage in regular exercise appropriate for his age and condition.  1. Hyperlipemia, mixed Diet controlled.   2. Primary insomnia Doing well with amitriptyline.   3. Situational stress Try prn. propranolol (INDERAL) 60 MG tablet; Take 1 tablet (60 mg total) by mouth every 8 (eight) hours as needed (for anxiety).  Dispense: 20 tablet; Refill: 1  4. Hyperuricemia No gout flares since being on allopurinol which he is tolerating well  - Uric acid  5. Annual physical exam  - CBC - Comprehensive metabolic panel - Lipid panel - Hemoglobin A1c   Is due for Shingrix #2 which he wants to have after Thanksgiving.   He declined flu vaccine.       The entirety of the information documented in the History of Present Illness, Review of Systems and Physical Exam were personally obtained by me. Portions of this information were initially documented by the CMA and reviewed by me for thoroughness and accuracy.     Lelon Huh, MD  [facility] [phone] (phone) [phone] (fax)  [place]

## 2021-10-18 ENCOUNTER — Other Ambulatory Visit: Payer: Self-pay | Admitting: Family Medicine

## 2021-10-21 DIAGNOSIS — R739 Hyperglycemia, unspecified: Secondary | ICD-10-CM | POA: Diagnosis not present

## 2021-10-21 DIAGNOSIS — Z136 Encounter for screening for cardiovascular disorders: Secondary | ICD-10-CM | POA: Diagnosis not present

## 2021-10-21 DIAGNOSIS — E790 Hyperuricemia without signs of inflammatory arthritis and tophaceous disease: Secondary | ICD-10-CM | POA: Diagnosis not present

## 2021-10-21 DIAGNOSIS — Z0000 Encounter for general adult medical examination without abnormal findings: Secondary | ICD-10-CM | POA: Diagnosis not present

## 2021-10-22 LAB — LIPID PANEL
Chol/HDL Ratio: 4.2 ratio (ref 0.0–5.0)
Cholesterol, Total: 230 mg/dL — ABNORMAL HIGH (ref 100–199)
HDL: 55 mg/dL (ref >39)
LDL Chol Calc (NIH): 147 mg/dL — ABNORMAL HIGH (ref 0–99)
Triglycerides: 157 mg/dL — ABNORMAL HIGH (ref 0–149)
VLDL Cholesterol Cal: 28 mg/dL (ref 5–40)

## 2021-10-22 LAB — CBC
Hematocrit: 49.4 % (ref 37.5–51.0)
Hemoglobin: 17.3 g/dL (ref 13.0–17.7)
MCH: 32.6 pg (ref 26.6–33.0)
MCHC: 35 g/dL (ref 31.5–35.7)
MCV: 93 fL (ref 79–97)
Platelets: 211 10*3/uL (ref 150–450)
RBC: 5.31 x10E6/uL (ref 4.14–5.80)
RDW: 11.8 % (ref 11.6–15.4)
WBC: 5.4 10*3/uL (ref 3.4–10.8)

## 2021-10-22 LAB — COMPREHENSIVE METABOLIC PANEL
ALT: 29 IU/L (ref 0–44)
AST: 31 IU/L (ref 0–40)
Albumin/Globulin Ratio: 2.3 — ABNORMAL HIGH (ref 1.2–2.2)
Albumin: 4.6 g/dL (ref 3.8–4.9)
Alkaline Phosphatase: 38 IU/L — ABNORMAL LOW (ref 44–121)
BUN/Creatinine Ratio: 20 (ref 9–20)
BUN: 18 mg/dL (ref 6–24)
Bilirubin Total: 0.5 mg/dL (ref 0.0–1.2)
CO2: 21 mmol/L (ref 20–29)
Calcium: 9.6 mg/dL (ref 8.7–10.2)
Chloride: 103 mmol/L (ref 96–106)
Creatinine, Ser: 0.89 mg/dL (ref 0.76–1.27)
Globulin, Total: 2 g/dL (ref 1.5–4.5)
Glucose: 96 mg/dL (ref 70–99)
Potassium: 4.4 mmol/L (ref 3.5–5.2)
Sodium: 141 mmol/L (ref 134–144)
Total Protein: 6.6 g/dL (ref 6.0–8.5)
eGFR: 102 mL/min/{1.73_m2} (ref >59)

## 2021-10-22 LAB — HEMOGLOBIN A1C
Est. average glucose Bld gHb Est-mCnc: 108 mg/dL
Hgb A1c MFr Bld: 5.4 % (ref 4.8–5.6)

## 2021-10-22 LAB — URIC ACID: Uric Acid: 5.5 mg/dL (ref 3.8–8.4)

## 2021-10-29 ENCOUNTER — Ambulatory Visit: Payer: BC Managed Care – PPO | Admitting: Family Medicine

## 2021-10-30 ENCOUNTER — Encounter: Payer: BC Managed Care – PPO | Admitting: Family Medicine

## 2021-11-06 ENCOUNTER — Telehealth: Payer: Self-pay

## 2021-11-06 NOTE — Telephone Encounter (Signed)
Do you have these forms?  Thanks,   -Mickel Baas

## 2021-11-06 NOTE — Telephone Encounter (Signed)
Copied from [place] [id number]. Topic: General - Other >> Nov 06, 2021 10:32 AM Leward Quan A wrote: Reason for CRM: Patient called in to inform Dr Sabino Snipes nurse that he left a form to be completed and faxed on his last visit for his Physical but they have yet to receive this form and it have been a few weeks. Please call patient and inform when done   Ph# [phone]

## 2021-11-08 NOTE — Telephone Encounter (Signed)
I filled out those forms when he had his labs down in November and sent to Medical Records to be faxed.

## 2021-12-04 ENCOUNTER — Other Ambulatory Visit: Payer: Self-pay | Admitting: Family Medicine

## 2021-12-04 NOTE — Telephone Encounter (Signed)
Requested Prescriptions  Pending Prescriptions Disp Refills   amitriptyline (ELAVIL) 25 MG tablet [Pharmacy Med Name: AMITRIPTYLINE HCL 25 MG TAB] 90 tablet 1    Sig: TAKE 1 TABLET BY MOUTH AT BEDTIME AS NEEDED.     Psychiatry:  Antidepressants - Heterocyclics (TCAs) Passed - 12/04/2021  1:29 AM      Passed - Valid encounter within last 6 months    Recent Outpatient Visits          1 month ago Annual physical exam   [place], [place]   4 months ago Gastroesophageal reflux disease, unspecified whether esophagitis present   [facility] Tally Joe T, FNP   7 months ago Chest pain, unspecified type   [facility] Birdie Sons, MD   1 year ago Annual physical exam   [facility] Birdie Sons, MD   1 year ago allergic rhinitis   [place], [place], [place]

## 2022-02-05 ENCOUNTER — Encounter: Payer: Self-pay | Admitting: Physician Assistant

## 2022-02-05 ENCOUNTER — Ambulatory Visit: Payer: Self-pay

## 2022-02-05 ENCOUNTER — Ambulatory Visit
Admission: RE | Admit: 2022-02-05 | Discharge: 2022-02-05 | Disposition: A | Discharge status: Home / Self Care | Payer: BC Managed Care – PPO | Attending: Physician Assistant | Admitting: Physician Assistant

## 2022-02-05 ENCOUNTER — Ambulatory Visit
Admission: RE | Admit: 2022-02-05 | Discharge: 2022-02-05 | Disposition: A | Discharge status: Home / Self Care | Payer: BC Managed Care – PPO | Source: Ambulatory Visit | Attending: Physician Assistant | Admitting: Physician Assistant

## 2022-02-05 ENCOUNTER — Other Ambulatory Visit: Payer: Self-pay

## 2022-02-05 ENCOUNTER — Ambulatory Visit (INDEPENDENT_AMBULATORY_CARE_PROVIDER_SITE_OTHER): Payer: BC Managed Care – PPO | Admitting: Physician Assistant

## 2022-02-05 VITALS — BP 128/81 | HR 67 | Resp 16 | Wt 253.1 lb

## 2022-02-05 DIAGNOSIS — M542 Cervicalgia: Secondary | ICD-10-CM

## 2022-02-05 DIAGNOSIS — M5011 Cervical disc disorder with radiculopathy,  high cervical region: Secondary | ICD-10-CM | POA: Diagnosis not present

## 2022-02-05 MED ORDER — CYCLOBENZAPRINE HCL 5 MG PO TABS: 5.0000 mg | ORAL_TABLET | Freq: Three times a day (TID) | ORAL | 0 refills | Status: DC | PRN

## 2022-02-05 MED ORDER — IBUPROFEN 600 MG PO TABS: 600.0000 mg | ORAL_TABLET | Freq: Three times a day (TID) | ORAL | 0 refills | Status: DC | PRN

## 2022-02-05 NOTE — Telephone Encounter (Signed)
?  Chief Complaint: sShoulder and arm pain  ?Symptoms: IBID ?Frequency: since yesterday ?Pertinent Negatives: Patient denies fever, chest pain ?Disposition: '[]'$ ED /'[]'$ Urgent Care (no appt availability in office) / '[x]'$ Appointment(In office/virtual)/ '[]'$  [facility]/ '[]'$ Home Care/ '[]'$ Refused Recommended Disposition /'[]'$ [facility]/ '[]'$  Follow-up with PCP ?Additional Notes: Pt is unsure of why he is having this pain. He did do a little work over the weekend. Aleve did help pain. ? ? ? ? ?Reason for Disposition ? [1] MODERATE pain (e.g., interferes with normal activities) AND [2] present > 3 days ? ?Answer Assessment - Initial Assessment Questions ?1. ONSET: "When did the pain start?" ?    Started yesterday ?2. LOCATION: "Where is the pain located?" ?    Right side - started neck ?3. PAIN: "How bad is the pain?" (Scale 1-10; or mild, moderate, severe) ?  - MILD (1-3): doesn't interfere with normal activities ?  - MODERATE (4-7): interferes with normal activities (e.g., work or school) or awakens from sleep ?  - SEVERE (8-10): excruciating pain, unable to do any normal activities, unable to move arm at all due to pain ?    Pain was 6-7/10 took aleve now a 3 ?4. WORK OR EXERCISE: "Has there been any recent work or exercise that involved this part of the body?" ?    Over the weekend ?5. CAUSE: "What do you think is causing the shoulder pain?" ?    Maybe sleeping wrong -  ?6. OTHER SYMPTOMS: "Do you have any other symptoms?" (e.g., neck pain, swelling, rash, fever, numbness, weakness) ?    no ?7. PREGNANCY: "Is there any chance you are pregnant?" "When was your last menstrual period?" ?    na ? ?Protocols used: Shoulder Pain-A-AH ? ?

## 2022-02-05 NOTE — Progress Notes (Signed)
?  ? ? ?Established patient visit ? ? ?Patient: Russell Pineda   DOB: 11/10/1966   56 y.o. Male  MRN: [id number] ?Visit Date: 02/05/2022 ? ?Today's healthcare provider: Mardene Speak, PA-C  ? ?Chief Complaint  ?Patient presents with  ? Neck Pain  ? ?Subjective  ?  ?HPI ? ?  ?HPI   ? ? Neck Pain   ?This is a new problem.  There was not an injury that may have caused the pain.  Recent episode started yesterday.  The problem has been unchanged since onset.   Pain is present in the  right posterior neck.  The pain radiates to back and right arm.  Severity of the pain is severe.  The symptoms are aggravated by turning right and turning left.  Symptoms are relieved by nothing.  Treatments: NSAIDs.  Treatment provided moderate relief.  Chest pain: Absent.  Fever: Absent.  Headaches: Absent.  Joint pains: Absent.  Numbness: Absent.  Sore throat: Absent.  Swallowing problems: Absent.  Tingling: Absent.  Weakness: Absent. ? ?  ?  ?Last edited by Minette Headland, CMA on 02/05/2022  9:41 AM.  ?  ? ? ?NECK PAIN , right ?Status: Severe before aleve, 3/10 after taking aleve. Patient has hx of fall from ladder (right shoulder) in 2020 wo fracture ?Location:Right ?Duration:days ?Severity: 6/10 ?Quality: sharp ?Frequency: with movements ?Radiation: R arm ?Aggravating factors: lifting and movement ?Alleviating factors: NSAIDs ?Weakness:  no ?Paresthesias / decreased sensation:  no  ?Fevers:  no ? ?SHOULDER PAIN, right ?Duration: days ?Involved shoulder: right ?Mechanism of injury: unknown ?Location: medial ?Onset:sudden ?Severity: 7/10  ?Quality: sharp ?Frequency: intermittent ?Aggravating factors: lifting and movement  ?Alleviating factors: NSAID  ?Status: worse ?Treatments attempted: NSAID  ?Relief with NSAIDs?:  significant ?Weakness: no ?Numbness: no ?Decreased grip strength: no ?Redness: no ?Swelling: no ?Bruising: no ?Fevers: no ? ?Medications: ?Outpatient Medications Prior to Visit  ?Medication Sig  ? allopurinol (ZYLOPRIM)  100 MG tablet TAKE 1 TABLET BY MOUTH TWICE A DAY  ? amitriptyline (ELAVIL) 25 MG tablet TAKE 1 TABLET BY MOUTH AT BEDTIME AS NEEDED.  ? fexofenadine (ALLEGRA) 180 MG tablet Take 180 mg by mouth daily.  ? pantoprazole (PROTONIX) 40 MG tablet Take 1 tablet (40 mg total) by mouth daily.  ? propranolol (INDERAL) 60 MG tablet Take 1 tablet (60 mg total) by mouth every 8 (eight) hours as needed (for anxiety).  ? psyllium (HYDROCIL/METAMUCIL) 95 % PACK Take 1 packet by mouth daily.  ? ?No facility-administered medications prior to visit.  ? ? ?Review of Systems  ?Constitutional:  Positive for activity change. Negative for fever.  ?Eyes: Negative.   ?Respiratory: Negative.    ?Cardiovascular: Negative.   ?Gastrointestinal: Negative.   ?Musculoskeletal:  Positive for neck pain.  ?Skin: Negative.   ?Neurological: Negative.   ?All other systems reviewed and are negative. ? ?  Objective  ?  ?BP 128/81   Pulse 67   Resp 16   Wt 253 lb 1.6 oz (114.8 kg)   SpO2 99%   BMI 34.33 kg/m?  ? ? ?Physical Exam ?Vitals and nursing note reviewed.  ?Constitutional:   ?   Appearance: Normal appearance. He is obese.  ?HENT:  ?   Head: Normocephalic and atraumatic.  ?Cardiovascular:  ?   Rate and Rhythm: Normal rate and regular rhythm.  ?   Pulses: Normal pulses.  ?   Heart sounds: Normal heart sounds.  ?Pulmonary:  ?   Effort: Pulmonary effort is normal.  ?  Breath sounds: Normal breath sounds.  ?Abdominal:  ?   General: Abdomen is flat. Bowel sounds are normal.  ?   Palpations: Abdomen is soft.  ?Neurological:  ?   Mental Status: He is alert and oriented to person, place, and time.  ?Psychiatric:     ?   Behavior: Behavior normal.     ?   Thought Content: Thought content normal.     ?   Judgment: Judgment normal.  ?  ?   Neck: right ?   Inspection: no swelling, ecchymosis, erythema or step off deformity. ?   Tenderness to Palpation: midline C5 - C7 ?   Range of Motion:  Full Range of Motion  ?   Pain with cervical flexion and cervical  rotations to the sides  ? ?   Shoulder: right ?   Inspection:  no swelling, ecchymosis, erythema or step off deformity.  ?   Tenderness to Palpation: ?   Acromion: no ?   AC joint:no ?   Clavicle: no ?   Bicipital groove: no ?   Scapular spine: no ?   Coracoid process: no ?   Humeral head: no ?   Supraspinatus tendon: no ?   Range of Motion:  Full Range of Motion bilaterally ?   Abduction:Normal ?   Adduction: Normal ?   Flexion: Normal ?   Extension: Normal ?   Internal rotation: Normal ?   External rotation: Normal ?   Painful arc: no ? ?   Muscle Strength: 5/5 bilaterally ?   Flexion: Normal8 ?   Extension: Normal ?   Abduction: Normal ?   Adduction: Normal ?   External rotation: Normal ?   Internal rotation: Normal ? ?   Neuro: Sensation WNL. and Upper extremity reflexes WNL. ? ?   Special Tests:  ?   Neer sign: Negative ?   Hawkins sign: Negative ?   Cross arm adduction: Negative ?   ? Assessment & Plan  ?  ? ?1. Neck pain ? ?- DG Cervical Spine Complete; Future  ?- ibuprofen (ADVIL) 600 MG tablet; Take 1 tablet (600 mg total) by mouth every 8 (eight) hours as needed.  Dispense: 45 tablet; Refill: 0 with meals and PPI OTC ?- Patient decided against Ketorolac injection. Patient cannot take Flexeril due to drug interaction with Amitriptyline. ?- FU in a month and transfer his services to his PCP ?  The patient was advised to call back or seek an in-person evaluation if the symptoms worsen or if the condition fails to improve as anticipated. ? ?I discussed the assessment and treatment plan with the patient. The patient was provided an opportunity to ask questions and all were answered. The patient agreed with the plan and demonstrated an understanding of the instructions. ? ?The entirety of the information documented in the History of Present Illness, Review of Systems and Physical Exam were personally obtained by me. Portions of this information were initially documented by the CMA and reviewed by me for  thoroughness and accuracy.   ? ?[employer] as a Education administrator for [employer], PA-C.,have documented all relevant documentation on the behalf of Mardene Speak, PA-C,as directed by  [employer], PA-C while in the presence of [employer], PA-C.  ? ?Mardene Speak, PA-C  ?[place] ?[phone] (phone) ?[phone] (fax) ? ?[facility]  ?

## 2022-02-16 NOTE — Progress Notes (Deleted)
? ?Established Patient Office Visit ? ?Subjective:  ?Patient ID: Russell Pineda, male    DOB: August 24, 1966  Age: 56 y.o. MRN: [id number] ? ?CC: No chief complaint on file. ? ? ?HPI ?Russell Pineda presents for *** ?1. Neck pain ?  ?- DG Cervical Spine Complete; Future  ?- ibuprofen (ADVIL) 600 MG tablet; Take 1 tablet (600 mg total) by mouth every 8 (eight) hours as needed.  Dispense: 45 tablet; Refill: 0 with meals and PPI OTC ?- Patient decided against Ketorolac injection. Patient cannot take Flexeril due to drug interaction with Amitriptyline. ?- FU in a month and transfer his services to his PCP ?  ? The patient was advised to call back or seek an in-person evaluation if the symptoms worsen or if the condition fails to improve as anticipated. ? ?Past Medical History:  ?Diagnosis Date  ? Acute stress disorder   ? Allergic rhinitis   ? Esophageal reflux   ? Fatty liver   ? Gout 2016  ? Hearing loss 2001  ? Hypercholesterolemia   ? Hyperlipidemia   ? Lipoma of abdominal wall   ? Lumbago   ? Multiple benign nevi   ? OSA (obstructive sleep apnea)   ? CPAP  ? ? ?Past Surgical History:  ?Procedure Laterality Date  ? CHOLECYSTECTOMY N/A 09/12/2019  ? Procedure: LAPAROSCOPIC CHOLECYSTECTOMY;  Surgeon: Fredirick Maudlin, MD;  Location: [facility] ORS;  Service: General;  Laterality: N/A;  ? COLONOSCOPY WITH PROPOFOL N/A 07/27/2019  ? Procedure: COLONOSCOPY WITH PROPOFOL;  Surgeon: Virgel Manifold, MD;  Location: [facility] ENDOSCOPY;  Service: Endoscopy;  Laterality: N/A;  ? ESOPHAGOGASTRODUODENOSCOPY (EGD) WITH PROPOFOL N/A 07/27/2019  ? Procedure: ESOPHAGOGASTRODUODENOSCOPY (EGD) WITH PROPOFOL;  Surgeon: Virgel Manifold, MD;  Location: [facility] ENDOSCOPY;  Service: Endoscopy;  Laterality: N/A;  ? HERNIA REPAIR    ? orchidectomy    ? left  ? TONSILLECTOMY    ? WISDOM TOOTH EXTRACTION    ? ? ?Family History  ?Problem Relation Age of Onset  ? Emphysema Mother   ? Heart disease Father   ? Heart attack Father   ? Cancer Maternal  Grandmother   ?     Bone  ? Cancer Paternal Grandfather   ? Alcohol abuse Paternal Uncle   ? Alcohol abuse Maternal Grandfather   ? ? ?Social History  ? ?Socioeconomic History  ? Marital status: Married  ?  Spouse name: Not on file  ? Number of children: Not on file  ? Years of education: HS Grad  ? Highest education level: Not on file  ?Occupational History  ? Occupation: Full-Time  ?  Comment: [employer]  ?Tobacco Use  ? Smoking status: Never  ? Smokeless tobacco: Former  ?  Types: Chew  ?  Quit date: 11/24/2004  ?Vaping Use  ? Vaping Use: Never used  ?Substance and Sexual Activity  ? Alcohol use: Yes  ?  Alcohol/week: 2.0 standard drinks  ?  Types: 2 Cans of beer per week  ?  Comment: occasional  ? Drug use: No  ? Sexual activity: Yes  ?Other Topics Concern  ? Not on file  ?Social History Narrative  ? Not on file  ? ?Social Determinants of Health  ? ?Financial Resource Strain: Not on file  ?Food Insecurity: Not on file  ?Transportation Needs: Not on file  ?Physical Activity: Not on file  ?Stress: Not on file  ?Social Connections: Not on file  ?Intimate Partner Violence: Not on file  ? ? ?Outpatient  Medications Prior to Visit  ?Medication Sig Dispense Refill  ? allopurinol (ZYLOPRIM) 100 MG tablet TAKE 1 TABLET BY MOUTH TWICE A DAY 180 tablet 4  ? amitriptyline (ELAVIL) 25 MG tablet TAKE 1 TABLET BY MOUTH AT BEDTIME AS NEEDED. 90 tablet 1  ? fexofenadine (ALLEGRA) 180 MG tablet Take 180 mg by mouth daily.    ? ibuprofen (ADVIL) 600 MG tablet Take 1 tablet (600 mg total) by mouth every 8 (eight) hours as needed. 45 tablet 0  ? pantoprazole (PROTONIX) 40 MG tablet Take 1 tablet (40 mg total) by mouth daily. 90 tablet 4  ? propranolol (INDERAL) 60 MG tablet Take 1 tablet (60 mg total) by mouth every 8 (eight) hours as needed (for anxiety). 20 tablet 1  ? psyllium (HYDROCIL/METAMUCIL) 95 % PACK Take 1 packet by mouth daily.    ? ?No facility-administered medications prior to visit.  ? ? ?Allergies  ?Allergen  Reactions  ? Niacin Other (See Comments)  ?  Flushing  ? ? ?ROS ?Review of Systems ? ?  ?Objective:  ?  ?Physical Exam ? ?There were no vitals taken for this visit. ?Wt Readings from Last 3 Encounters:  ?02/05/22 253 lb 1.6 oz (114.8 kg)  ?10/15/21 249 lb 1.6 oz (113 kg)  ?07/09/21 241 lb 14.4 oz (109.7 kg)  ? ? ? ?Health Maintenance Due  ?Topic Date Due  ? HIV Screening  Never done  ? Zoster Vaccines- Shingrix (2 of 2) 12/24/2020  ? ? ?There are no preventive care reminders to display for this patient. ? ?Lab Results  ?Component Value Date  ? TSH 2.51 09/30/2017  ? ?Lab Results  ?Component Value Date  ? WBC 5.4 10/21/2021  ? HGB 17.3 10/21/2021  ? HCT 49.4 10/21/2021  ? MCV 93 10/21/2021  ? PLT 211 10/21/2021  ? ?Lab Results  ?Component Value Date  ? NA 141 10/21/2021  ? K 4.4 10/21/2021  ? CO2 21 10/21/2021  ? GLUCOSE 96 10/21/2021  ? BUN 18 10/21/2021  ? CREATININE 0.89 10/21/2021  ? BILITOT 0.5 10/21/2021  ? ALKPHOS 38 (L) 10/21/2021  ? AST 31 10/21/2021  ? ALT 29 10/21/2021  ? PROT 6.6 10/21/2021  ? ALBUMIN 4.6 10/21/2021  ? CALCIUM 9.6 10/21/2021  ? ANIONGAP 7 04/27/2021  ? EGFR 102 10/21/2021  ? ?Lab Results  ?Component Value Date  ? CHOL 230 (H) 10/21/2021  ? ?Lab Results  ?Component Value Date  ? HDL 55 10/21/2021  ? ?Lab Results  ?Component Value Date  ? [place] 147 (H) 10/21/2021  ? ?Lab Results  ?Component Value Date  ? TRIG 157 (H) 10/21/2021  ? ?Lab Results  ?Component Value Date  ? CHOLHDL 4.2 10/21/2021  ? ?Lab Results  ?Component Value Date  ? HGBA1C 5.4 10/21/2021  ? ? ?  ?Assessment & Plan:  ? ?Problem List Items Addressed This Visit   ?None ? ? ?No orders of the defined types were placed in this encounter. ? ? ?Follow-up: No follow-ups on file.  ? ? ?Mardene Speak, PA-C ?

## 2022-02-19 ENCOUNTER — Ambulatory Visit: Payer: BC Managed Care – PPO | Admitting: Physician Assistant

## 2022-03-06 DIAGNOSIS — H5213 Myopia, bilateral: Secondary | ICD-10-CM | POA: Diagnosis not present

## 2022-03-06 DIAGNOSIS — H40013 Open angle with borderline findings, low risk, bilateral: Secondary | ICD-10-CM | POA: Diagnosis not present

## 2022-05-15 ENCOUNTER — Other Ambulatory Visit: Payer: Self-pay | Admitting: Family Medicine

## 2022-05-15 DIAGNOSIS — K219 Gastro-esophageal reflux disease without esophagitis: Secondary | ICD-10-CM

## 2022-07-09 ENCOUNTER — Telehealth: Payer: BC Managed Care – PPO | Admitting: Nurse Practitioner

## 2022-07-09 DIAGNOSIS — J0100 Acute maxillary sinusitis, unspecified: Secondary | ICD-10-CM | POA: Diagnosis not present

## 2022-07-09 MED ORDER — AMOXICILLIN-POT CLAVULANATE 875-125 MG PO TABS: 1.0000 | ORAL_TABLET | Freq: Two times a day (BID) | ORAL | 0 refills | Status: DC

## 2022-07-09 NOTE — Progress Notes (Signed)

## 2022-07-26 ENCOUNTER — Other Ambulatory Visit: Payer: Self-pay | Admitting: Family Medicine

## 2022-10-15 ENCOUNTER — Encounter: Payer: Self-pay | Admitting: Family Medicine

## 2022-10-15 ENCOUNTER — Ambulatory Visit (INDEPENDENT_AMBULATORY_CARE_PROVIDER_SITE_OTHER): Payer: BC Managed Care – PPO | Admitting: Family Medicine

## 2022-10-15 VITALS — BP 129/84 | HR 81 | Temp 98.5°F | Resp 16

## 2022-10-15 DIAGNOSIS — J069 Acute upper respiratory infection, unspecified: Secondary | ICD-10-CM

## 2022-10-15 DIAGNOSIS — H66002 Acute suppurative otitis media without spontaneous rupture of ear drum, left ear: Secondary | ICD-10-CM | POA: Diagnosis not present

## 2022-10-15 LAB — POCT INFLUENZA A/B
Influenza A, POC: NEGATIVE
Influenza B, POC: NEGATIVE

## 2022-10-15 LAB — POC COVID19 BINAXNOW: SARS Coronavirus 2 Ag: NEGATIVE

## 2022-10-15 NOTE — Progress Notes (Signed)
I,Roshena L Chambers,acting as a scribe for Lelon Huh, MD.,have documented all relevant documentation on the behalf of Lelon Huh, MD,as directed by  Lelon Huh, MD while in the presence of Lelon Huh, MD.    Established patient visit   Patient: Russell Pineda   DOB: 05/03/1966   56 y.o. Male  MRN: [id number] Visit Date: 10/15/2022  Today's healthcare provider: Lelon Huh, MD   Chief Complaint  Patient presents with   URI   Subjective    URI  This is a new problem. Episode onset: 4- 5 days ago. The problem has been gradually worsening. There has been no fever. Associated symptoms include congestion, coughing (in the mornings), headaches, rhinorrhea and a sore throat. Pertinent negatives include no abdominal pain, chest pain, nausea, vomiting or wheezing. Treatments tried: NyQuil and Augmentin.    Patient starting taking Augmentin that was prescribed back in August for a previous sinus infection.   Medications: Outpatient Medications Prior to Visit  Medication Sig   allopurinol (ZYLOPRIM) 100 MG tablet TAKE 1 TABLET BY MOUTH TWICE A DAY   amitriptyline (ELAVIL) 25 MG tablet TAKE 1 TABLET BY MOUTH EVERY DAY AT BEDTIME AS NEEDED   amoxicillin-clavulanate (AUGMENTIN) 875-125 MG tablet Take 1 tablet by mouth 2 (two) times daily.   fexofenadine (ALLEGRA) 180 MG tablet Take 180 mg by mouth daily.   ibuprofen (ADVIL) 600 MG tablet Take 1 tablet (600 mg total) by mouth every 8 (eight) hours as needed.   pantoprazole (PROTONIX) 40 MG tablet TAKE 1 TABLET BY MOUTH EVERY DAY   propranolol (INDERAL) 60 MG tablet Take 1 tablet (60 mg total) by mouth every 8 (eight) hours as needed (for anxiety).   psyllium (HYDROCIL/METAMUCIL) 95 % PACK Take 1 packet by mouth daily.   No facility-administered medications prior to visit.    Review of Systems  Constitutional:  Positive for diaphoresis and fatigue. Negative for appetite change, chills and fever.  HENT:  Positive for  congestion, postnasal drip, rhinorrhea, sinus pressure and sore throat.   Respiratory:  Positive for cough (in the mornings). Negative for chest tightness, shortness of breath and wheezing.   Cardiovascular:  Negative for chest pain and palpitations.  Gastrointestinal:  Negative for abdominal pain, nausea and vomiting.  Neurological:  Positive for headaches.       Objective    BP 129/84 (BP Location: Left Arm, Patient Position: Sitting, Cuff Size: Large)   Pulse 81   Temp 98.5 F (36.9 C) (Oral)   Resp 16   SpO2 96% Comment: room air   Physical Exam  General Appearance:    Obese male, alert, cooperative, in no acute distress  HENT:   NP inflamed and boggy, mild erythema left TM. Yellow post nasal discharge. No cervical LAD  Eyes:    PERRL, conjunctiva/corneas clear, EOM's intact       Lungs:     Clear to auscultation bilaterally, respirations unlabored  Heart:    Normal heart rate. Normal rhythm. No murmurs, rubs, or gallops.    Neurologic:   Awake, alert, oriented x 3. No apparent focal neurological           defect.       Results for orders placed or performed in visit on 10/15/22  POCT Influenza A/B  Result Value Ref Range   Influenza A, POC Negative Negative   Influenza B, POC Negative Negative  POC COVID-19  Result Value Ref Range   SARS Coronavirus 2 Ag Negative Negative  Assessment & Plan     1. Upper respiratory tract infection, unspecified type   2. Non-recurrent acute suppurative otitis media of left ear without spontaneous rupture of tympanic membrane He does have most of the Augmentin prescription left that was prescribed in August. He started taking it yesterday and can take for an additional 6 days. He can also take his prescription ibuprofen.   Call if symptoms change or if not rapidly improving.        The entirety of the information documented in the History of Present Illness, Review of Systems and Physical Exam were personally obtained by me.  Portions of this information were initially documented by the CMA and reviewed by me for thoroughness and accuracy.     Lelon Huh, MD  [facility] [phone] (phone) [phone] (fax)  [place]

## 2022-10-21 ENCOUNTER — Ambulatory Visit (INDEPENDENT_AMBULATORY_CARE_PROVIDER_SITE_OTHER): Payer: BC Managed Care – PPO | Admitting: Family Medicine

## 2022-10-21 ENCOUNTER — Encounter: Payer: Self-pay | Admitting: Family Medicine

## 2022-10-21 VITALS — BP 137/93 | HR 79 | Resp 16 | Ht 72.0 in | Wt 252.0 lb

## 2022-10-21 DIAGNOSIS — K760 Fatty (change of) liver, not elsewhere classified: Secondary | ICD-10-CM | POA: Diagnosis not present

## 2022-10-21 DIAGNOSIS — J301 Allergic rhinitis due to pollen: Secondary | ICD-10-CM

## 2022-10-21 DIAGNOSIS — Z0000 Encounter for general adult medical examination without abnormal findings: Secondary | ICD-10-CM

## 2022-10-21 DIAGNOSIS — F419 Anxiety disorder, unspecified: Secondary | ICD-10-CM

## 2022-10-21 DIAGNOSIS — Z9049 Acquired absence of other specified parts of digestive tract: Secondary | ICD-10-CM

## 2022-10-21 DIAGNOSIS — E790 Hyperuricemia without signs of inflammatory arthritis and tophaceous disease: Secondary | ICD-10-CM

## 2022-10-21 DIAGNOSIS — E782 Mixed hyperlipidemia: Secondary | ICD-10-CM

## 2022-10-21 DIAGNOSIS — K219 Gastro-esophageal reflux disease without esophagitis: Secondary | ICD-10-CM

## 2022-10-21 MED ORDER — AMITRIPTYLINE HCL 25 MG PO TABS: ORAL_TABLET | ORAL | 3 refills | Status: DC

## 2022-10-21 NOTE — Assessment & Plan Note (Signed)
Doing well on current regimen, no changes made today. 

## 2022-10-21 NOTE — Progress Notes (Addendum)
BP (!) 137/93 (BP Location: Right Arm, Patient Position: Sitting, Cuff Size: Large)   Pulse 79   Resp 16   Ht 6' (1.829 m)   Wt 252 lb (114.3 kg)   SpO2 99%   BMI 34.18 kg/m    Subjective:    Patient ID: Russell Pineda, male    DOB: Mar 29, 1966, 56 y.o.   MRN: [id number]  HPI: Russell Pineda is a 56 y.o. male presenting on 10/21/2022 for comprehensive medical examination. Current medical complaints include:none  Anxiety - Medications: amitriptyline, propranolol prn - Taking: good compliance, occasional propranolol use - Counseling: no - Symptoms: none - Current stressors: work  Gout - on allopurinol '100mg'$ . Last flare once years ago. Usual flare in foot.   Depression Screen done today and results listed below:     10/21/2022    9:16 AM 10/15/2021    2:00 PM 05/06/2021   10:26 AM 10/29/2020    9:11 AM 09/13/2018    2:09 PM  Depression screen PHQ 2/9  Decreased Interest 0 0 0 0 0  Down, Depressed, Hopeless 0 0 0 0 0  PHQ - 2 Score 0 0 0 0 0  Altered sleeping '1 1 1 '$ 0   Tired, decreased energy 0  1 1   Change in appetite 0 0 0 0   Feeling bad or failure about yourself  0 0 0 0   Trouble concentrating 1 0 0 0   Moving slowly or fidgety/restless 0 0 0 0   Suicidal thoughts 0 0 0 0   PHQ-9 Score '2 1 2 1   '$ Difficult doing work/chores Not difficult at all Not difficult at all Not difficult at all Not difficult at all      Past Medical History:  Past Medical History:  Diagnosis Date   Acute stress disorder    Allergic rhinitis    Esophageal reflux    Fatty liver    Gout 2016   Hearing loss 2001   Hypercholesterolemia    Hyperlipidemia    Lipoma of abdominal wall    Lumbago    Multiple benign nevi    OSA (obstructive sleep apnea)    CPAP    Surgical History:  Past Surgical History:  Procedure Laterality Date   CHOLECYSTECTOMY N/A 09/12/2019   Procedure: LAPAROSCOPIC CHOLECYSTECTOMY;  Surgeon: Fredirick Maudlin, MD;  Location: [facility];  Service: General;   Laterality: N/A;   COLONOSCOPY WITH PROPOFOL N/A 07/27/2019   Procedure: COLONOSCOPY WITH PROPOFOL;  Surgeon: Virgel Manifold, MD;  Location: [facility];  Service: Endoscopy;  Laterality: N/A;   ESOPHAGOGASTRODUODENOSCOPY (EGD) WITH PROPOFOL N/A 07/27/2019   Procedure: ESOPHAGOGASTRODUODENOSCOPY (EGD) WITH PROPOFOL;  Surgeon: Virgel Manifold, MD;  Location: [facility] END[facility]COPY;  Service: Endoscopy;  Laterality: N/A;   HERNIA REPAIR     orchidectomy     left   TONSILLECTOMY     WISDOM TOOTH EXTRACTION      Medications:  Current Outpatient Medications on File Prior to Visit  Medication Sig   allopurinol (ZYLOPRIM) 100 MG tablet TAKE 1 TABLET BY MOUTH TWICE A DAY   fexofenadine (ALLEGRA) 180 MG tablet Take 180 mg by mouth daily.   ibuprofen (ADVIL) 600 MG tablet Take 1 tablet (600 mg total) by mouth every 8 (eight) hours as needed.   pantoprazole (PROTONIX) 40 MG tablet TAKE 1 TABLET BY MOUTH EVERY DAY   propranolol (INDERAL) 60 MG tablet Take 1 tablet (60 mg total) by mouth every 8 (eight) hours as  needed (for anxiety).   No current facility-administered medications on file prior to visit.    Allergies:  Allergies  Allergen Reactions   Niacin Other (See Comments)    Flushing    Social History:  Social History   Socioeconomic History   Marital status: Married    Spouse name: Not on file   Number of children: Not on file   Years of education: HS Grad   Highest education level: Not on file  Occupational History   Occupation: Full-Time    Comment: [employer]  Tobacco Use   Smoking status: Never   Smokeless tobacco: Former    Types: Chew    Quit date: 11/24/2004  Vaping Use   Vaping Use: Never used  Substance and Sexual Activity   Alcohol use: Yes    Alcohol/week: 2.0 standard drinks of alcohol    Types: 2 Cans of beer per week    Comment: occasional   Drug use: No   Sexual activity: Yes  Other Topics Concern   Not on file  Social History Narrative   Not on  file   Social Determinants of Health   Financial Resource Strain: Low Risk  (10/07/2017)   Overall Financial Resource Strain (CARDIA)    Difficulty of Paying Living Expenses: Not hard at all  Food Insecurity: No Food Insecurity (10/07/2017)   Hunger Vital Sign    Worried About Running Out of Food in the Last Year: Never true    Ran Out of Food in the Last Year: Never true  Transportation Needs: No Transportation Needs (10/07/2017)   PRAPARE - Hydrologist (Medical): No    Lack of Transportation (Non-Medical): No  Physical Activity: Inactive (10/07/2017)   Exercise Vital Sign    Days of Exercise per Week: 0 days    Minutes of Exercise per Session: 0 min  Stress: Stress Concern Present (10/07/2017)   [place]    Feeling of Stress : To some extent  Social Connections: Somewhat Isolated (10/07/2017)   Social Connection and Isolation Panel [NHANES]    Frequency of Communication with Friends and Family: More than three times a week    Frequency of Social Gatherings with Friends and Family: Once a week    Attends Religious Services: Never    Marine scientist or Organizations: No    Attends Archivist Meetings: Never    Marital Status: Married  Human resources officer Violence: Not At Risk (10/07/2017)   Humiliation, Afraid, Rape, and Kick questionnaire    Fear of Current or Ex-Partner: No    Emotionally Abused: No    Physically Abused: No    Sexually Abused: No   Social History   Tobacco Use  Smoking Status Never  Smokeless Tobacco Former   Types: Chew   Quit date: 11/24/2004   Social History   Substance and Sexual Activity  Alcohol Use Yes   Alcohol/week: 2.0 standard drinks of alcohol   Types: 2 Cans of beer per week   Comment: occasional    Family History:  Family History  Problem Relation Age of Onset   Emphysema Mother    Heart disease Father    Heart attack  Father    Cancer Maternal Grandmother        Bone   Cancer Paternal Grandfather    Alcohol abuse Paternal Uncle    Alcohol abuse Maternal Grandfather     Past medical history, surgical  history, medications, allergies, family history and social history reviewed with patient today and changes made to appropriate areas of the chart.      Objective:    BP (!) 137/93 (BP Location: Right Arm, Patient Position: Sitting, Cuff Size: Large)   Pulse 79   Resp 16   Ht 6' (1.829 m)   Wt 252 lb (114.3 kg)   SpO2 99%   BMI 34.18 kg/m   Wt Readings from Last 3 Encounters:  10/21/22 252 lb (114.3 kg)  02/05/22 253 lb 1.6 oz (114.8 kg)  10/15/21 249 lb 1.6 oz (113 kg)    Physical Exam Constitutional:      Appearance: Normal appearance.  HENT:     Right Ear: Tympanic membrane, ear canal and external ear normal.     Left Ear: Tympanic membrane, ear canal and external ear normal.     Nose: Nose normal.     Mouth/Throat:     Mouth: Mucous membranes are moist.     Pharynx: Oropharynx is clear.  Eyes:     Pupils: Pupils are equal, round, and reactive to light.  Cardiovascular:     Rate and Rhythm: Normal rate and regular rhythm.     Heart sounds: Normal heart sounds. No murmur heard. Pulmonary:     Effort: Pulmonary effort is normal.     Breath sounds: Normal breath sounds.  Abdominal:     General: Bowel sounds are normal.     Palpations: Abdomen is soft.  Musculoskeletal:        General: Normal range of motion.     Right lower leg: No edema.     Left lower leg: No edema.  Lymphadenopathy:     Cervical: No cervical adenopathy.  Skin:    General: Skin is warm and dry.  Neurological:     Mental Status: He is alert and oriented to person, place, and time. Mental status is at baseline.     Gait: Gait normal.  Psychiatric:        Mood and Affect: Mood normal.        Behavior: Behavior normal.     Results for orders placed or performed in visit on 10/15/22  POCT Influenza A/B   Result Value Ref Range   Influenza A, POC Negative Negative   Influenza B, POC Negative Negative  POC COVID-19  Result Value Ref Range   SARS Coronavirus 2 Ag Negative Negative      Assessment & Plan:   Problem List Items Addressed This Visit       Respiratory   Allergic rhinitis     Digestive   Esophageal reflux   Relevant Orders   Comprehensive metabolic panel   Hepatic steatosis   Relevant Orders   Comprehensive metabolic panel   Lipid panel     Other   Anxiety    Doing well on current regimen, no changes made today.      Relevant Medications   amitriptyline (ELAVIL) 25 MG tablet   Hyperlipemia, mixed   Relevant Orders   Comprehensive metabolic panel   Lipid panel   Hyperuricemia   Relevant Orders   Comprehensive metabolic panel   Uric acid   Status post cholecystectomy   Other Visit Diagnoses     Annual physical exam    -  Primary   Relevant Orders   HIV antibody (with reflex)   Comprehensive metabolic panel   Lipid panel   PSA   Uric acid  LABORATORY TESTING:  Health maintenance labs ordered today as discussed above.   The natural history of prostate cancer and ongoing controversy regarding screening and potential treatment outcomes of prostate cancer has been discussed with the patient. The meaning of a false positive PSA and a false negative PSA has been discussed. He indicates understanding of the limitations of this screening test and wishes to proceed with screening PSA testing.   IMMUNIZATIONS:   - Tdap: Tetanus vaccination status reviewed: last tetanus booster within 10 years. - Influenza: Refused - Pneumococcal: Not applicable - HPV: Not applicable - Shingrix vaccine:  has received 1 dose in 2021, flu like side effects.  - COVID vaccine: declined  SCREENING: - Colonoscopy: Up to date  Discussed with patient purpose of the colonoscopy is to detect colon cancer at curable precancerous or early stages   - AAA Screening: Not  applicable  - Lung cancer screening: n/a  Hep C Screening: UTD STD testing and prevention (HIV/chl/gon/syphilis): no concerns  PATIENT COUNSELING:    Advanced Care Planning: A voluntary discussion about advance care planning including the explanation and discussion of advance directives.  Discussed health care proxy and Living will, and the patient was able to identify a health care proxy as wife, Arren Laminack.  Patient does not have a living will at present time. If patient does have living will, I have requested they bring this to the clinic to be scanned in to their chart.  Sexuality: Discussed sexually transmitted diseases, partner selection, use of condoms, avoidance of unintended pregnancy  and contraceptive alternatives.   Advised to avoid cigarette smoking.  I discussed with the patient that most people either abstain from alcohol or drink within safe limits (<=14/week and <=4 drinks/occasion for males, <=7/weeks and <= 3 drinks/occasion for females) and that the risk for alcohol disorders and other health effects rises proportionally with the number of drinks per week and how often a drinker exceeds daily limits.  Discussed cessation/primary prevention of drug use and availability of treatment for abuse.   Diet: Encouraged to adjust caloric intake to maintain  or achieve ideal body weight, to reduce intake of dietary saturated fat and total fat, to limit sodium intake by avoiding high sodium foods and not adding table salt, and to maintain adequate dietary potassium and calcium preferably from fresh fruits, vegetables, and low-fat dairy products.    Stressed the importance of regular exercise.  Injury prevention: Discussed safety belts, safety helmets, smoke detector, smoking near bedding or upholstery.   Dental health: Discussed importance of regular tooth brushing, flossing, and dental visits.   Follow up plan: NEXT PREVENTATIVE PHYSICAL DUE IN 1 YEAR. Return in about 1 year  (around 10/22/2023) for cpe.

## 2022-10-21 NOTE — Patient Instructions (Signed)

## 2022-10-22 ENCOUNTER — Encounter: Payer: Self-pay | Admitting: *Deleted

## 2022-10-22 LAB — COMPREHENSIVE METABOLIC PANEL
ALT: 141 IU/L — ABNORMAL HIGH (ref 0–44)
AST: 105 IU/L — ABNORMAL HIGH (ref 0–40)
Albumin/Globulin Ratio: 1.9 (ref 1.2–2.2)
Albumin: 4.5 g/dL (ref 3.8–4.9)
Alkaline Phosphatase: 42 IU/L — ABNORMAL LOW (ref 44–121)
BUN/Creatinine Ratio: 17 (ref 9–20)
BUN: 15 mg/dL (ref 6–24)
Bilirubin Total: 0.6 mg/dL (ref 0.0–1.2)
CO2: 22 mmol/L (ref 20–29)
Calcium: 9.5 mg/dL (ref 8.7–10.2)
Chloride: 104 mmol/L (ref 96–106)
Creatinine, Ser: 0.87 mg/dL (ref 0.76–1.27)
Globulin, Total: 2.4 g/dL (ref 1.5–4.5)
Glucose: 89 mg/dL (ref 70–99)
Potassium: 4.4 mmol/L (ref 3.5–5.2)
Sodium: 141 mmol/L (ref 134–144)
Total Protein: 6.9 g/dL (ref 6.0–8.5)
eGFR: 103 mL/min/{1.73_m2} (ref >59)

## 2022-10-22 LAB — LIPID PANEL
Chol/HDL Ratio: 4.5 ratio (ref 0.0–5.0)
Cholesterol, Total: 202 mg/dL — ABNORMAL HIGH (ref 100–199)
HDL: 45 mg/dL (ref >39)
LDL Chol Calc (NIH): 124 mg/dL — ABNORMAL HIGH (ref 0–99)
Triglycerides: 186 mg/dL — ABNORMAL HIGH (ref 0–149)
VLDL Cholesterol Cal: 33 mg/dL (ref 5–40)

## 2022-10-22 LAB — PSA: Prostate Specific Ag, Serum: 0.6 ng/mL (ref 0.0–4.0)

## 2022-10-22 LAB — HIV ANTIBODY (ROUTINE TESTING W REFLEX): HIV Screen 4th Generation wRfx: NONREACTIVE

## 2022-10-22 LAB — URIC ACID: Uric Acid: 4.5 mg/dL (ref 3.8–8.4)

## 2022-10-22 NOTE — Telephone Encounter (Signed)
[employer] form being sent back for completion.  Please complete and fax to # on form.

## 2022-10-24 ENCOUNTER — Other Ambulatory Visit: Payer: Self-pay | Admitting: Family Medicine

## 2022-11-10 ENCOUNTER — Encounter: Payer: BC Managed Care – PPO | Admitting: Family Medicine

## 2022-11-28 ENCOUNTER — Encounter: Payer: Self-pay | Admitting: Physician Assistant

## 2022-11-28 ENCOUNTER — Ambulatory Visit (INDEPENDENT_AMBULATORY_CARE_PROVIDER_SITE_OTHER): Payer: BC Managed Care – PPO | Admitting: Physician Assistant

## 2022-11-28 VITALS — BP 110/78 | HR 86 | Temp 97.8°F | Resp 16 | Ht 71.0 in | Wt 244.6 lb

## 2022-11-28 DIAGNOSIS — J069 Acute upper respiratory infection, unspecified: Secondary | ICD-10-CM | POA: Diagnosis not present

## 2022-11-28 DIAGNOSIS — R051 Acute cough: Secondary | ICD-10-CM

## 2022-11-28 LAB — POCT INFLUENZA A/B
Influenza A, POC: NEGATIVE
Influenza B, POC: NEGATIVE

## 2022-11-28 LAB — POC COVID19 BINAXNOW: SARS Coronavirus 2 Ag: NEGATIVE

## 2022-11-28 NOTE — Progress Notes (Signed)
I,Joseline E Rosas,acting as a scribe for [employer], PA-C.,have documented all relevant documentation on the behalf of Mikey Kirschner, PA-C,as directed by  Mikey Kirschner, PA-C while in the presence of Mikey Kirschner, PA-C.   Established patient visit   Patient: Russell Pineda   DOB: 03/14/1966   57 y.o. Male  MRN: [id number] Visit Date: 11/28/2022  Today's healthcare provider: Mikey Kirschner, PA-C   Chief Complaint  Patient presents with   URI   Subjective    HPI  Upper respiratory symptoms He complains of congestion and cough described as productive,body aches, chills,sinus pressure x 3 days. He is drinking plenty of fluids.  Past history is significant for no history of pneumonia or bronchitis. Patient is non-smoker. Treatments tried: Mucinex-D. Denies fever, shortness of breath, wheezing.  ---------------------------------------------------------------------------------------------------   Medications: Outpatient Medications Prior to Visit  Medication Sig   allopurinol (ZYLOPRIM) 100 MG tablet TAKE 1 TABLET BY MOUTH TWICE A DAY   amitriptyline (ELAVIL) 25 MG tablet TAKE 1 TABLET BY MOUTH EVERY DAY AT BEDTIME AS NEEDED   fexofenadine (ALLEGRA) 180 MG tablet Take 180 mg by mouth daily.   ibuprofen (ADVIL) 600 MG tablet Take 1 tablet (600 mg total) by mouth every 8 (eight) hours as needed.   pantoprazole (PROTONIX) 40 MG tablet TAKE 1 TABLET BY MOUTH EVERY DAY   propranolol (INDERAL) 60 MG tablet Take 1 tablet (60 mg total) by mouth every 8 (eight) hours as needed (for anxiety).   No facility-administered medications prior to visit.    Review of Systems  Constitutional:  Positive for chills and fatigue. Negative for fever.  HENT:  Positive for congestion, postnasal drip, rhinorrhea and sinus pressure. Negative for sore throat.   Respiratory:  Positive for cough. Negative for chest tightness, shortness of breath and wheezing.      Objective    BP 110/78 (BP  Location: Left Arm, Patient Position: Sitting, Cuff Size: Large)   Pulse 86   Temp 97.8 F (36.6 C) (Oral)   Resp 16   Ht '5\' 11"'$  (1.803 m)   Wt 244 lb 9.6 oz (110.9 kg)   BMI 34.11 kg/m  BP Readings from Last 3 Encounters:  11/28/22 110/78  10/21/22 (!) 137/93  10/15/22 129/84   Wt Readings from Last 3 Encounters:  11/28/22 244 lb 9.6 oz (110.9 kg)  10/21/22 252 lb (114.3 kg)  02/05/22 253 lb 1.6 oz (114.8 kg)    Physical Exam Constitutional:      General: He is awake.     Appearance: He is well-developed.  HENT:     Head: Normocephalic.     Right Ear: Tympanic membrane normal.     Left Ear: Tympanic membrane normal.     Mouth/Throat:     Pharynx: Posterior oropharyngeal erythema present. No oropharyngeal exudate.  Eyes:     Conjunctiva/sclera: Conjunctivae normal.  Cardiovascular:     Rate and Rhythm: Normal rate and regular rhythm.     Heart sounds: Normal heart sounds.  Pulmonary:     Effort: Pulmonary effort is normal.     Breath sounds: No wheezing, rhonchi or rales.  Skin:    General: Skin is warm.  Neurological:     Mental Status: He is alert and oriented to person, place, and time.  Psychiatric:        Attention and Perception: Attention normal.        Mood and Affect: Mood normal.        Speech: Speech  normal.        Behavior: Behavior is cooperative.     Results for orders placed or performed in visit on 11/28/22  POC COVID-19  Result Value Ref Range   SARS Coronavirus 2 Ag Negative Negative  POCT Influenza A/B  Result Value Ref Range   Influenza A, POC Negative Negative   Influenza B, POC Negative Negative    Assessment & Plan     URI Poc covid negative poc flu a/b negative Advised ok to continue otc meds, tylenol, increase fluids, rest Any worsening symptoms please contact office  Return if symptoms worsen or fail to improve.      I, Mikey Kirschner, PA-C have reviewed all documentation for this visit. The documentation on 11/28/2022  for  the exam, diagnosis, procedures, and orders are all accurate and complete.  Mikey Kirschner, PA-C [facility] [address] [place], [place], [address] Office: [phone] Fax: [place]

## 2023-01-12 IMAGING — CR DG CERVICAL SPINE COMPLETE 4+V
1 series · 8 of 8 positions shown · non-contrast
Comparison: None.

CLINICAL DATA: Right-sided neck pain that radiates into right
shoulder. Neck/shoulder trauma.

EXAM:
CERVICAL SPINE - COMPLETE 4+ VIEW

[Series 1: dg cervical spine complete · 0.14mm/px · 8 of 8 slices shown]
[im 1/8]
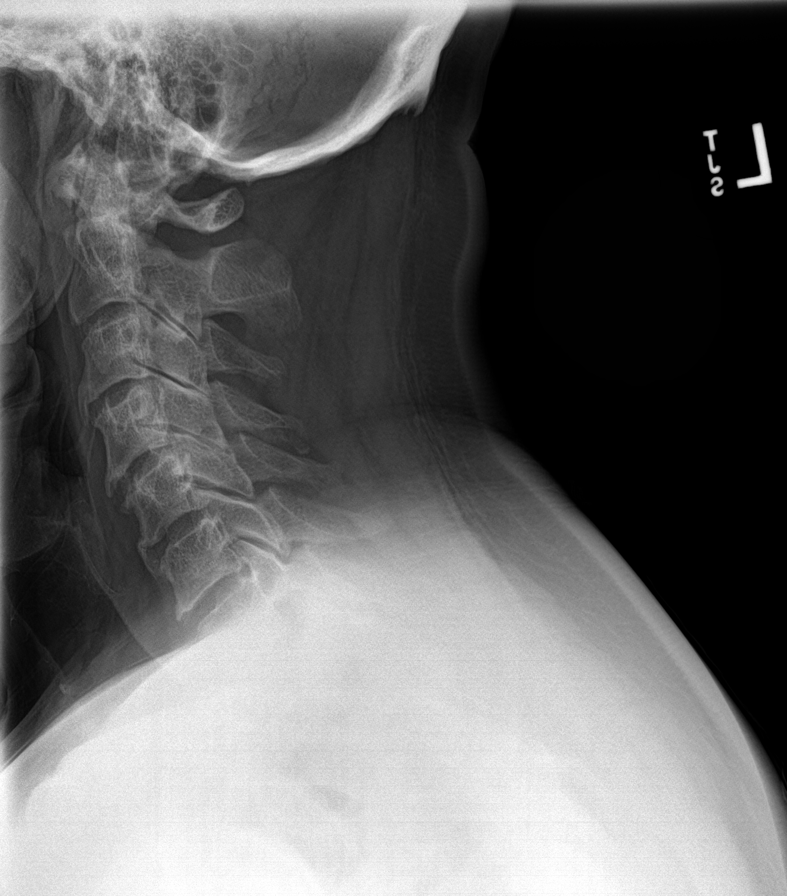
[im 2/8]
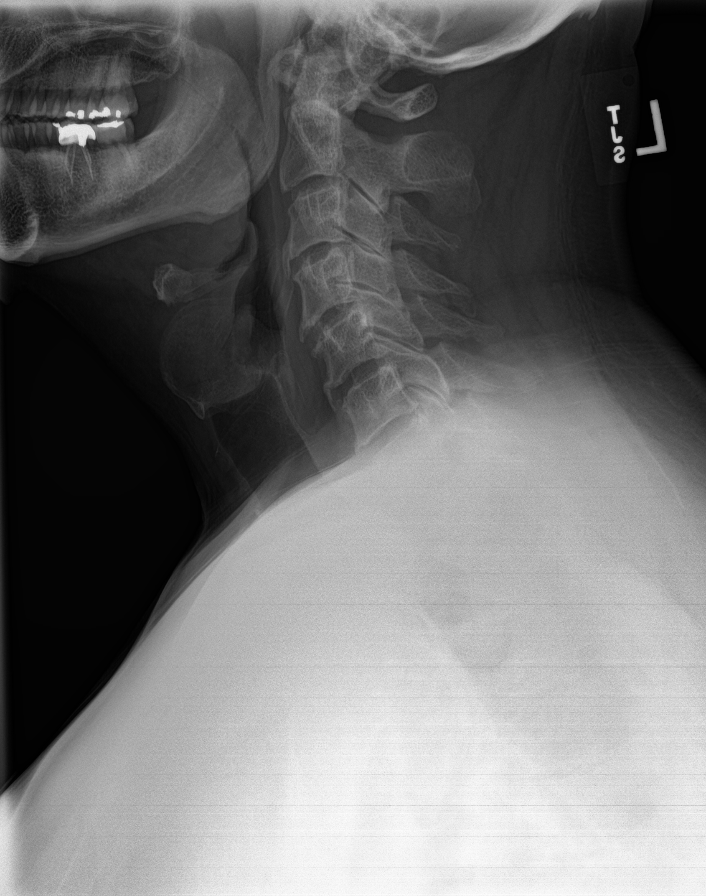
[im 3/8]
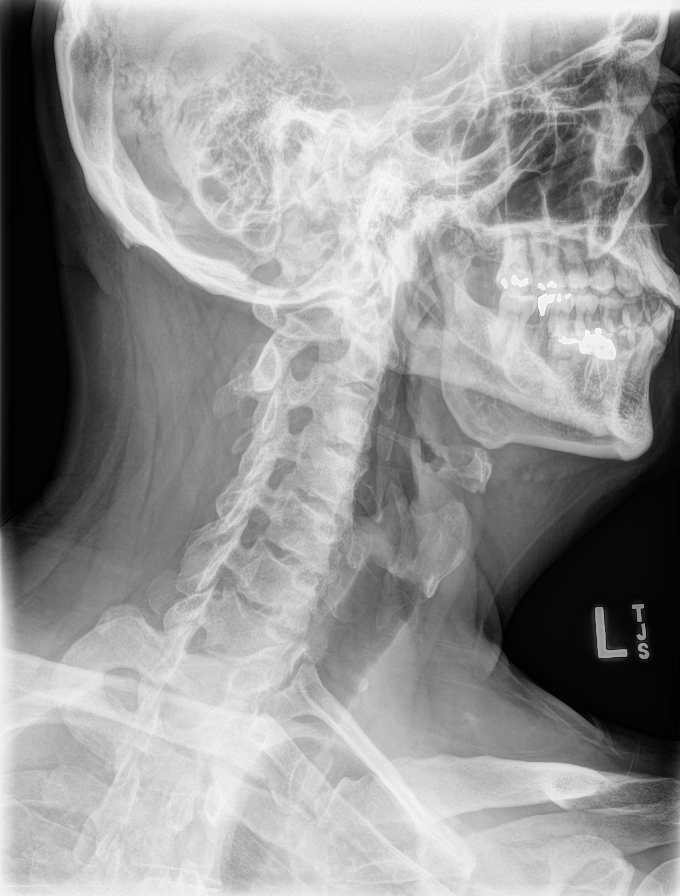
[im 4/8]
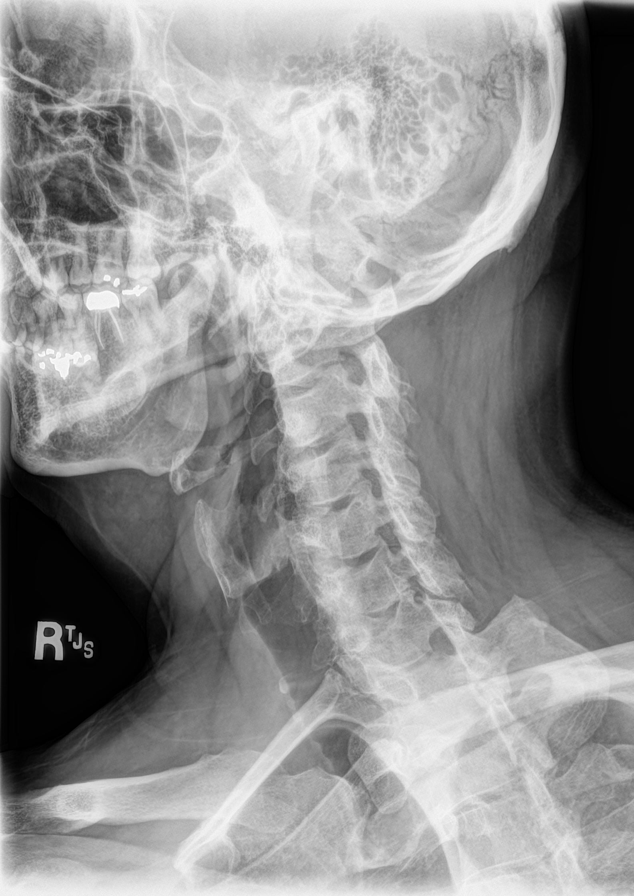
[im 5/8]
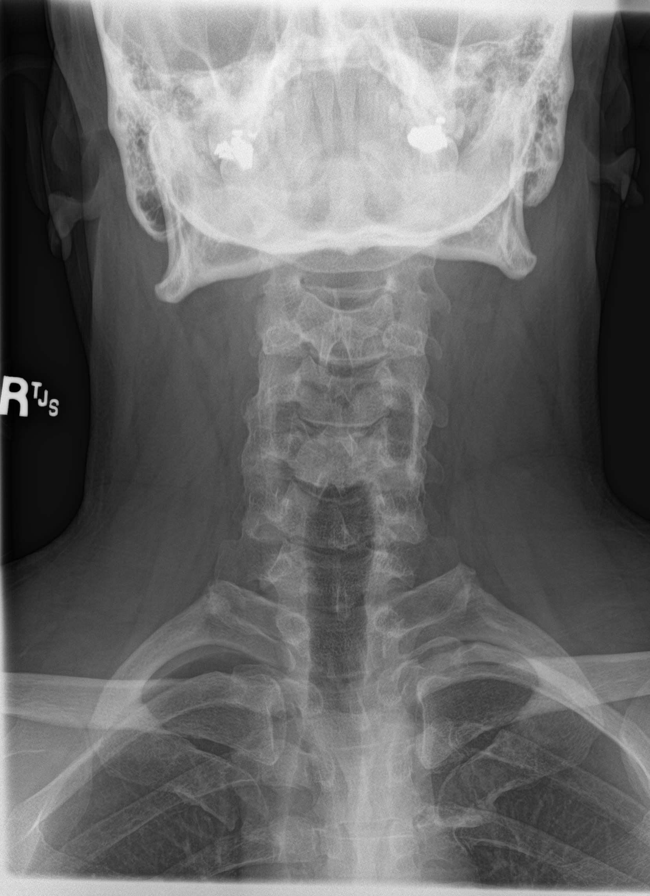
[im 6/8]
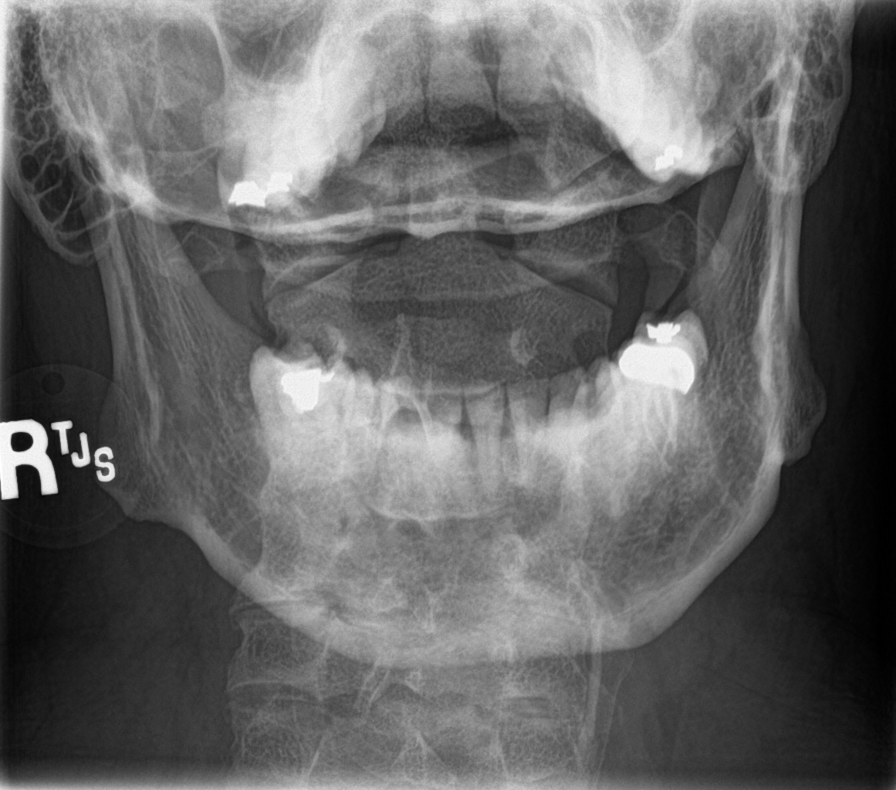
[im 7/8]
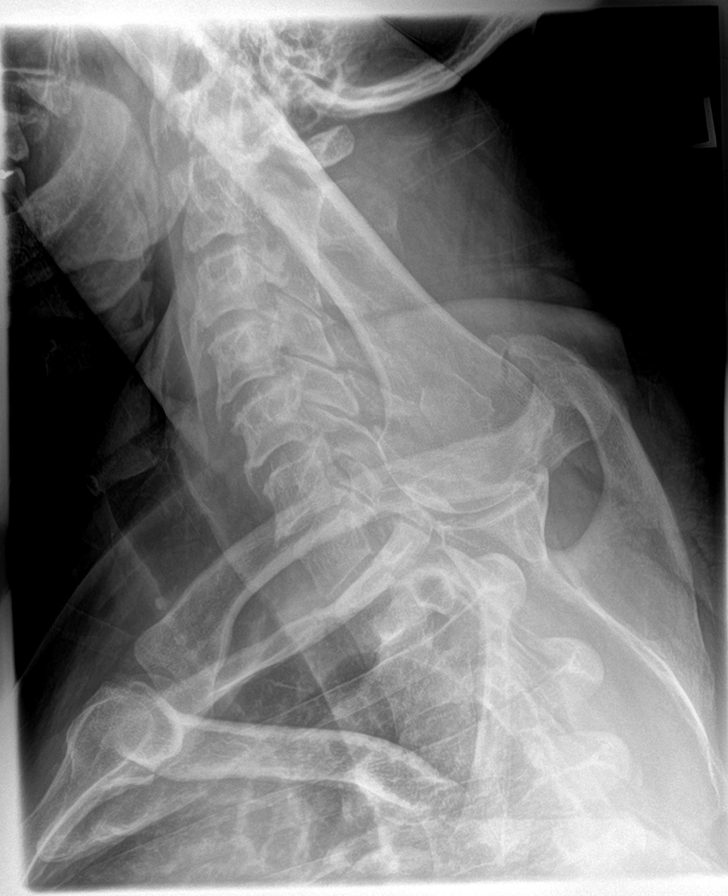
[im 8/8]
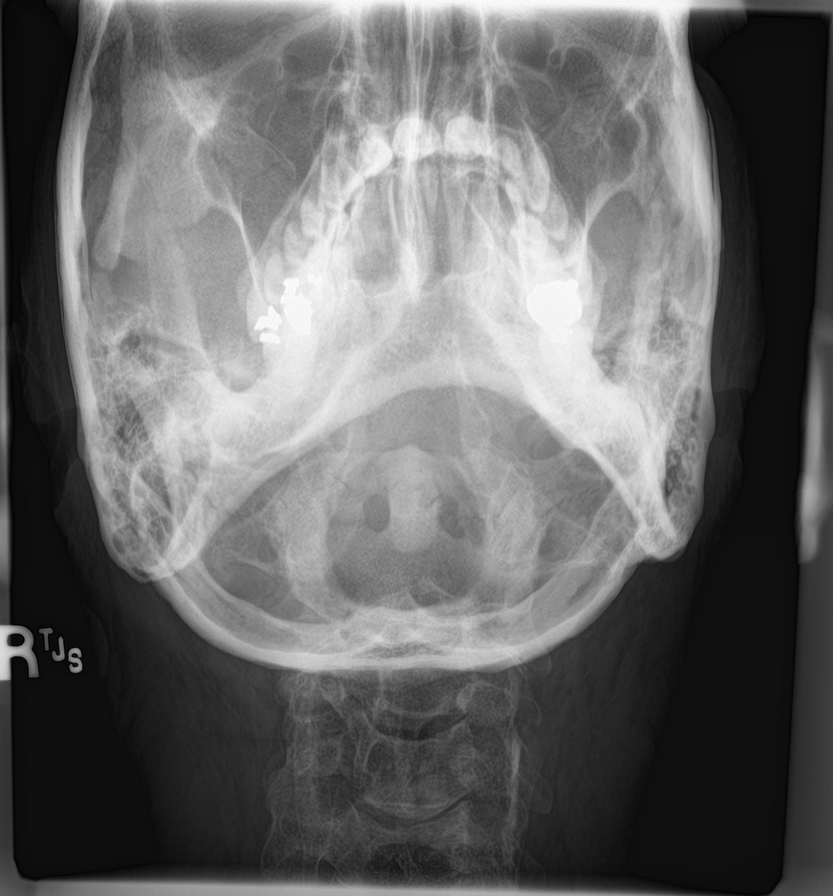

[8 of 8 positions shown; findings below may reference images not displayed]

FINDINGS: No malalignment or fracture. Pre odontoid space and prevertebral
soft tissues are normal. Multilevel degenerative disc disease with
anterior osteophytes. Neural foramina are patent. Lateral masses of
C1 align with C2. No other abnormalities.
IMPRESSION: Multilevel degenerative disc disease with anterior osteophytes. No
other abnormalities identified.

## 2023-03-09 DIAGNOSIS — H40013 Open angle with borderline findings, low risk, bilateral: Secondary | ICD-10-CM | POA: Diagnosis not present

## 2023-03-09 DIAGNOSIS — H1045 Other chronic allergic conjunctivitis: Secondary | ICD-10-CM | POA: Diagnosis not present

## 2023-03-09 DIAGNOSIS — H04123 Dry eye syndrome of bilateral lacrimal glands: Secondary | ICD-10-CM | POA: Diagnosis not present

## 2023-05-25 ENCOUNTER — Telehealth: Payer: Self-pay | Admitting: Family Medicine

## 2023-05-25 DIAGNOSIS — M109 Gout, unspecified: Secondary | ICD-10-CM

## 2023-05-25 NOTE — Telephone Encounter (Signed)
[facility] is requesting new RX  INDOMETHACIN Please advise

## 2023-05-26 MED ORDER — INDOMETHACIN 50 MG PO CAPS: ORAL_CAPSULE | ORAL | 0 refills | Status: DC

## 2023-06-11 ENCOUNTER — Other Ambulatory Visit: Payer: Self-pay | Admitting: Family Medicine

## 2023-06-11 DIAGNOSIS — M109 Gout, unspecified: Secondary | ICD-10-CM

## 2023-06-12 NOTE — Telephone Encounter (Signed)
Requested medication (s) are due for refill today: routing for review  Requested medication (s) are on the active medication list: yes  Last refill:  05/26/23  Future visit scheduled: no  Notes to clinic:  Unable to refill per protocol, medication was given for short supply, should patient continue to take, routing for approval.      Requested Prescriptions  Pending Prescriptions Disp Refills   indomethacin (INDOCIN) 50 MG capsule [Pharmacy Med Name: INDOMETHACIN 50 MG CAPSULE] 30 capsule 0    Sig: TAKE ONE CAPSULE BY MOUTH TWICE A DAY WITH MEAL AS NEEDED FOR GOUT     Analgesics:  NSAIDS Failed - 06/11/2023  5:32 PM      Failed - Manual Review: Labs are only required if the patient has taken medication for more than 8 weeks.      Failed - HGB in normal range and within 360 days    Hemoglobin  Date Value Ref Range Status  10/21/2021 17.3 13.0 - 17.7 g/dL Final         Failed - PLT in normal range and within 360 days    Platelets  Date Value Ref Range Status  10/21/2021 211 150 - 450 x10E3/uL Final         Failed - HCT in normal range and within 360 days    Hematocrit  Date Value Ref Range Status  10/21/2021 49.4 37.5 - 51.0 % Final         Passed - Cr in normal range and within 360 days    Creat  Date Value Ref Range Status  09/30/2017 0.83 0.60 - 1.35 mg/dL Final   Creatinine, Ser  Date Value Ref Range Status  10/21/2022 0.87 0.76 - 1.27 mg/dL Final         Passed - eGFR is 30 or above and within 360 days    GFR, Est African American  Date Value Ref Range Status  09/30/2017 120 > OR = 60 mL/min/1.63m2 Final   GFR calc Af Amer  Date Value Ref Range Status  10/29/2020 117 >59 mL/min/1.73 Final    Comment:    **In accordance with recommendations from the NKF-ASN Task force,**   [facility] is in the process of updating its eGFR calculation to the   2021 CKD-EPI creatinine equation that estimates kidney function   without a race variable.    GFR, Est Non African  American  Date Value Ref Range Status  09/30/2017 103 > OR = 60 mL/min/1.40m2 Final   GFR, Estimated  Date Value Ref Range Status  04/27/2021 >60 >60 mL/min Final    Comment:    (NOTE) Calculated using the CKD-EPI Creatinine Equation (2021)    eGFR  Date Value Ref Range Status  10/21/2022 103 >59 mL/min/1.73 Final         Passed - Patient is not pregnant      Passed - Valid encounter within last 12 months    Recent Outpatient Visits           6 months ago Upper respiratory tract infection, unspecified type   [facility] Alfredia Ferguson, PA-C   7 months ago Annual physical exam   [facility] [place] Dense M, DO   8 months ago Upper respiratory tract infection, unspecified type   [facility] Malva Limes, MD   1 year ago Neck pain   [facility] [place], [place], [place]   1 year ago  Annual physical exam   [facility] Malva Limes, MD

## 2023-07-07 ENCOUNTER — Other Ambulatory Visit: Payer: Self-pay | Admitting: Family Medicine

## 2023-07-07 DIAGNOSIS — F439 Reaction to severe stress, unspecified: Secondary | ICD-10-CM

## 2023-07-29 ENCOUNTER — Other Ambulatory Visit: Payer: Self-pay | Admitting: Family Medicine

## 2023-07-29 DIAGNOSIS — F419 Anxiety disorder, unspecified: Secondary | ICD-10-CM

## 2023-07-30 NOTE — Telephone Encounter (Signed)
Requested Prescriptions  Refused Prescriptions Disp Refills   amitriptyline (ELAVIL) 25 MG tablet [Pharmacy Med Name: AMITRIPTYLINE HCL 25 MG TAB] 90 tablet 3    Sig: TAKE 1 TABLET BY MOUTH EVERY DAY AT BEDTIME AS NEEDED     Psychiatry:  Antidepressants - Heterocyclics (TCAs) Failed - 07/29/2023  2:16 PM      Failed - Valid encounter within last 6 months    Recent Outpatient Visits           8 months ago Upper respiratory tract infection, unspecified type   [facility] Alfredia Ferguson, PA-C   9 months ago Annual physical exam   [facility] [place] Dense M, DO   9 months ago Upper respiratory tract infection, unspecified type   [facility] Malva Limes, MD   1 year ago Neck pain   [facility] [place], [place], PA-C   1 year ago Annual physical exam   [facility] Malva Limes, MD       Future Appointments             In 1 week Fisher, Demetrios Isaacs, MD [facility]

## 2023-08-07 ENCOUNTER — Ambulatory Visit: Payer: BC Managed Care – PPO | Attending: Family Medicine

## 2023-08-07 ENCOUNTER — Ambulatory Visit (INDEPENDENT_AMBULATORY_CARE_PROVIDER_SITE_OTHER): Payer: BC Managed Care – PPO | Admitting: Family Medicine

## 2023-08-07 VITALS — BP 139/84 | HR 67 | Temp 98.6°F | Ht 71.0 in | Wt 251.2 lb

## 2023-08-07 DIAGNOSIS — R42 Dizziness and giddiness: Secondary | ICD-10-CM

## 2023-08-07 DIAGNOSIS — E782 Mixed hyperlipidemia: Secondary | ICD-10-CM

## 2023-08-07 DIAGNOSIS — F419 Anxiety disorder, unspecified: Secondary | ICD-10-CM | POA: Diagnosis not present

## 2023-08-07 MED ORDER — ALPRAZOLAM 0.5 MG PO TABS
0.2500 mg | ORAL_TABLET | Freq: Two times a day (BID) | ORAL | 0 refills | Status: DC | PRN
Start: 2023-08-07 — End: 2024-09-07

## 2023-08-07 NOTE — Progress Notes (Signed)
Established patient visit   Patient: Russell Pineda   DOB: 01/18/1966   57 y.o. Male  MRN: [id number] Visit Date: 08/07/2023  Today's healthcare provider: Mila Merry, MD    Subjective    HPI  Reports several episodes of light headedness and generalized weakness over the last several weeks. Often when standing up, but sometimes while sitting down, including at least one episode while driving and had to pull over to side of road. No spinning sensation, no chest pains, palpitations, nausea, or dyspnea. Has a history of anxiety and panic type attacks for which he takes propranolol, although he had not taken propranolol prior to first few episodes. He has been taking propranolol occasionally since then, as he thought the episodes might be anxiety related, However, it doesn't seem to help symptoms consistently.   He is also due for health checkup for employer and due for to check lipids.  Medications: Outpatient Medications Prior to Visit  Medication Sig   allopurinol (ZYLOPRIM) 100 MG tablet TAKE 1 TABLET BY MOUTH TWICE A DAY   amitriptyline (ELAVIL) 25 MG tablet TAKE 1 TABLET BY MOUTH EVERY DAY AT BEDTIME AS NEEDED   fexofenadine (ALLEGRA) 180 MG tablet Take 180 mg by mouth daily.   ibuprofen (ADVIL) 600 MG tablet Take 1 tablet (600 mg total) by mouth every 8 (eight) hours as needed.   indomethacin (INDOCIN) 50 MG capsule TAKE ONE CAPSULE BY MOUTH TWICE A DAY WITH MEAL AS NEEDED FOR GOUT   pantoprazole (PROTONIX) 40 MG tablet TAKE 1 TABLET BY MOUTH EVERY DAY   propranolol (INDERAL) 60 MG tablet TAKE 1 TABLET (60 MG TOTAL) BY MOUTH EVERY 8 (EIGHT) HOURS AS NEEDED (FOR ANXIETY).   No facility-administered medications prior to visit.    Review of Systems  Constitutional:  Negative for appetite change, chills and fever.  Respiratory:  Negative for chest tightness, shortness of breath and wheezing.   Cardiovascular:  Negative for chest pain and palpitations.  Gastrointestinal:   Negative for abdominal pain, nausea and vomiting.       Objective    BP 139/84 (BP Location: Right Arm, Patient Position: Sitting, Cuff Size: Normal)   Pulse 67   Temp 98.6 F (37 C) (Oral)   Ht 5\' 11"  (1.803 m)   Wt 251 lb 3.2 oz (113.9 kg)   SpO2 100%   BMI 35.04 kg/m    Physical Exam   General: Appearance:    Mildly obese male in no acute distress  Eyes:    PERRL, conjunctiva/corneas clear, EOM's intact       Lungs:     Clear to auscultation bilaterally, respirations unlabored  Heart:    Normal heart rate. Normal rhythm. No murmurs, rubs, or gallops.    MS:   All extremities are intact.    Neurologic:   Awake, alert, oriented x 3. No apparent focal neurological defect.        Assessment & Plan     1. Dizziness Not c/with vertigo - LONG TERM MONITOR (3-14 DAYS); Future - CBC - Magnesium - T4, free - TSH - Comprehensive metabolic panel  2. Hyperlipemia, mixed  - Lipid panel  3. Anxiety Minimal relief from propanol which he has been taking prn for years - ALPRAZolam (XANAX) 0.5 MG tablet; Take 0.5-1 tablets (0.25-0.5 mg total) by mouth 2 (two) times daily as needed for anxiety.  Dispense: 20 tablet; Refill: 0         Mila Merry, MD  [facility] [phone] (phone) [phone] (fax)  [facility]

## 2023-08-08 ENCOUNTER — Other Ambulatory Visit: Payer: Self-pay | Admitting: Family Medicine

## 2023-08-08 DIAGNOSIS — K219 Gastro-esophageal reflux disease without esophagitis: Secondary | ICD-10-CM

## 2023-08-10 DIAGNOSIS — R42 Dizziness and giddiness: Secondary | ICD-10-CM | POA: Diagnosis not present

## 2023-08-10 DIAGNOSIS — E782 Mixed hyperlipidemia: Secondary | ICD-10-CM | POA: Diagnosis not present

## 2023-08-10 NOTE — Telephone Encounter (Signed)
Requested Prescriptions  Pending Prescriptions Disp Refills   pantoprazole (PROTONIX) 40 MG tablet [Pharmacy Med Name: PANTOPRAZOLE SOD DR 40 MG TAB] 90 tablet 0    Sig: TAKE 1 TABLET BY MOUTH EVERY DAY     Gastroenterology: Proton Pump Inhibitors Passed - 08/08/2023  8:51 AM      Passed - Valid encounter within last 12 months    Recent Outpatient Visits           3 days ago Dizziness   [facility] Malva Limes, MD   8 months ago Upper respiratory tract infection, unspecified type   [facility] Alfredia Ferguson, PA-C   9 months ago Annual physical exam   [facility] [place] Dense M, DO   9 months ago Upper respiratory tract infection, unspecified type   [facility] Malva Limes, MD   1 year ago Neck pain   [facility] [place], [place], [place]

## 2023-08-11 DIAGNOSIS — R42 Dizziness and giddiness: Secondary | ICD-10-CM

## 2023-08-11 LAB — CBC
Hematocrit: 48.3 % (ref 37.5–51.0)
Hemoglobin: 16.4 g/dL (ref 13.0–17.7)
MCH: 32 pg (ref 26.6–33.0)
MCHC: 34 g/dL (ref 31.5–35.7)
MCV: 94 fL (ref 79–97)
Platelets: 202 10*3/uL (ref 150–450)
RBC: 5.13 x10E6/uL (ref 4.14–5.80)
RDW: 11.9 % (ref 11.6–15.4)
WBC: 5.4 10*3/uL (ref 3.4–10.8)

## 2023-08-11 LAB — LIPID PANEL
Chol/HDL Ratio: 3.8 ratio (ref 0.0–5.0)
Cholesterol, Total: 196 mg/dL (ref 100–199)
HDL: 52 mg/dL (ref >39)
LDL Chol Calc (NIH): 114 mg/dL — ABNORMAL HIGH (ref 0–99)
Triglycerides: 173 mg/dL — ABNORMAL HIGH (ref 0–149)
VLDL Cholesterol Cal: 30 mg/dL (ref 5–40)

## 2023-08-11 LAB — MAGNESIUM: Magnesium: 2 mg/dL (ref 1.6–2.3)

## 2023-08-11 LAB — COMPREHENSIVE METABOLIC PANEL
ALT: 32 IU/L (ref 0–44)
AST: 31 IU/L (ref 0–40)
Albumin: 4.1 g/dL (ref 3.8–4.9)
Alkaline Phosphatase: 38 IU/L — ABNORMAL LOW (ref 44–121)
BUN/Creatinine Ratio: 19 (ref 9–20)
BUN: 16 mg/dL (ref 6–24)
Bilirubin Total: 0.5 mg/dL (ref 0.0–1.2)
CO2: 21 mmol/L (ref 20–29)
Calcium: 9.2 mg/dL (ref 8.7–10.2)
Chloride: 105 mmol/L (ref 96–106)
Creatinine, Ser: 0.86 mg/dL (ref 0.76–1.27)
Globulin, Total: 2 g/dL (ref 1.5–4.5)
Glucose: 104 mg/dL — ABNORMAL HIGH (ref 70–99)
Potassium: 4.3 mmol/L (ref 3.5–5.2)
Sodium: 141 mmol/L (ref 134–144)
Total Protein: 6.1 g/dL (ref 6.0–8.5)
eGFR: 102 mL/min/{1.73_m2} (ref >59)

## 2023-08-11 LAB — T4, FREE: Free T4: 1.02 ng/dL (ref 0.82–1.77)

## 2023-08-11 LAB — TSH: TSH: 3.2 u[IU]/mL (ref 0.450–4.500)

## 2023-12-07 ENCOUNTER — Other Ambulatory Visit: Payer: Self-pay | Admitting: Family Medicine

## 2023-12-07 DIAGNOSIS — K219 Gastro-esophageal reflux disease without esophagitis: Secondary | ICD-10-CM

## 2024-01-08 ENCOUNTER — Other Ambulatory Visit: Payer: Self-pay | Admitting: Family Medicine

## 2024-01-08 DIAGNOSIS — F419 Anxiety disorder, unspecified: Secondary | ICD-10-CM

## 2024-01-26 ENCOUNTER — Other Ambulatory Visit: Payer: Self-pay | Admitting: Family Medicine

## 2024-01-26 NOTE — Telephone Encounter (Signed)
 Requested medications are due for refill today.  yes  Requested medications are on the active medications list.  yes  Last refill. 10/24/2022 #180 4 rf  Future visit scheduled.   no  Notes to clinic.  Labs are expired.    Requested Prescriptions  Pending Prescriptions Disp Refills   allopurinol (ZYLOPRIM) 100 MG tablet [Pharmacy Med Name: ALLOPURINOL 100 MG TABLET] 180 tablet 4    Sig: TAKE 1 TABLET BY MOUTH TWICE A DAY     Endocrinology:  Gout Agents - allopurinol Failed - 01/26/2024  5:45 PM      Failed - Uric Acid in normal range and within 360 days    Uric Acid  Date Value Ref Range Status  10/21/2022 4.5 3.8 - 8.4 mg/dL Final    Comment:               Therapeutic target for gout patients: <6.0         Failed - CBC within normal limits and completed in the last 12 months    WBC  Date Value Ref Range Status  08/10/2023 5.4 3.4 - 10.8 x10E3/uL Final  04/27/2021 7.6 4.0 - 10.5 K/uL Final   RBC  Date Value Ref Range Status  08/10/2023 5.13 4.14 - 5.80 x10E6/uL Final  04/27/2021 4.95 4.22 - 5.81 MIL/uL Final   Hemoglobin  Date Value Ref Range Status  08/10/2023 16.4 13.0 - 17.7 g/dL Final   Hematocrit  Date Value Ref Range Status  08/10/2023 48.3 37.5 - 51.0 % Final   MCHC  Date Value Ref Range Status  08/10/2023 34.0 31.5 - 35.7 g/dL Final  16/08/9603 54.0 30.0 - 36.0 g/dL Final   [facility]  Date Value Ref Range Status  08/10/2023 32.0 26.6 - 33.0 pg Final  04/27/2021 32.1 26.0 - 34.0 pg Final   MCV  Date Value Ref Range Status  08/10/2023 94 79 - 97 fL Final   No results found for: "PLTCOUNTKUC", "LABPLAT", "POCPLA" RDW  Date Value Ref Range Status  08/10/2023 11.9 11.6 - 15.4 % Final         Passed - Cr in normal range and within 360 days    Creat  Date Value Ref Range Status  09/30/2017 0.83 0.60 - 1.35 mg/dL Final   Creatinine, Ser  Date Value Ref Range Status  08/10/2023 0.86 0.76 - 1.27 mg/dL Final         Passed - Valid encounter within last  12 months    Recent Outpatient Visits           5 months ago Dizziness   [facility] Malva Limes, MD   1 year ago Upper respiratory tract infection, unspecified type   [facility] Alfredia Ferguson, PA-C   1 year ago Annual physical exam   [facility] Caro Laroche, DO   1 year ago Upper respiratory tract infection, unspecified type   [facility] Malva Limes, MD   1 year ago Neck pain    [facility] [place], [place], [place]

## 2024-02-23 ENCOUNTER — Other Ambulatory Visit: Payer: Self-pay | Admitting: Family Medicine

## 2024-03-04 ENCOUNTER — Other Ambulatory Visit: Payer: Self-pay | Admitting: Family Medicine

## 2024-03-04 DIAGNOSIS — K219 Gastro-esophageal reflux disease without esophagitis: Secondary | ICD-10-CM

## 2024-03-09 DIAGNOSIS — H40013 Open angle with borderline findings, low risk, bilateral: Secondary | ICD-10-CM | POA: Diagnosis not present

## 2024-03-09 DIAGNOSIS — H04123 Dry eye syndrome of bilateral lacrimal glands: Secondary | ICD-10-CM | POA: Diagnosis not present

## 2024-03-09 DIAGNOSIS — H1045 Other chronic allergic conjunctivitis: Secondary | ICD-10-CM | POA: Diagnosis not present

## 2024-08-09 ENCOUNTER — Telehealth: Payer: Self-pay

## 2024-08-09 ENCOUNTER — Other Ambulatory Visit: Payer: Self-pay

## 2024-08-09 DIAGNOSIS — Z860100 Personal history of colon polyps, unspecified: Secondary | ICD-10-CM

## 2024-08-09 MED ORDER — NA SULFATE-K SULFATE-MG SULF 17.5-3.13-1.6 GM/177ML PO SOLN: 1.0000 | Freq: Once | ORAL | 0 refills | Status: AC

## 2024-08-09 NOTE — Telephone Encounter (Signed)
 Patient calls office at this time to schedule colonoscopy 12-10-23 [place] with Dr.Scholls. Instructions mailed- Suprep sent to pharmacy.

## 2024-08-12 ENCOUNTER — Other Ambulatory Visit: Payer: Self-pay | Admitting: Family Medicine

## 2024-08-29 ENCOUNTER — Other Ambulatory Visit: Payer: Self-pay | Admitting: Family Medicine

## 2024-08-29 DIAGNOSIS — K219 Gastro-esophageal reflux disease without esophagitis: Secondary | ICD-10-CM

## 2024-09-07 ENCOUNTER — Encounter: Payer: Self-pay | Admitting: Family Medicine

## 2024-09-07 ENCOUNTER — Ambulatory Visit (INDEPENDENT_AMBULATORY_CARE_PROVIDER_SITE_OTHER): Admitting: Family Medicine

## 2024-09-07 VITALS — BP 132/81 | HR 64 | Resp 16 | Ht 71.0 in | Wt 256.2 lb

## 2024-09-07 DIAGNOSIS — E790 Hyperuricemia without signs of inflammatory arthritis and tophaceous disease: Secondary | ICD-10-CM

## 2024-09-07 DIAGNOSIS — Z0001 Encounter for general adult medical examination with abnormal findings: Secondary | ICD-10-CM | POA: Diagnosis not present

## 2024-09-07 DIAGNOSIS — Z23 Encounter for immunization: Secondary | ICD-10-CM | POA: Diagnosis not present

## 2024-09-07 DIAGNOSIS — E782 Mixed hyperlipidemia: Secondary | ICD-10-CM | POA: Diagnosis not present

## 2024-09-07 DIAGNOSIS — Z0000 Encounter for general adult medical examination without abnormal findings: Secondary | ICD-10-CM

## 2024-09-07 DIAGNOSIS — Z125 Encounter for screening for malignant neoplasm of prostate: Secondary | ICD-10-CM | POA: Diagnosis not present

## 2024-09-07 NOTE — Progress Notes (Signed)
 Complete physical exam   Patient: Russell Pineda   DOB: Jan 02, 1966   58 y.o. Male  MRN: [id number] Visit Date: 09/07/2024  Today's healthcare provider: Nancyann Perry, MD   Chief Complaint  Patient presents with   Annual Exam    Patient doing well. He is exercising some. He is sleeping fairly well. Patient declined vaccines today.   Subjective    Discussed the use of AI scribe software for clinical note transcription with the patient, who gave verbal consent to proceed.  History of Present Illness   Russell Pineda is a 58 year old male with GERD and history of gout who presents for an annual physical exam.  He experienced a recent episode of gout about a month ago, which he attributes to excessive consumption of homemade dessert. He has not used indomethacin  for a while and only took it during this episode. He is currently on allopurinol  100 mg twice daily, once in the morning and once at night. He has not had other gout-related issues recently and manages his condition by reducing red meat intake to once a week and eliminating sugars, fructose, and corn syrup from his diet.  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 He has a prescription for alprazolam  but has only taken it once and prefers not to use it while driving. He also takes amitriptyline .  He had a colonoscopy about five years ago with some polyps and has scheduled a follow-up colonoscopy with Dr. Jinny in January. He recalls receiving a reminder in April and arranged the appointment accordingly.  He mentions a past prescription for a steroid cream used for bug bites, which he hasn't used in over a year. [place] get bitten and break out. He is unsure of the exact name of the cream but recalls it being effective.  No chest pains, heart flutters, shortness  of breath, or hearing issues. He reports feeling a little dizzy when standing with eyes closed but attributes it to getting up quickly. He does not engage in regular exercise due to work commitments but walks on weekends and is active at work, often climbing stairs.      HPI     Annual Exam    Additional comments: Patient doing well. He is exercising some. He is sleeping fairly well. Patient declined vaccines today.      Last edited by Rosas, Joseline E, CMA on 09/07/2024  9:33 AM.       Past Medical History:  Diagnosis Date   Acute stress disorder    Allergic rhinitis    Allergy    Esophageal reflux    Fatty liver    Gout 2016   Hearing loss 2001   Hypercholesterolemia    Hyperlipidemia    Lipoma of abdominal wall    Lumbago    Multiple benign nevi    OSA (obstructive sleep apnea)    CPAP   Past Surgical History:  Procedure Laterality Date   CHOLECYSTECTOMY N/A 09/12/2019   Procedure: LAPAROSCOPIC CHOLECYSTECTOMY;  Surgeon: Marolyn Nest, MD;  Location: [facility] ORS;  Service: General;  Laterality: N/A;   COLONOSCOPY WITH PROPOFOL  N/A 07/27/2019   Procedure: COLONOSCOPY WITH PROPOFOL ;  Surgeon: Janalyn Keene NOVAK, MD;  Location: [facility] ENDOSCOPY;  Service: Endoscopy;  Laterality: N/A;   ESOPHAGOGASTRODUODENOSCOPY (EGD) WITH PROPOFOL  N/A 07/27/2019  Procedure: ESOPHAGOGASTRODUODENOSCOPY (EGD) WITH PROPOFOL ;  Surgeon: Janalyn Keene NOVAK, MD;  Location: [facility] ENDOSCOPY;  Service: Endoscopy;  Laterality: N/A;   HERNIA REPAIR     orchidectomy     left   TONSILLECTOMY     WISDOM TOOTH EXTRACTION     Social History   Socioeconomic History   Marital status: Married    Spouse name: Not on file   Number of children: Not on file   Years of education: HS Grad   Highest education level: GED or equivalent  Occupational History   Occupation: Full-Time    Comment: [employer]  Tobacco Use   Smoking status: Never   Smokeless tobacco: Former    Types: Chew    Quit date:  11/24/2004  Vaping Use   Vaping status: Never Used  Substance and Sexual Activity   Alcohol use: Yes    Alcohol/week: 2.0 standard drinks of alcohol    Types: 2 Cans of beer per week    Comment: occasional   Drug use: No   Sexual activity: Yes  Other Topics Concern   Not on file  Social History Narrative   Not on file   Social Drivers of Health   Financial Resource Strain: Low Risk  (08/07/2023)   Overall Financial Resource Strain (CARDIA)    Difficulty of Paying Living Expenses: Not hard at all  Food Insecurity: No Food Insecurity (09/07/2024)   Hunger Vital Sign    Worried About Running Out of Food in the Last Year: Never true    Ran Out of Food in the Last Year: Never true  Transportation Needs: No Transportation Needs (09/07/2024)   PRAPARE - Administrator, Civil Service (Medical): No    Lack of Transportation (Non-Medical): No  Physical Activity: Insufficiently Active (09/07/2024)   Exercise Vital Sign    Days of Exercise per Week: 3 days    Minutes of Exercise per Session: 30 min  Stress: Stress Concern Present (09/07/2024)   [employer] of Occupational Health - Occupational Stress Questionnaire    Feeling of Stress: To some extent  Social Connections: Socially Integrated (09/07/2024)   Social Connection and Isolation Panel    Frequency of Communication with Friends and Family: More than three times a week    Frequency of Social Gatherings with Friends and Family: More than three times a week    Attends Religious Services: 1 to 4 times per year    Active Member of [employer] or Organizations: Yes    Attends Banker Meetings: Never    Marital Status: Married  Catering manager Violence: Not At Risk (09/07/2024)   Humiliation, Afraid, Rape, and Kick questionnaire    Fear of Current or Ex-Partner: No    Emotionally Abused: No    Physically Abused: No    Sexually Abused: No   Family Status  Relation Name Status   Mother  Deceased at age 60        Emphysema   Father Donnie Ell Deceased at age 32       MI   MGM Cora Powers Deceased   PGF Cossie Bachand Deceased   Sister  Alive   Brother  Alive   Sister  Alive   Sister  Alive   Pat Uncle Teacher, music (Not Specified)   MGF Ed Powers (Not Specified)  No partnership data on file   Family History  Problem Relation Age of Onset   Emphysema Mother    Heart disease Father  Heart attack Father    Cancer Maternal Grandmother        Bone   Cancer Paternal Grandfather    Alcohol abuse Paternal Uncle    Alcohol abuse Maternal Grandfather    Allergies  Allergen Reactions   Niacin Other (See Comments)    Flushing    Patient Care Team: [place] Nancyann BRAVO, MD as PCP - General (Family Medicine)   Medications: Outpatient Medications Prior to Visit  Medication Sig   allopurinol  (ZYLOPRIM ) 100 MG tablet TAKE 1 TABLET BY MOUTH TWICE A DAY   amitriptyline  (ELAVIL ) 25 MG tablet TAKE 1 TABLET BY MOUTH EVERY DAY AT BEDTIME AS NEEDED   fexofenadine (ALLEGRA) 180 MG tablet Take 180 mg by mouth daily.   indomethacin  (INDOCIN ) 50 MG capsule TAKE ONE CAPSULE BY MOUTH TWICE A DAY WITH MEAL AS NEEDED FOR GOUT   pantoprazole  (PROTONIX ) 40 MG tablet TAKE 1 TABLET BY MOUTH EVERY DAY   propranolol  (INDERAL ) 60 MG tablet TAKE 1 TABLET (60 MG TOTAL) BY MOUTH EVERY 8 (EIGHT) HOURS AS NEEDED (FOR ANXIETY).   [DISCONTINUED] ALPRAZolam  (XANAX ) 0.5 MG tablet Take 0.5-1 tablets (0.25-0.5 mg total) by mouth 2 (two) times daily as needed for anxiety.   [DISCONTINUED] ibuprofen  (ADVIL ) 600 MG tablet Take 1 tablet (600 mg total) by mouth every 8 (eight) hours as needed.   No facility-administered medications prior to visit.    Review of Systems  Constitutional:  Negative for appetite change, chills and fever.  Respiratory:  Negative for chest tightness, shortness of breath and wheezing.   Cardiovascular:  Negative for chest pain and palpitations.  Gastrointestinal:  Negative for abdominal pain,  nausea and vomiting.      Objective    BP 132/81 (BP Location: Left Arm, Patient Position: Sitting, Cuff Size: Large)   Pulse 64   Resp 16   Ht 5' 11 (1.803 m)   Wt 256 lb 3.2 oz (116.2 kg)   SpO2 100%   BMI 35.73 kg/m    Physical Exam   General Appearance:    Obese male. Alert, cooperative, in no acute distress, appears stated age  Head:    Normocephalic, without obvious abnormality, atraumatic  Eyes:    PERRL, conjunctiva/corneas clear, EOM's intact, fundi    benign, both eyes       Ears:    Normal TM's and external ear canals, both ears  Nose:   Nares normal, septum midline, mucosa normal, no drainage   or sinus tenderness  Throat:   Lips, mucosa, and tongue normal; teeth and gums normal  Neck:   Supple, symmetrical, trachea midline, no adenopathy;       thyroid :  No enlargement/tenderness/nodules; no carotid   bruit or JVD  Back:     Symmetric, no curvature, ROM normal, no CVA tenderness  Lungs:     Clear to auscultation bilaterally, respirations unlabored  Chest wall:    No tenderness or deformity  Heart:    Normal heart rate. Normal rhythm. No murmurs, rubs, or gallops.  S1 and S2 normal  Abdomen:     Soft, non-tender, bowel sounds active all four quadrants,    no masses, no organomegaly  Genitalia:    deferred  Rectal:    deferred  Extremities:   All extremities are intact. No cyanosis or edema  Pulses:   2+ and symmetric all extremities  Skin:   Skin color, texture, turgor normal, no rashes or lesions  Lymph nodes:   Cervical, supraclavicular, and axillary nodes normal  Neurologic:   CNII-XII intact. Normal strength, sensation and reflexes      throughout       Last depression screening scores    09/07/2024    9:30 AM 08/07/2023   10:52 AM 11/28/2022    8:20 AM  PHQ 2/9 Scores  PHQ - 2 Score 0 0 0  PHQ- 9 Score   2   Last fall risk screening    09/07/2024    9:30 AM  Fall Risk   Falls in the past year? 0  Number falls in past yr: 0  Injury with  Fall? 0  Risk for fall due to : No Fall Risks   Last Audit-C alcohol use screening    09/07/2024    9:35 AM  Alcohol Use Disorder Test (AUDIT)  1. How often do you have a drink containing alcohol? 3  2. How many drinks containing alcohol do you have on a typical day when you are drinking? 0  3. How often do you have six or more drinks on one occasion? 1  AUDIT-C Score 4  4. How often during the last year have you found that you were not able to stop drinking once you had started? 0  5. How often during the last year have you failed to do what was normally expected from you because of drinking? 0  6. How often during the last year have you needed a first drink in the morning to get yourself going after a heavy drinking session? 0  7. How often during the last year have you had a feeling of guilt of remorse after drinking? 0  8. How often during the last year have you been unable to remember what happened the night before because you had been drinking? 0  9. Have you or someone else been injured as a result of your drinking? 0  10. Has a relative or friend or a doctor or another health worker been concerned about your drinking or suggested you cut down? 0  Alcohol Use Disorder Identification Test Final Score (AUDIT) 4   A score of 3 or more in women, and 4 or more in men indicates increased risk for alcohol abuse, EXCEPT if all of the points are from question 1   No results found for any visits on 09/07/24.  Assessment & Plan    Routine Health Maintenance and Physical Exam  Exercise Activities and Dietary recommendations  Goals   None     Immunization History  Administered Date(s) Administered   Hepb-cpg 09/07/2024   Influenza Split 10/29/2012   Tdap 02/26/2010, 10/29/2020   Zoster Recombinant(Shingrix) 10/29/2020    Health Maintenance  Topic Date Due   Pneumococcal Vaccine: 50+ Years (1 of 1 - PCV) Never done   Zoster Vaccines- Shingrix (2 of 2) 12/24/2020   Colonoscopy   07/26/2024   Hepatitis B Vaccines 19-59 Average Risk (2 of 2 - CpG 2-dose series) 10/05/2024   DTaP/Tdap/Td (3 - Td or Tdap) 10/29/2030   Hepatitis C Screening  Completed   HIV Screening  Completed   HPV VACCINES  Aged Out   Meningococcal B Vaccine  Aged Out   Influenza Vaccine  Discontinued   COVID-19 Vaccine  Discontinued    Discussed health benefits of physical activity, and encouraged him to engage in regular exercise appropriate for his age and condition.     Adult Wellness Visit Routine examination with no acute concerns. Discussed general health and lifestyle. - Perform routine blood work including  cholesterol levels.  Gout, stable on allopurinol , recent flare, uric acid monitoring Gout well-managed on allopurinol  100 mg twice daily. Recent flare possibly due to dietary indiscretion. - Check uric acid levels.  Gastroesophageal reflux disease (GERD), stable on pantoprazole  GERD with ongoing pantoprazole .  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  Recurrent pruritic rash due to insect bites, steroid cream prescribed Recurrent pruritic rash due to insect bites. Previous steroid cream effective. - Prescribe triamcinolone cream. - Confirm previous cream name via MyChart for accurate prescription.  Dizziness, episodic, evaluated during visit Episodic dizziness.  Hepatitis B vaccination, initial dose administered Initial dose of Hepatitis B vaccine administered. Informed about the need for a second dose and discussed common side effects. - Schedule second dose at least two months later.  Colorectal polyp, history of, surveillance colonoscopy scheduled Colorectal polyps with previous colonoscopy five years ago. Surveillance colonoscopy scheduled with Dr. Edith for January.           Nancyann Perry, MD  [facility]  Family Practice [phone] (phone) [phone] (fax)  [facility]

## 2024-09-08 ENCOUNTER — Ambulatory Visit: Payer: Self-pay | Admitting: Family Medicine

## 2024-09-08 DIAGNOSIS — R7401 Elevation of levels of liver transaminase levels: Secondary | ICD-10-CM

## 2024-09-08 LAB — COMPREHENSIVE METABOLIC PANEL WITH GFR
ALT: 50 IU/L — ABNORMAL HIGH (ref 0–44)
AST: 58 IU/L — ABNORMAL HIGH (ref 0–40)
Albumin: 4.4 g/dL (ref 3.8–4.9)
Alkaline Phosphatase: 46 IU/L — ABNORMAL LOW (ref 47–123)
BUN/Creatinine Ratio: 16 (ref 9–20)
BUN: 16 mg/dL (ref 6–24)
Bilirubin Total: 0.6 mg/dL (ref 0.0–1.2)
CO2: 25 mmol/L (ref 20–29)
Calcium: 9.6 mg/dL (ref 8.7–10.2)
Chloride: 102 mmol/L (ref 96–106)
Creatinine, Ser: 0.98 mg/dL (ref 0.76–1.27)
Globulin, Total: 2.2 g/dL (ref 1.5–4.5)
Glucose: 91 mg/dL (ref 70–99)
Potassium: 5 mmol/L (ref 3.5–5.2)
Sodium: 141 mmol/L (ref 134–144)
Total Protein: 6.6 g/dL (ref 6.0–8.5)
eGFR: 91 mL/min/1.73 (ref >59)

## 2024-09-08 LAB — LIPID PANEL
Chol/HDL Ratio: 4.3 ratio (ref 0.0–5.0)
Cholesterol, Total: 222 mg/dL — ABNORMAL HIGH (ref 100–199)
HDL: 52 mg/dL (ref >39)
LDL Chol Calc (NIH): 131 mg/dL — ABNORMAL HIGH (ref 0–99)
Triglycerides: 222 mg/dL — ABNORMAL HIGH (ref 0–149)
VLDL Cholesterol Cal: 39 mg/dL (ref 5–40)

## 2024-09-08 LAB — PSA TOTAL (REFLEX TO FREE): Prostate Specific Ag, Serum: 0.6 ng/mL (ref 0.0–4.0)

## 2024-09-08 LAB — URIC ACID: Uric Acid: 4.9 mg/dL (ref 3.8–8.4)

## 2024-09-22 ENCOUNTER — Ambulatory Visit
Admission: RE | Admit: 2024-09-22 | Discharge: 2024-09-22 | Disposition: A | Discharge status: Home / Self Care | Source: Ambulatory Visit | Attending: Family Medicine | Admitting: Family Medicine

## 2024-09-22 DIAGNOSIS — R1011 Right upper quadrant pain: Secondary | ICD-10-CM | POA: Diagnosis not present

## 2024-09-22 DIAGNOSIS — K7689 Other specified diseases of liver: Secondary | ICD-10-CM | POA: Diagnosis not present

## 2024-09-22 DIAGNOSIS — R7401 Elevation of levels of liver transaminase levels: Secondary | ICD-10-CM | POA: Diagnosis not present

## 2024-09-22 DIAGNOSIS — Z9049 Acquired absence of other specified parts of digestive tract: Secondary | ICD-10-CM | POA: Diagnosis not present

## 2024-09-23 ENCOUNTER — Ambulatory Visit: Payer: Self-pay | Admitting: Family Medicine

## 2024-09-23 DIAGNOSIS — K760 Fatty (change of) liver, not elsewhere classified: Secondary | ICD-10-CM

## 2024-09-23 DIAGNOSIS — E782 Mixed hyperlipidemia: Secondary | ICD-10-CM

## 2024-11-18 ENCOUNTER — Telehealth: Payer: Self-pay | Admitting: Gastroenterology

## 2024-11-18 NOTE — Telephone Encounter (Signed)
 Patient called regarding insurance verification for his upcoming procedure scheduled on 12/09/24. Patient wanted to confirm that his insurance coverage is active and accepted for the procedure.

## 2024-11-25 ENCOUNTER — Other Ambulatory Visit: Payer: Self-pay | Admitting: Family Medicine

## 2024-11-25 DIAGNOSIS — K219 Gastro-esophageal reflux disease without esophagitis: Secondary | ICD-10-CM

## 2024-11-28 ENCOUNTER — Other Ambulatory Visit: Payer: Self-pay | Admitting: Family Medicine

## 2024-11-28 DIAGNOSIS — F439 Reaction to severe stress, unspecified: Secondary | ICD-10-CM

## 2024-12-02 ENCOUNTER — Other Ambulatory Visit: Payer: Self-pay

## 2024-12-02 ENCOUNTER — Encounter: Payer: Self-pay | Admitting: Gastroenterology

## 2024-12-05 ENCOUNTER — Ambulatory Visit: Admitting: Family Medicine

## 2024-12-05 ENCOUNTER — Encounter: Payer: Self-pay | Admitting: Anesthesiology

## 2024-12-07 ENCOUNTER — Telehealth: Payer: Self-pay | Admitting: Gastroenterology

## 2024-12-07 NOTE — Telephone Encounter (Signed)
 The patient called requesting instructions for his procedure scheduled for Friday. An attempt was made to guide him through MyChart; however, he reported that there were no letters on file. The call was transferred to [facility] for further assistance.

## 2024-12-09 ENCOUNTER — Encounter: Admission: AD | Payer: Self-pay | Source: Home / Self Care

## 2024-12-09 ENCOUNTER — Ambulatory Visit: Admission: AD | Admit: 2024-12-09 | Payer: Self-pay | Source: Home / Self Care | Admitting: Gastroenterology

## 2024-12-09 SURGERY — COLONOSCOPY
Anesthesia: General

## 2024-12-12 ENCOUNTER — Ambulatory Visit: Admitting: Family Medicine

## 2024-12-14 ENCOUNTER — Ambulatory Visit: Admitting: Family Medicine
